# Patient Record
Sex: Female | Born: 2000 | Race: White | Hispanic: No | Marital: Single | Smoking: Never smoker
Health system: Southern US, Community
[De-identification: ages and names within clinical notes are randomized; demographics above are authoritative.]

## PROBLEM LIST (undated history)

## (undated) DIAGNOSIS — F502 Bulimia nervosa, unspecified: Secondary | ICD-10-CM

## (undated) DIAGNOSIS — E538 Deficiency of other specified B group vitamins: Secondary | ICD-10-CM

## (undated) DIAGNOSIS — F32A Depression, unspecified: Secondary | ICD-10-CM

## (undated) DIAGNOSIS — F419 Anxiety disorder, unspecified: Secondary | ICD-10-CM

## (undated) DIAGNOSIS — J45909 Unspecified asthma, uncomplicated: Secondary | ICD-10-CM

## (undated) DIAGNOSIS — R630 Anorexia: Secondary | ICD-10-CM

## (undated) DIAGNOSIS — F909 Attention-deficit hyperactivity disorder, unspecified type: Secondary | ICD-10-CM

## (undated) DIAGNOSIS — F329 Major depressive disorder, single episode, unspecified: Secondary | ICD-10-CM

## (undated) HISTORY — DX: Major depressive disorder, single episode, unspecified: F32.9

## (undated) HISTORY — DX: Unspecified asthma, uncomplicated: J45.909

## (undated) HISTORY — PX: ADENOIDECTOMY: SUR15

## (undated) HISTORY — DX: Morbid (severe) obesity due to excess calories: E66.01

## (undated) HISTORY — DX: Deficiency of other specified B group vitamins: E53.8

## (undated) HISTORY — DX: Anorexia: R63.0

## (undated) HISTORY — DX: Bulimia nervosa, unspecified: F50.20

## (undated) HISTORY — PX: TONSILLECTOMY: SUR1361

## (undated) HISTORY — DX: Attention-deficit hyperactivity disorder, unspecified type: F90.9

## (undated) HISTORY — DX: Depression, unspecified: F32.A

## (undated) HISTORY — DX: Anxiety disorder, unspecified: F41.9

## (undated) HISTORY — PX: LIGAMENT REPAIR: SHX5444

## (undated) HISTORY — DX: Bulimia nervosa: F50.2

## (undated) HISTORY — PX: BLADDER REPAIR: SHX76

---
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
## 2018-01-04 DIAGNOSIS — M545 Low back pain: Secondary | ICD-10-CM | POA: Diagnosis not present

## 2018-01-04 DIAGNOSIS — M25551 Pain in right hip: Secondary | ICD-10-CM | POA: Diagnosis not present

## 2018-01-04 DIAGNOSIS — M25552 Pain in left hip: Secondary | ICD-10-CM | POA: Diagnosis not present

## 2018-01-11 ENCOUNTER — Other Ambulatory Visit (INDEPENDENT_AMBULATORY_CARE_PROVIDER_SITE_OTHER): Payer: Self-pay | Admitting: Family Medicine

## 2018-01-11 DIAGNOSIS — M545 Low back pain: Secondary | ICD-10-CM | POA: Diagnosis not present

## 2018-01-11 DIAGNOSIS — M25552 Pain in left hip: Secondary | ICD-10-CM | POA: Diagnosis not present

## 2018-01-11 DIAGNOSIS — M25551 Pain in right hip: Secondary | ICD-10-CM | POA: Diagnosis not present

## 2018-01-15 DIAGNOSIS — M25552 Pain in left hip: Secondary | ICD-10-CM | POA: Diagnosis not present

## 2018-01-15 DIAGNOSIS — M25551 Pain in right hip: Secondary | ICD-10-CM | POA: Diagnosis not present

## 2018-01-15 DIAGNOSIS — M545 Low back pain: Secondary | ICD-10-CM | POA: Diagnosis not present

## 2018-01-18 DIAGNOSIS — F5001 Anorexia nervosa, restricting type: Secondary | ICD-10-CM | POA: Diagnosis not present

## 2018-01-19 DIAGNOSIS — M25552 Pain in left hip: Secondary | ICD-10-CM | POA: Diagnosis not present

## 2018-01-19 DIAGNOSIS — M25551 Pain in right hip: Secondary | ICD-10-CM | POA: Diagnosis not present

## 2018-01-19 DIAGNOSIS — M545 Low back pain: Secondary | ICD-10-CM | POA: Diagnosis not present

## 2018-01-21 DIAGNOSIS — M545 Low back pain: Secondary | ICD-10-CM | POA: Diagnosis not present

## 2018-01-21 DIAGNOSIS — M25552 Pain in left hip: Secondary | ICD-10-CM | POA: Diagnosis not present

## 2018-01-21 DIAGNOSIS — M25551 Pain in right hip: Secondary | ICD-10-CM | POA: Diagnosis not present

## 2018-01-21 DIAGNOSIS — Z713 Dietary counseling and surveillance: Secondary | ICD-10-CM | POA: Diagnosis not present

## 2018-01-21 DIAGNOSIS — M6281 Muscle weakness (generalized): Secondary | ICD-10-CM | POA: Diagnosis not present

## 2018-01-28 DIAGNOSIS — M6281 Muscle weakness (generalized): Secondary | ICD-10-CM | POA: Diagnosis not present

## 2018-01-28 DIAGNOSIS — M25551 Pain in right hip: Secondary | ICD-10-CM | POA: Diagnosis not present

## 2018-01-28 DIAGNOSIS — M25552 Pain in left hip: Secondary | ICD-10-CM | POA: Diagnosis not present

## 2018-01-28 DIAGNOSIS — M545 Low back pain: Secondary | ICD-10-CM | POA: Diagnosis not present

## 2018-01-29 ENCOUNTER — Telehealth: Payer: Self-pay

## 2018-01-29 NOTE — Telephone Encounter (Signed)
Patient called asking for refill of lisinopril. Pt should have 3 refills on file at the pharmacy. Called number on file. No answer, no VM option avail. If patient calls back-please relay to patient.

## 2018-02-01 DIAGNOSIS — M6281 Muscle weakness (generalized): Secondary | ICD-10-CM | POA: Diagnosis not present

## 2018-02-01 DIAGNOSIS — M545 Low back pain: Secondary | ICD-10-CM | POA: Diagnosis not present

## 2018-02-01 DIAGNOSIS — M25551 Pain in right hip: Secondary | ICD-10-CM | POA: Diagnosis not present

## 2018-02-01 DIAGNOSIS — M25552 Pain in left hip: Secondary | ICD-10-CM | POA: Diagnosis not present

## 2018-02-10 ENCOUNTER — Ambulatory Visit: Payer: BLUE CROSS/BLUE SHIELD | Admitting: Sports Medicine

## 2018-02-11 DIAGNOSIS — F5001 Anorexia nervosa, restricting type: Secondary | ICD-10-CM | POA: Diagnosis not present

## 2018-02-16 DIAGNOSIS — S90851A Superficial foreign body, right foot, initial encounter: Secondary | ICD-10-CM | POA: Diagnosis not present

## 2018-02-19 DIAGNOSIS — M6281 Muscle weakness (generalized): Secondary | ICD-10-CM | POA: Diagnosis not present

## 2018-02-19 DIAGNOSIS — M545 Low back pain: Secondary | ICD-10-CM | POA: Diagnosis not present

## 2018-02-19 DIAGNOSIS — M25552 Pain in left hip: Secondary | ICD-10-CM | POA: Diagnosis not present

## 2018-02-19 DIAGNOSIS — M25551 Pain in right hip: Secondary | ICD-10-CM | POA: Diagnosis not present

## 2018-02-19 DIAGNOSIS — F5001 Anorexia nervosa, restricting type: Secondary | ICD-10-CM | POA: Diagnosis not present

## 2018-02-23 DIAGNOSIS — Z713 Dietary counseling and surveillance: Secondary | ICD-10-CM | POA: Diagnosis not present

## 2018-02-25 DIAGNOSIS — M6281 Muscle weakness (generalized): Secondary | ICD-10-CM | POA: Diagnosis not present

## 2018-02-25 DIAGNOSIS — M25551 Pain in right hip: Secondary | ICD-10-CM | POA: Diagnosis not present

## 2018-02-25 DIAGNOSIS — M545 Low back pain: Secondary | ICD-10-CM | POA: Diagnosis not present

## 2018-02-25 DIAGNOSIS — M25552 Pain in left hip: Secondary | ICD-10-CM | POA: Diagnosis not present

## 2018-02-26 DIAGNOSIS — F5001 Anorexia nervosa, restricting type: Secondary | ICD-10-CM | POA: Diagnosis not present

## 2018-02-26 DIAGNOSIS — M545 Low back pain: Secondary | ICD-10-CM | POA: Diagnosis not present

## 2018-02-26 DIAGNOSIS — M25552 Pain in left hip: Secondary | ICD-10-CM | POA: Diagnosis not present

## 2018-02-26 DIAGNOSIS — M6281 Muscle weakness (generalized): Secondary | ICD-10-CM | POA: Diagnosis not present

## 2018-02-26 DIAGNOSIS — M25551 Pain in right hip: Secondary | ICD-10-CM | POA: Diagnosis not present

## 2018-03-02 DIAGNOSIS — M25551 Pain in right hip: Secondary | ICD-10-CM | POA: Diagnosis not present

## 2018-03-02 DIAGNOSIS — M25552 Pain in left hip: Secondary | ICD-10-CM | POA: Diagnosis not present

## 2018-03-02 DIAGNOSIS — M6281 Muscle weakness (generalized): Secondary | ICD-10-CM | POA: Diagnosis not present

## 2018-03-02 DIAGNOSIS — M545 Low back pain: Secondary | ICD-10-CM | POA: Diagnosis not present

## 2018-03-04 ENCOUNTER — Encounter: Payer: Self-pay | Admitting: Pediatrics

## 2018-03-04 ENCOUNTER — Ambulatory Visit (INDEPENDENT_AMBULATORY_CARE_PROVIDER_SITE_OTHER): Payer: BLUE CROSS/BLUE SHIELD | Admitting: Pediatrics

## 2018-03-04 VITALS — BP 144/81 | HR 93 | Ht 61.0 in | Wt 265.0 lb

## 2018-03-04 DIAGNOSIS — I10 Essential (primary) hypertension: Secondary | ICD-10-CM

## 2018-03-04 DIAGNOSIS — F902 Attention-deficit hyperactivity disorder, combined type: Secondary | ICD-10-CM | POA: Diagnosis not present

## 2018-03-04 DIAGNOSIS — F5002 Anorexia nervosa, binge eating/purging type: Secondary | ICD-10-CM

## 2018-03-04 DIAGNOSIS — N946 Dysmenorrhea, unspecified: Secondary | ICD-10-CM

## 2018-03-04 DIAGNOSIS — F4323 Adjustment disorder with mixed anxiety and depressed mood: Secondary | ICD-10-CM | POA: Diagnosis not present

## 2018-03-04 MED ORDER — BUSPIRONE HCL 5 MG PO TABS: 5.0000 mg | ORAL_TABLET | Freq: Three times a day (TID) | ORAL | 1 refills | Status: DC

## 2018-03-04 MED ORDER — METHYLPHENIDATE HCL ER (OSM) 18 MG PO TBCR: 18.0000 mg | EXTENDED_RELEASE_TABLET | Freq: Every day | ORAL | 0 refills | Status: DC

## 2018-03-04 NOTE — Patient Instructions (Addendum)
Adult ADHD Symptoms  Impulsiveness Disorganization and problems prioritizing Poor time management skills Problems focusing on a task Trouble multitasking Excessive activity or restlessness Poor planning Low frustration tolerance Frequent mood swings Problems following through and completing tasks Hot temper Trouble coping with stress  buspar 5 mg three times daily

## 2018-03-04 NOTE — Progress Notes (Addendum)
THIS RECORD MAY CONTAIN CONFIDENTIAL INFORMATION THAT SHOULD NOT BE RELEASED WITHOUT REVIEW OF THE SERVICE PROVIDER.  Adolescent Medicine Consultation Follow-Up Visit Emily [name]  is a 18 y.o. female referred by Geoffry ParadiseAronson, Richard, MD here today for follow-up regarding hypertension, ADHD, anorexia.    Plan at last adolescent specialty clinic  visit included continue lisinopril, continue concerta PRN, continue effexor daily, continue with dietician and therpist, follow with urology for chronic interstitial cystitis.  Pertinent Labs? No Growth Chart Viewed? yes   History was provided by the patient.  Interpreter? no  No chief complaint on file.  HPI:    Her eating behaviors have been up and down in the past few months but recently she has been "falling back into her old habits" of restricting. She is incredibly busy with school and work (takes classes at both [employer] and [employer] and works as Industrial/product designerbabysitter and pet sitter), and staying so busy makes it hard to take a break to eat.  24 h recall:  B: bagel with cream cheese L: skipped today, normally has 1 cup chex mix D: often later at night, sometimes overeats at this meal - ex. last night ate a lot of chicken and potatoes  She does not have much support at home regarding her eating disorder - family and friends have their own issues and she feels alone with this burden. Is grateful for nutrition, therapist and adolescent clinic who have helped her.  With her busy schedule, she feels increased anxiety and low mood. Feels like all of these commitments are "piling up". She "feels like a robot" and finds herself turning down fun activities with friends because she is so busy that when she has a free minute she likes to spend it by herself. She would like to try a new medication for anxiety.  She has purged a few times in the past few months but none recently.  She is sleeping about 5-6 hours per night.  She has not been taking  lisinopril regularly because it causes heartburn when not taken regularly for her.  No LMP recorded. (Menstrual status: Oral contraceptives). Allergies  Allergen Reactions  . Dust Mite Extract Other (See Comments)    Sneezing, coughing, runny nose Sneezing, coughing, runny nose    Current Outpatient Medications on File Prior to Visit  Medication Sig Dispense Refill  . hydrOXYzine (ATARAX/VISTARIL) 25 MG tablet Take by mouth.    . levonorgestrel-ethinyl estradiol (LILLOW) 0.15-30 MG-MCG tablet TAKE 1 TABLET DAILY BY MOUTH. SKIP PLACEBO PILLS. CONTINUOUS CYCLE FOR 3 MONTHS. 112 tablet 3  . lisinopril (PRINIVIL) 10 MG tablet Take 1 tablet (10 mg total) by mouth daily. 30 tablet 3  . methylphenidate (CONCERTA) 18 MG PO CR tablet Take 1 tablet (18 mg total) by mouth daily. 30 tablet 0  . naproxen (NAPROSYN) 500 MG tablet TAKE 1 TABLET BY MOUTH TWICE A DAY AS NEEDED 60 tablet 1  . omega-3 acid ethyl esters (LOVAZA) 1 g capsule TAKE 1 CAPSULE (1 G TOTAL) BY MOUTH 2 (TWO) TIMES DAILY. 180 capsule 1  . pantoprazole (PROTONIX) 40 MG tablet Take 1 tablet (40 mg total) by mouth daily. 90 tablet 1  . traZODone (DESYREL) 50 MG tablet 1-2    . venlafaxine XR (EFFEXOR-XR) 150 MG 24 hr capsule TAKE 1 CAPSULE BY MOUTH EVERY DAY 90 capsule 1   No current facility-administered medications on file prior to visit.     Patient Active Problem List   Diagnosis Date Noted  .  Acute left-sided low back pain without sciatica 12/09/2017  . Chronic left-sided low back pain without sciatica 12/02/2017  . Interstitial cystitis (chronic) with hematuria 11/26/2017  . 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 Hypertension 03/25/2017  . Midline thoracic back pain 09/27/2015  . Acne vulgaris 09/27/2015  . Anorexia nervosa, binge-eating purging type 04/11/2015  . Dysmenorrhea 02/27/2015  . Sleep disturbance 11/18/2014  . ADHD (attention deficit hyperactivity disorder) 08/24/2014  . Adjustment disorder with mixed  anxiety and depressed mood 08/23/2014   Review of Systems  Cardiovascular: Negative for chest pain.  Gastrointestinal: Positive for heartburn. Negative for abdominal pain, constipation and diarrhea.  Genitourinary: Positive for frequency and urgency.  Neurological: Positive for headaches (likely due to lack of sleep and not eating).   Social History: Changes with school since last visit?  no  Tobacco?  no Drugs/ETOH?  no  Physical Exam:  Vitals:   03/04/18 1342  BP: (!) 144/81  Pulse: 93  Weight: 265 lb (120.2 kg)  Height: 5\' 1"  (1.549 m)   BP (!) 144/81   Pulse 93   Ht 5\' 1"  (1.549 m)   Wt 265 lb (120.2 kg)   BMI 50.07 kg/m  Body mass index: body mass index is 50.07 kg/m. Blood pressure percentiles are not available for patients who are 18 years or older.  Physical Exam Constitutional:      General: She is not in acute distress.    Appearance: Normal appearance. She is obese.  HENT:     Head: Normocephalic and atraumatic.     Nose: Nose normal.     Mouth/Throat:     Mouth: Mucous membranes are moist.     Pharynx: Oropharynx is clear.  Eyes:     Conjunctiva/sclera: Conjunctivae normal.     Pupils: Pupils are equal, round, and reactive to light.  Neck:     Musculoskeletal: Neck supple.  Cardiovascular:     Rate and Rhythm: Normal rate and regular rhythm.     Pulses: Normal pulses.     Heart sounds: No murmur.  Pulmonary:     Effort: Pulmonary effort is normal.     Breath sounds: Normal breath sounds.  Abdominal:     General: There is no distension.     Palpations: Abdomen is soft.     Tenderness: There is no abdominal tenderness.  Musculoskeletal: Normal range of motion.  Lymphadenopathy:     Cervical: No cervical adenopathy.  Skin:    General: Skin is warm.     Findings: No rash.  Neurological:     General: No focal deficit present.     Mental Status: She is alert.  Psychiatric:        Mood and Affect: Mood is anxious.     Assessment/Plan:  1.  Essential hypertension - Discussed risks of hypertension and importance of taking lisinopril regularly. Offered to change medications given her report of heartburn with lisinopril but Tilla states that when she takes it regularly she doesn't get heartburn  2. Dysmenorrhea - Continue OCPs with continuous cycling  3. Adjustment disorder with mixed anxiety and depressed mood - Continue effexor - Will start buspar to see if this helps anxiety - busPIRone (BUSPAR) 5 MG tablet; Take 1 tablet (5 mg total) by mouth 3 (three) times daily.  Dispense: 90 tablet; Refill: 1  4. Attention deficit hyperactivity disorder (ADHD), combined type - Discussed that she should take concerta more regularly since uncontrolled ADHD could contribute to some of the problems she is describing  with her schedule and anxiety - methylphenidate (CONCERTA) 18 MG PO CR tablet; Take 1 tablet (18 mg total) by mouth daily.  Dispense: 30 tablet; Refill: 0  5. Anorexia nervosa, binge-eating purging type - Has gained 10 pounds since last visit. Is restricting all day and overeating at dinner.  - Continue with nutrition and counseling   BH screenings: PHQSADS reviewed and indicated mild-moderately elevated scores. Screens discussed with patient and parent and adjustments to plan made accordingly.    Follow-up: 4 weeks  Medical decision-making:  >30 minutes spent face to face with patient with more than 50% of appointment spent discussing diagnosis, management, follow-up, and reviewing of medical record.  Randolm Idol, MD

## 2018-03-05 DIAGNOSIS — M25551 Pain in right hip: Secondary | ICD-10-CM | POA: Diagnosis not present

## 2018-03-05 DIAGNOSIS — M25552 Pain in left hip: Secondary | ICD-10-CM | POA: Diagnosis not present

## 2018-03-05 DIAGNOSIS — M6281 Muscle weakness (generalized): Secondary | ICD-10-CM | POA: Diagnosis not present

## 2018-03-05 DIAGNOSIS — M545 Low back pain: Secondary | ICD-10-CM | POA: Diagnosis not present

## 2018-03-06 ENCOUNTER — Other Ambulatory Visit: Payer: Self-pay | Admitting: Pediatrics

## 2018-03-06 DIAGNOSIS — K219 Gastro-esophageal reflux disease without esophagitis: Secondary | ICD-10-CM

## 2018-03-08 DIAGNOSIS — F5001 Anorexia nervosa, restricting type: Secondary | ICD-10-CM | POA: Diagnosis not present

## 2018-03-08 NOTE — Progress Notes (Signed)
I have reviewed the resident's note and plan of care and helped develop the plan as necessary.  Restart lisinopril. Discussed the manifestations of untreated adult ADHD and the potential benefits to her of treating it daily along with her tx for anxiety and depression. She was in agreement with restarting her concerta. Will f/u in 1 month to assess progress with eating behaviors and monitor BP.

## 2018-03-09 DIAGNOSIS — Z713 Dietary counseling and surveillance: Secondary | ICD-10-CM | POA: Diagnosis not present

## 2018-03-10 ENCOUNTER — Telehealth: Payer: Self-pay

## 2018-03-10 NOTE — Telephone Encounter (Signed)
Rec'd notification from pharmacy stating insurance no longer pays for omega-3 acid ethyl esters (LOVAZA) 1 g capsule and did not give alternative.Patient requests new RX.

## 2018-03-11 ENCOUNTER — Other Ambulatory Visit: Payer: Self-pay | Admitting: Pediatrics

## 2018-03-11 ENCOUNTER — Telehealth: Payer: Self-pay | Admitting: *Deleted

## 2018-03-11 NOTE — Telephone Encounter (Signed)
Called patient and let her know.  

## 2018-03-11 NOTE — Telephone Encounter (Signed)
No other rx alternatives... she can get OTC fish oil that has at least 1000 mg of omega 3 and take twice daily.

## 2018-03-11 NOTE — Telephone Encounter (Signed)
Called number on file, no answer, no VM option avail. Relayed message to pharmacy to make patient aware.

## 2018-03-11 NOTE — Telephone Encounter (Signed)
Patient calling for refill for protonix. Is out of medicine.

## 2018-03-11 NOTE — Telephone Encounter (Signed)
Sent to pharmacy earlier in the week- 90 with 1 refill.

## 2018-03-12 ENCOUNTER — Other Ambulatory Visit (INDEPENDENT_AMBULATORY_CARE_PROVIDER_SITE_OTHER): Payer: Self-pay | Admitting: Family Medicine

## 2018-03-12 DIAGNOSIS — M25552 Pain in left hip: Secondary | ICD-10-CM | POA: Diagnosis not present

## 2018-03-12 DIAGNOSIS — M545 Low back pain: Secondary | ICD-10-CM | POA: Diagnosis not present

## 2018-03-12 DIAGNOSIS — M6281 Muscle weakness (generalized): Secondary | ICD-10-CM | POA: Diagnosis not present

## 2018-03-12 DIAGNOSIS — M25551 Pain in right hip: Secondary | ICD-10-CM | POA: Diagnosis not present

## 2018-03-18 DIAGNOSIS — M6281 Muscle weakness (generalized): Secondary | ICD-10-CM | POA: Diagnosis not present

## 2018-03-18 DIAGNOSIS — M25551 Pain in right hip: Secondary | ICD-10-CM | POA: Diagnosis not present

## 2018-03-18 DIAGNOSIS — M545 Low back pain: Secondary | ICD-10-CM | POA: Diagnosis not present

## 2018-03-18 DIAGNOSIS — M25552 Pain in left hip: Secondary | ICD-10-CM | POA: Diagnosis not present

## 2018-03-19 ENCOUNTER — Other Ambulatory Visit: Payer: Self-pay | Admitting: Pediatrics

## 2018-03-19 DIAGNOSIS — N946 Dysmenorrhea, unspecified: Secondary | ICD-10-CM

## 2018-03-19 DIAGNOSIS — E782 Mixed hyperlipidemia: Secondary | ICD-10-CM

## 2018-03-22 DIAGNOSIS — F5001 Anorexia nervosa, restricting type: Secondary | ICD-10-CM | POA: Diagnosis not present

## 2018-03-26 ENCOUNTER — Other Ambulatory Visit: Payer: Self-pay | Admitting: Pediatrics

## 2018-03-26 DIAGNOSIS — F4323 Adjustment disorder with mixed anxiety and depressed mood: Secondary | ICD-10-CM

## 2018-04-01 ENCOUNTER — Ambulatory Visit: Payer: Self-pay | Admitting: Pediatrics

## 2018-04-01 DIAGNOSIS — M25551 Pain in right hip: Secondary | ICD-10-CM | POA: Diagnosis not present

## 2018-04-01 DIAGNOSIS — M25552 Pain in left hip: Secondary | ICD-10-CM | POA: Diagnosis not present

## 2018-04-01 DIAGNOSIS — M545 Low back pain: Secondary | ICD-10-CM | POA: Diagnosis not present

## 2018-04-01 DIAGNOSIS — M6281 Muscle weakness (generalized): Secondary | ICD-10-CM | POA: Diagnosis not present

## 2018-04-05 DIAGNOSIS — F5002 Anorexia nervosa, binge eating/purging type: Secondary | ICD-10-CM | POA: Diagnosis not present

## 2018-04-12 DIAGNOSIS — M25552 Pain in left hip: Secondary | ICD-10-CM | POA: Diagnosis not present

## 2018-04-12 DIAGNOSIS — M545 Low back pain: Secondary | ICD-10-CM | POA: Diagnosis not present

## 2018-04-12 DIAGNOSIS — M25551 Pain in right hip: Secondary | ICD-10-CM | POA: Diagnosis not present

## 2018-04-12 DIAGNOSIS — M6281 Muscle weakness (generalized): Secondary | ICD-10-CM | POA: Diagnosis not present

## 2018-04-13 DIAGNOSIS — Z713 Dietary counseling and surveillance: Secondary | ICD-10-CM | POA: Diagnosis not present

## 2018-04-13 DIAGNOSIS — F4323 Adjustment disorder with mixed anxiety and depressed mood: Secondary | ICD-10-CM | POA: Diagnosis not present

## 2018-04-19 DIAGNOSIS — F5002 Anorexia nervosa, binge eating/purging type: Secondary | ICD-10-CM | POA: Diagnosis not present

## 2018-04-20 DIAGNOSIS — F4323 Adjustment disorder with mixed anxiety and depressed mood: Secondary | ICD-10-CM | POA: Diagnosis not present

## 2018-04-27 DIAGNOSIS — F4323 Adjustment disorder with mixed anxiety and depressed mood: Secondary | ICD-10-CM | POA: Diagnosis not present

## 2018-05-03 DIAGNOSIS — F5002 Anorexia nervosa, binge eating/purging type: Secondary | ICD-10-CM | POA: Diagnosis not present

## 2018-05-04 ENCOUNTER — Other Ambulatory Visit: Payer: Self-pay | Admitting: Pediatrics

## 2018-05-04 DIAGNOSIS — I10 Essential (primary) hypertension: Secondary | ICD-10-CM

## 2018-05-10 DIAGNOSIS — Z713 Dietary counseling and surveillance: Secondary | ICD-10-CM | POA: Diagnosis not present

## 2018-05-11 ENCOUNTER — Other Ambulatory Visit (INDEPENDENT_AMBULATORY_CARE_PROVIDER_SITE_OTHER): Payer: Self-pay | Admitting: Family Medicine

## 2018-05-17 DIAGNOSIS — F5002 Anorexia nervosa, binge eating/purging type: Secondary | ICD-10-CM | POA: Diagnosis not present

## 2018-05-26 DIAGNOSIS — L02214 Cutaneous abscess of groin: Secondary | ICD-10-CM | POA: Diagnosis not present

## 2018-05-31 DIAGNOSIS — F5002 Anorexia nervosa, binge eating/purging type: Secondary | ICD-10-CM | POA: Diagnosis not present

## 2018-06-08 DIAGNOSIS — Z0000 Encounter for general adult medical examination without abnormal findings: Secondary | ICD-10-CM | POA: Diagnosis not present

## 2018-06-08 DIAGNOSIS — Z713 Dietary counseling and surveillance: Secondary | ICD-10-CM | POA: Diagnosis not present

## 2018-06-14 DIAGNOSIS — R82998 Other abnormal findings in urine: Secondary | ICD-10-CM | POA: Diagnosis not present

## 2018-06-14 DIAGNOSIS — F5000 Anorexia nervosa, unspecified: Secondary | ICD-10-CM | POA: Diagnosis not present

## 2018-06-14 DIAGNOSIS — R030 Elevated blood-pressure reading, without diagnosis of hypertension: Secondary | ICD-10-CM | POA: Diagnosis not present

## 2018-06-14 DIAGNOSIS — R7989 Other specified abnormal findings of blood chemistry: Secondary | ICD-10-CM | POA: Diagnosis not present

## 2018-06-14 DIAGNOSIS — Z0000 Encounter for general adult medical examination without abnormal findings: Secondary | ICD-10-CM | POA: Diagnosis not present

## 2018-06-14 DIAGNOSIS — R7301 Impaired fasting glucose: Secondary | ICD-10-CM | POA: Diagnosis not present

## 2018-06-14 DIAGNOSIS — F5002 Anorexia nervosa, binge eating/purging type: Secondary | ICD-10-CM | POA: Diagnosis not present

## 2018-06-14 DIAGNOSIS — Z1331 Encounter for screening for depression: Secondary | ICD-10-CM | POA: Diagnosis not present

## 2018-06-25 ENCOUNTER — Other Ambulatory Visit: Payer: Self-pay | Admitting: Pediatrics

## 2018-06-25 DIAGNOSIS — F4323 Adjustment disorder with mixed anxiety and depressed mood: Secondary | ICD-10-CM

## 2018-06-28 DIAGNOSIS — F5002 Anorexia nervosa, binge eating/purging type: Secondary | ICD-10-CM | POA: Diagnosis not present

## 2018-07-11 ENCOUNTER — Other Ambulatory Visit (INDEPENDENT_AMBULATORY_CARE_PROVIDER_SITE_OTHER): Payer: Self-pay | Admitting: Family Medicine

## 2018-07-12 DIAGNOSIS — F5002 Anorexia nervosa, binge eating/purging type: Secondary | ICD-10-CM | POA: Diagnosis not present

## 2018-07-23 DIAGNOSIS — F5002 Anorexia nervosa, binge eating/purging type: Secondary | ICD-10-CM | POA: Diagnosis not present

## 2018-08-09 DIAGNOSIS — F5002 Anorexia nervosa, binge eating/purging type: Secondary | ICD-10-CM | POA: Diagnosis not present

## 2018-08-23 DIAGNOSIS — F5002 Anorexia nervosa, binge eating/purging type: Secondary | ICD-10-CM | POA: Diagnosis not present

## 2018-09-06 DIAGNOSIS — F5002 Anorexia nervosa, binge eating/purging type: Secondary | ICD-10-CM | POA: Diagnosis not present

## 2018-09-16 ENCOUNTER — Other Ambulatory Visit: Payer: Self-pay | Admitting: Pediatrics

## 2018-09-16 DIAGNOSIS — K219 Gastro-esophageal reflux disease without esophagitis: Secondary | ICD-10-CM

## 2018-09-16 DIAGNOSIS — H04123 Dry eye syndrome of bilateral lacrimal glands: Secondary | ICD-10-CM | POA: Diagnosis not present

## 2018-09-18 ENCOUNTER — Other Ambulatory Visit: Payer: Self-pay | Admitting: Pediatrics

## 2018-09-18 DIAGNOSIS — G8929 Other chronic pain: Secondary | ICD-10-CM

## 2018-09-18 DIAGNOSIS — M545 Low back pain, unspecified: Secondary | ICD-10-CM

## 2018-09-23 DIAGNOSIS — F5002 Anorexia nervosa, binge eating/purging type: Secondary | ICD-10-CM | POA: Diagnosis not present

## 2018-10-05 DIAGNOSIS — F5002 Anorexia nervosa, binge eating/purging type: Secondary | ICD-10-CM | POA: Diagnosis not present

## 2018-10-11 ENCOUNTER — Ambulatory Visit: Payer: Self-pay | Admitting: Pediatrics

## 2018-10-14 ENCOUNTER — Encounter: Payer: Self-pay | Admitting: Pediatrics

## 2018-10-14 ENCOUNTER — Ambulatory Visit (INDEPENDENT_AMBULATORY_CARE_PROVIDER_SITE_OTHER): Payer: BC Managed Care – PPO | Admitting: Pediatrics

## 2018-10-14 ENCOUNTER — Other Ambulatory Visit: Payer: Self-pay

## 2018-10-14 VITALS — BP 126/86 | HR 114 | Ht 64.0 in | Wt 305.4 lb

## 2018-10-14 DIAGNOSIS — F4323 Adjustment disorder with mixed anxiety and depressed mood: Secondary | ICD-10-CM

## 2018-10-14 DIAGNOSIS — Z23 Encounter for immunization: Secondary | ICD-10-CM

## 2018-10-14 DIAGNOSIS — M545 Low back pain, unspecified: Secondary | ICD-10-CM

## 2018-10-14 DIAGNOSIS — I10 Essential (primary) hypertension: Secondary | ICD-10-CM | POA: Diagnosis not present

## 2018-10-14 DIAGNOSIS — F902 Attention-deficit hyperactivity disorder, combined type: Secondary | ICD-10-CM | POA: Diagnosis not present

## 2018-10-14 DIAGNOSIS — R5382 Chronic fatigue, unspecified: Secondary | ICD-10-CM | POA: Diagnosis not present

## 2018-10-14 DIAGNOSIS — G8929 Other chronic pain: Secondary | ICD-10-CM

## 2018-10-14 DIAGNOSIS — F5002 Anorexia nervosa, binge eating/purging type: Secondary | ICD-10-CM

## 2018-10-14 DIAGNOSIS — Z1389 Encounter for screening for other disorder: Secondary | ICD-10-CM | POA: Diagnosis not present

## 2018-10-14 LAB — POCT URINALYSIS DIPSTICK
Bilirubin, UA: NEGATIVE
Glucose, UA: NEGATIVE
Ketones, UA: NEGATIVE
Leukocytes, UA: NEGATIVE
Nitrite, UA: NEGATIVE
Protein, UA: POSITIVE — AB
Spec Grav, UA: 1.015 (ref 1.010–1.025)
Urobilinogen, UA: NEGATIVE E.U./dL — AB
pH, UA: 5 (ref 5.0–8.0)

## 2018-10-14 MED ORDER — METHYLPREDNISOLONE 4 MG PO TBPK: ORAL_TABLET | ORAL | 0 refills | Status: DC

## 2018-10-14 MED ORDER — LISINOPRIL 20 MG PO TABS: 20.0000 mg | ORAL_TABLET | Freq: Every day | ORAL | 1 refills | Status: DC

## 2018-10-14 MED ORDER — CYCLOBENZAPRINE HCL 10 MG PO TABS: 10.0000 mg | ORAL_TABLET | Freq: Three times a day (TID) | ORAL | 0 refills | Status: DC | PRN

## 2018-10-14 NOTE — Patient Instructions (Addendum)
Medrol dosepak:  Day 1: 24 mg on day 1 administered as 8 mg (2 tablets) before breakfast, 4 mg (1 tablet) after lunch, 4 mg (1 tablet) after supper, and 8 mg (2 tablets) at bedtime or 24 mg (6 tablets) as a single dose or divided into 2 or 3 doses upon initiation (regardless of time of day). Day 2: 20 mg on day 2 administered as 4 mg (1 tablet) before breakfast, 4 mg (1 tablet) after lunch, 4 mg (1 tablet) after supper, and 8 mg (2 tablets) at bedtime. Day 3: 16 mg on day 3 administered as 4 mg (1 tablet) before breakfast, 4 mg (1 tablet) after lunch, 4 mg (1 tablet) after supper, and 4 mg (1 tablet) at bedtime. Day 4: 12 mg on day 4 administered as 4 mg (1 tablet) before breakfast, 4 mg (1 tablet) after lunch, and 4 mg (1 tablet) at bedtime. Day 5: 8 mg on day 5 administered as 4 mg (1 tablet) before breakfast and 4 mg (1 tablet) at bedtime. Day 6: 4 mg on day 6 administered as 4 mg (1 tablet) before breakfast.  Flexeril 10 mg as needed at bedtime   Increase lisinopril to 20 mg daily   Labs today- I will send you a mychart message or schedule a virtual visit for follow up of them

## 2018-10-14 NOTE — Progress Notes (Signed)
THIS RECORD MAY CONTAIN CONFIDENTIAL INFORMATION THAT SHOULD NOT BE RELEASED WITHOUT REVIEW OF THE SERVICE PROVIDER.  Adolescent Medicine Consultation Follow-Up Visit Emily [name]  is a 18 y.o. female referred by Geoffry Paradise, MD here today for follow-up regarding anorexia, mdd, anxiety, hypertension   Plan at last adolescent specialty clinic  visit included continue same plan.  Pertinent Labs? No Growth Chart Viewed? yes   History was provided by the patient.  Interpreter? no  Chief Complaint  Patient presents with  . Follow-up    DE w/o EVS    HPI:   PCP Confirmed?  yes  My Chart Activated?   yes   Missed her appt back in March. Rescheduled.   She reports she is doing "ok"- at first during pandemic she was doing ok but is struggling more now. She reports she is becoming more agorophobic- doesn't want to leave her house for anything.   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 Initially it was very challenging and she was having a lot of back pain with walking, but she feels that her core strength has improved and her endurance is better.   Most other time she is sitting on her couch for 8 hours a day doing schoolwork because she took lots of extra courses. She is still at [employer] but still living at home. Majoring in history.   She was moving a lot more over the summer but since school has started she hasn't been.   Seeing Vernona Rieger- has an appointment coming up on the 15th. Still seeing B Debney.   Last dose of lisinopril was last night.  Has a left sided pain that happens if she bends down. It is if she moves a certain way. Denies any concerns otherwise.   Chronically tired. Needs to take naps daily. Even when she was at the beach this summer with a friend she could not keep up with all the things she wanted to do. She is not fueling her body until after noontime at this point, which is not helping.   Review of Systems  Constitutional:  Negative for malaise/fatigue.  Eyes: Negative for double vision.  Respiratory: Negative for shortness of breath.   Cardiovascular: Negative for chest pain and palpitations.  Gastrointestinal: Negative for abdominal pain, constipation, diarrhea, nausea and vomiting.  Genitourinary: Negative for dysuria.  Musculoskeletal: Negative for joint pain and myalgias.  Skin: Negative for rash.  Neurological: Negative for dizziness and headaches.  Endo/Heme/Allergies: Does not bruise/bleed easily.  Psychiatric/Behavioral: Positive for depression. The patient is nervous/anxious. The patient does not have insomnia.      No LMP recorded. (Menstrual status: Oral contraceptives). Allergies  Allergen Reactions  . Dust Mite Extract Other (See Comments)    Sneezing, coughing, runny nose Sneezing, coughing, runny nose    Current Outpatient Medications on File Prior to Visit  Medication Sig Dispense Refill  . hydrOXYzine (ATARAX/VISTARIL) 25 MG tablet Take by mouth.    Marland Kitchen LILLOW 0.15-30 MG-MCG tablet TAKE 1 TABLET DAILY BY MOUTH. SKIP PLACEBO PILLS. CONTINUOUS CYCLE FOR 3 MONTHS. 112 tablet 3  . methylphenidate (CONCERTA) 18 MG PO CR tablet Take 1 tablet (18 mg total) by mouth daily. 30 tablet 0  . naproxen (NAPROSYN) 500 MG tablet TAKE 1 TABLET BY MOUTH TWICE A DAY AS NEEDED 60 tablet 1  . omega-3 acid ethyl esters (LOVAZA) 1 g capsule TAKE 1 CAPSULE (1 G TOTAL) BY MOUTH 2 (TWO) TIMES DAILY. 180 capsule 1  .  pantoprazole (PROTONIX) 40 MG tablet TAKE 1 TABLET BY MOUTH EVERY DAY 90 tablet 1  . venlafaxine XR (EFFEXOR-XR) 150 MG 24 hr capsule TAKE 1 CAPSULE BY MOUTH EVERY DAY 90 capsule 1  . doxycycline (VIBRA-TABS) 100 MG tablet TAKE 1 TABLET BY MOUTH TWICE A DAY FOR 7 DAYS    . loteprednol (LOTEMAX) 0.5 % ophthalmic suspension INSTILL 1 DROP INTO BOTH EYES 4 TIMES A DAY AS DIRECTED    . [DISCONTINUED] venlafaxine XR (EFFEXOR-XR) 150 MG 24 hr capsule TAKE 1 CAPSULE BY MOUTH EVERY DAY 90 capsule 1   No  current facility-administered medications on file prior to visit.     Patient Active Problem List   Diagnosis Date Noted  . Acute left-sided low back pain without sciatica 12/09/2017  . Chronic left-sided low back pain without sciatica 12/02/2017  . Interstitial cystitis (chronic) with hematuria 11/26/2017  . 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 Hypertension 03/25/2017  . Midline thoracic back pain 09/27/2015  . Acne vulgaris 09/27/2015  . Anorexia nervosa, binge-eating purging type 04/11/2015  . Dysmenorrhea 02/27/2015  . Sleep disturbance 11/18/2014  . ADHD (attention deficit hyperactivity disorder) 08/24/2014  . Adjustment disorder with mixed anxiety and depressed mood 08/23/2014    The following portions of the patient's history were reviewed and updated as appropriate: allergies, current medications, past family history, past medical history, past social history, past surgical history and problem list.  Physical Exam:  Vitals:   10/14/18 0917 10/14/18 0953  BP: (!) 161/89 126/86  Pulse: (!) 114   Weight: (!) 305 lb 6.4 oz (138.5 kg)   Height: 5\' 4"  (1.626 m)    BP 126/86   Pulse (!) 114   Ht 5\' 4"  (1.626 m)   Wt (!) 305 lb 6.4 oz (138.5 kg)   BMI 52.42 kg/m  Body mass index: body mass index is 52.42 kg/m. Growth percentile SmartLinks can only be used for patients less than 267 years old.   Physical Exam Vitals signs and nursing note reviewed.  Constitutional:      General: She is not in acute distress.    Appearance: She is well-developed.  Neck:     Thyroid: No thyromegaly.     Vascular: No carotid bruit.  Cardiovascular:     Rate and Rhythm: Normal rate and regular rhythm.     Heart sounds: No murmur.  Pulmonary:     Breath sounds: Normal breath sounds.  Abdominal:     Palpations: Abdomen is soft. There is no mass.     Tenderness: There is no abdominal tenderness. There is no guarding.  Musculoskeletal:     Right lower leg: No edema.     Left lower  leg: No edema.  Lymphadenopathy:     Cervical: No cervical adenopathy.  Skin:    General: Skin is warm.     Findings: No rash.  Neurological:     Mental Status: She is alert.     Motor: Tremor present.  Psychiatric:        Mood and Affect: Mood normal.        Judgment: Judgment normal.     Assessment/Plan: 1. Adjustment disorder with mixed anxiety and depressed mood Continue effexor and vistaril as needed.   2. Attention deficit hyperactivity disorder (ADHD), combined type Not currently taking concerta but reports no concerns with getting schoolwork done at home.   3. Screening for genitourinary condition + protein, blood. Has known IC, but will send for pro/creat given HTN.  -  POCT urinalysis dipstick  4. Need for vaccination Flu vaccine today.  - Flu Vaccine QUAD 36+ mos IM  5. Essential hypertension BP continues to be elevated. Will increase lisinopril to 20 mg daily for better BP control and renal protection long term.  - Protein / creatinine ratio, urine  6. Anorexia nervosa, binge-eating purging type Has gained 40 lb since last visit. Describes a very sedentary lifestyle with poor food quality. We discussed this today. She is back to see dietitian later in the month.   7. Chronic left-sided low back pain without sciatica Will do medrol dosepak per patient's request and flexeril at bedtime PRN. This worked for her previously when she saw ortho. We also discussed activity and core strength.  - cyclobenzaprine (FLEXERIL) 10 MG tablet; Take 1 tablet (10 mg total) by mouth 3 (three) times daily as needed for muscle spasms.  Dispense: 30 tablet; Refill: 0 - methylPREDNISolone (MEDROL DOSEPAK) 4 MG TBPK tablet; Take as directed on package  Dispense: 21 tablet; Refill: 0  8. Chronic fatigue Will get baseline labs since it has been a while. - Comprehensive metabolic panel - Magnesium - Phosphorus - Vitamin D (25 hydroxy) - B12 and Folate Panel - Hemoglobin A1c - CBC  with Differential/Platelet - TSH - T4, free - CK (Creatine Kinase)   BH screenings:  PHQSADs reviewed and indicated good anxiety and depression control. Screens discussed with patient and parent and adjustments to plan made accordingly.   Follow-up:  Pending labs   Medical decision-making:  >25 minutes spent face to face with patient with more than 50% of appointment spent discussing diagnosis, management, follow-up, and reviewing of anxiety, depression, adhd, back pain, htn, anorexia.

## 2018-10-15 LAB — COMPREHENSIVE METABOLIC PANEL
AG Ratio: 1.7 (calc) (ref 1.0–2.5)
ALT: 15 U/L (ref 6–29)
AST: 15 U/L (ref 10–30)
Albumin: 3.8 g/dL (ref 3.6–5.1)
Alkaline phosphatase (APISO): 38 U/L (ref 31–125)
BUN: 17 mg/dL (ref 7–25)
CO2: 22 mmol/L (ref 20–32)
Calcium: 9.1 mg/dL (ref 8.6–10.2)
Chloride: 106 mmol/L (ref 98–110)
Creat: 0.6 mg/dL (ref 0.50–1.10)
Globulin: 2.3 g/dL (calc) (ref 1.9–3.7)
Glucose, Bld: 134 mg/dL — ABNORMAL HIGH (ref 65–99)
Potassium: 4.1 mmol/L (ref 3.5–5.3)
Sodium: 138 mmol/L (ref 135–146)
Total Bilirubin: 0.2 mg/dL (ref 0.2–1.2)
Total Protein: 6.1 g/dL (ref 6.1–8.1)

## 2018-10-15 LAB — CBC WITH DIFFERENTIAL/PLATELET
Absolute Monocytes: 945 cells/uL (ref 200–950)
Basophils Absolute: 81 cells/uL (ref 0–200)
Basophils Relative: 0.9 %
Eosinophils Absolute: 261 cells/uL (ref 15–500)
Eosinophils Relative: 2.9 %
HCT: 40.7 % (ref 35.0–45.0)
Hemoglobin: 13.2 g/dL (ref 11.7–15.5)
Lymphs Abs: 3726 cells/uL (ref 850–3900)
MCH: 30.2 pg (ref 27.0–33.0)
MCHC: 32.4 g/dL (ref 32.0–36.0)
MCV: 93.1 fL (ref 80.0–100.0)
MPV: 10.1 fL (ref 7.5–12.5)
Monocytes Relative: 10.5 %
Neutro Abs: 3987 cells/uL (ref 1500–7800)
Neutrophils Relative %: 44.3 %
Platelets: 403 10*3/uL — ABNORMAL HIGH (ref 140–400)
RBC: 4.37 10*6/uL (ref 3.80–5.10)
RDW: 13.3 % (ref 11.0–15.0)
Total Lymphocyte: 41.4 %
WBC: 9 10*3/uL (ref 3.8–10.8)

## 2018-10-15 LAB — B12 AND FOLATE PANEL
Folate: 13 ng/mL
Vitamin B-12: 241 pg/mL (ref 200–1100)

## 2018-10-15 LAB — PROTEIN / CREATININE RATIO, URINE
Creatinine, Urine: 153 mg/dL (ref 20–275)
Protein/Creat Ratio: 111 mg/g creat (ref 21–161)
Protein/Creatinine Ratio: 0.111 mg/mg creat (ref 0.021–0.16)
Total Protein, Urine: 17 mg/dL (ref 5–24)

## 2018-10-15 LAB — LIPID PANEL
Cholesterol: 226 mg/dL — ABNORMAL HIGH (ref <200)
HDL: 57 mg/dL (ref >=50)
LDL Cholesterol (Calc): 132 mg/dL (calc) — ABNORMAL HIGH
Non-HDL Cholesterol (Calc): 169 mg/dL (calc) — ABNORMAL HIGH (ref <130)
Total CHOL/HDL Ratio: 4 (calc) (ref <5.0)
Triglycerides: 232 mg/dL — ABNORMAL HIGH (ref <150)

## 2018-10-15 LAB — T4, FREE: Free T4: 1 ng/dL (ref 0.8–1.4)

## 2018-10-15 LAB — HEMOGLOBIN A1C
Hgb A1c MFr Bld: 5.5 % of total Hgb (ref <5.7)
Mean Plasma Glucose: 111 (calc)
eAG (mmol/L): 6.2 (calc)

## 2018-10-15 LAB — VITAMIN D 25 HYDROXY (VIT D DEFICIENCY, FRACTURES): Vit D, 25-Hydroxy: 25 ng/mL — ABNORMAL LOW (ref 30–100)

## 2018-10-15 LAB — TSH: TSH: 4.91 mIU/L — ABNORMAL HIGH

## 2018-10-15 LAB — MAGNESIUM: Magnesium: 1.9 mg/dL (ref 1.5–2.5)

## 2018-10-15 LAB — CK: Total CK: 132 U/L (ref 29–143)

## 2018-10-15 LAB — PHOSPHORUS: Phosphorus: 3.4 mg/dL (ref 2.5–4.5)

## 2018-10-18 ENCOUNTER — Other Ambulatory Visit: Payer: Self-pay | Admitting: Pediatrics

## 2018-10-18 MED ORDER — CYANOCOBALAMIN 1000 MCG/ML IJ SOLN: 1000.0000 ug | INTRAMUSCULAR | 0 refills | Status: DC

## 2018-10-19 ENCOUNTER — Telehealth: Payer: Self-pay

## 2018-10-19 DIAGNOSIS — F5002 Anorexia nervosa, binge eating/purging type: Secondary | ICD-10-CM | POA: Diagnosis not present

## 2018-10-19 NOTE — Telephone Encounter (Signed)
Pt called asking for results. Pt gave verbal consent for results to be left over the phone. Will call patient on 10/7 with results.

## 2018-10-20 NOTE — Telephone Encounter (Signed)
Called number on file, no answer, left VM with lab results. Asked patient to call office at her convenience and leave message when is a convenient time for her once weekly injections. Awaiting call.

## 2018-10-28 DIAGNOSIS — Z713 Dietary counseling and surveillance: Secondary | ICD-10-CM | POA: Diagnosis not present

## 2018-10-29 ENCOUNTER — Ambulatory Visit: Payer: Self-pay

## 2018-11-02 DIAGNOSIS — F5002 Anorexia nervosa, binge eating/purging type: Secondary | ICD-10-CM | POA: Diagnosis not present

## 2018-11-09 ENCOUNTER — Other Ambulatory Visit: Payer: Self-pay | Admitting: Pediatrics

## 2018-11-16 DIAGNOSIS — F5002 Anorexia nervosa, binge eating/purging type: Secondary | ICD-10-CM | POA: Diagnosis not present

## 2018-11-20 ENCOUNTER — Other Ambulatory Visit: Payer: Self-pay | Admitting: Pediatrics

## 2018-11-20 DIAGNOSIS — I10 Essential (primary) hypertension: Secondary | ICD-10-CM

## 2018-11-23 ENCOUNTER — Other Ambulatory Visit: Payer: Self-pay | Admitting: Family

## 2018-11-30 DIAGNOSIS — F5002 Anorexia nervosa, binge eating/purging type: Secondary | ICD-10-CM | POA: Diagnosis not present

## 2018-12-01 ENCOUNTER — Telehealth: Payer: Self-pay

## 2018-12-01 NOTE — Telephone Encounter (Signed)
Pt called asking for contact information for [employer] to discuss something needed for school. Please schedule virtual visit with Emily [name] to discuss needs further.

## 2018-12-03 DIAGNOSIS — F5002 Anorexia nervosa, binge eating/purging type: Secondary | ICD-10-CM | POA: Diagnosis not present

## 2018-12-06 ENCOUNTER — Other Ambulatory Visit: Payer: Self-pay | Admitting: Pediatrics

## 2018-12-06 DIAGNOSIS — I10 Essential (primary) hypertension: Secondary | ICD-10-CM

## 2018-12-08 DIAGNOSIS — Z713 Dietary counseling and surveillance: Secondary | ICD-10-CM | POA: Diagnosis not present

## 2018-12-14 DIAGNOSIS — F5002 Anorexia nervosa, binge eating/purging type: Secondary | ICD-10-CM | POA: Diagnosis not present

## 2018-12-22 DIAGNOSIS — E538 Deficiency of other specified B group vitamins: Secondary | ICD-10-CM | POA: Diagnosis not present

## 2018-12-22 DIAGNOSIS — Z713 Dietary counseling and surveillance: Secondary | ICD-10-CM | POA: Diagnosis not present

## 2018-12-22 DIAGNOSIS — F5000 Anorexia nervosa, unspecified: Secondary | ICD-10-CM | POA: Diagnosis not present

## 2018-12-22 DIAGNOSIS — R7301 Impaired fasting glucose: Secondary | ICD-10-CM | POA: Diagnosis not present

## 2018-12-28 ENCOUNTER — Other Ambulatory Visit: Payer: Self-pay | Admitting: Pediatrics

## 2018-12-28 DIAGNOSIS — F4323 Adjustment disorder with mixed anxiety and depressed mood: Secondary | ICD-10-CM

## 2018-12-31 ENCOUNTER — Telehealth: Payer: Self-pay

## 2018-12-31 NOTE — Telephone Encounter (Signed)
Called number on file, no answer, left VM to call office back for serum lab draw.

## 2018-12-31 NOTE — Telephone Encounter (Signed)
She just needs to be given b12 injection. Had already had labs.

## 2018-12-31 NOTE — Telephone Encounter (Signed)
Can we put her on lab schedule?

## 2018-12-31 NOTE — Telephone Encounter (Signed)
Mom left a vm stating that the patient has not started with the injections due her and the nurses schedule (b12 shots). Mom would like to know if they are able to get the syringes and she'd give the injections herself. Mom: [place]: [phone]

## 2019-01-03 NOTE — Telephone Encounter (Signed)
Called number on file, no answer, unable to leave VM. Patient needs to be rescheduled from 12/24 RN not in office that day. Serum labs not needed. RN can administer and teach injection at appointment so she can give at home.

## 2019-01-03 NOTE — Telephone Encounter (Signed)
Called number on file, no answer, left VM to call office back. If patient calls back- please schedule nurse visit for B12 injection. Have patient go to pharmacy and pick up B13 for nurse visit.

## 2019-01-03 NOTE — Telephone Encounter (Signed)
Appointment scheduled for Thursday at 9:15. Patient continues to ask if she can administer at home.  She would also like to know what the serum blood work is for.

## 2019-01-05 DIAGNOSIS — F5002 Anorexia nervosa, binge eating/purging type: Secondary | ICD-10-CM | POA: Diagnosis not present

## 2019-01-06 ENCOUNTER — Ambulatory Visit: Payer: BC Managed Care – PPO

## 2019-01-17 DIAGNOSIS — Z713 Dietary counseling and surveillance: Secondary | ICD-10-CM | POA: Diagnosis not present

## 2019-01-18 DIAGNOSIS — F5002 Anorexia nervosa, binge eating/purging type: Secondary | ICD-10-CM | POA: Diagnosis not present

## 2019-01-18 NOTE — Telephone Encounter (Signed)
Called and left VM saying if can call clinic to r/s nurse visit.

## 2019-02-01 DIAGNOSIS — F5002 Anorexia nervosa, binge eating/purging type: Secondary | ICD-10-CM | POA: Diagnosis not present

## 2019-02-02 DIAGNOSIS — Z713 Dietary counseling and surveillance: Secondary | ICD-10-CM | POA: Diagnosis not present

## 2019-02-15 DIAGNOSIS — F5002 Anorexia nervosa, binge eating/purging type: Secondary | ICD-10-CM | POA: Diagnosis not present

## 2019-02-16 DIAGNOSIS — Z713 Dietary counseling and surveillance: Secondary | ICD-10-CM | POA: Diagnosis not present

## 2019-03-01 DIAGNOSIS — F5002 Anorexia nervosa, binge eating/purging type: Secondary | ICD-10-CM | POA: Diagnosis not present

## 2019-03-02 DIAGNOSIS — Z713 Dietary counseling and surveillance: Secondary | ICD-10-CM | POA: Diagnosis not present

## 2019-03-15 DIAGNOSIS — F5002 Anorexia nervosa, binge eating/purging type: Secondary | ICD-10-CM | POA: Diagnosis not present

## 2019-03-16 DIAGNOSIS — Z713 Dietary counseling and surveillance: Secondary | ICD-10-CM | POA: Diagnosis not present

## 2019-03-22 DIAGNOSIS — N3010 Interstitial cystitis (chronic) without hematuria: Secondary | ICD-10-CM | POA: Diagnosis not present

## 2019-03-24 ENCOUNTER — Ambulatory Visit: Payer: Self-pay

## 2019-03-28 ENCOUNTER — Other Ambulatory Visit: Payer: Self-pay | Admitting: Family

## 2019-03-28 DIAGNOSIS — K219 Gastro-esophageal reflux disease without esophagitis: Secondary | ICD-10-CM

## 2019-03-29 ENCOUNTER — Other Ambulatory Visit: Payer: Self-pay | Admitting: Pediatrics

## 2019-03-29 DIAGNOSIS — M545 Low back pain, unspecified: Secondary | ICD-10-CM

## 2019-03-29 DIAGNOSIS — F5002 Anorexia nervosa, binge eating/purging type: Secondary | ICD-10-CM | POA: Diagnosis not present

## 2019-03-29 DIAGNOSIS — G8929 Other chronic pain: Secondary | ICD-10-CM

## 2019-03-30 DIAGNOSIS — Z713 Dietary counseling and surveillance: Secondary | ICD-10-CM | POA: Diagnosis not present

## 2019-04-08 ENCOUNTER — Other Ambulatory Visit: Payer: Self-pay | Admitting: Pediatrics

## 2019-04-08 ENCOUNTER — Ambulatory Visit: Payer: BLUE CROSS/BLUE SHIELD | Admitting: Orthopaedic Surgery

## 2019-04-08 ENCOUNTER — Encounter: Payer: Self-pay | Admitting: Orthopaedic Surgery

## 2019-04-08 ENCOUNTER — Ambulatory Visit: Payer: Self-pay

## 2019-04-08 ENCOUNTER — Other Ambulatory Visit: Payer: Self-pay

## 2019-04-08 VITALS — Ht 62.0 in | Wt 313.0 lb

## 2019-04-08 DIAGNOSIS — M25571 Pain in right ankle and joints of right foot: Secondary | ICD-10-CM

## 2019-04-08 DIAGNOSIS — N946 Dysmenorrhea, unspecified: Secondary | ICD-10-CM

## 2019-04-08 NOTE — Progress Notes (Signed)
Office Visit Note   Patient: Emily [name]           Date of Birth: 01-20-2000           MRN: [id number] Visit Date: 04/08/2019              Requested by: Geoffry Paradise, MD [address] [place],  [place] [address] PCP: Geoffry Paradise, MD   Assessment & Plan: Visit Diagnoses:  1. Pain in right ankle and joints of right foot     Plan: We will set patient up with some physical therapy for evaluation and treatment with previous history of ankle sprain 2 years ago and some residual weakness.  We will check her back again in 7 weeks and she is having persistent symptoms we will consider a diagnostic MRI imaging of her ankle.  She does have a area of mild lytic changes in the distal fibula which appears chronic.  Follow-Up Instructions: Return in about 7 weeks (around 05/27/2019).   Orders:  Orders Placed This Encounter  Procedures  . XR Ankle Complete Right   No orders of the defined types were placed in this encounter.     Procedures: No procedures performed   Clinical Data: No additional findings.   Subjective: Chief Complaint  Patient presents with  . Right Ankle - Pain    HPI 19 year old female seen with some right ankle lateral pain x2 years.  She states she twisted her ankle 2 years ago laterally.  Since that time she states her ankle feels like it can roll easily.  She thinks it may not a properly healed she did not seek treatment she is used ice elevation never used brace she states she has had some plantar fasciitis and thinks maybe it may be swollen off and on.  She has increased BMI but does not want to see the scales when her weight is checked.  She is a Archivist.  Past history with a hypertension ADHD adjustment disorder.  Review of Systems 14 systems review positive for problems with increased BMI anorexia back pain dysmenorrhea hypertension back pain.   Objective: Vital Signs: Ht 5\' 2"  (1.575 m)   Wt (!) 313 lb (142 kg)   BMI 57.25  kg/m   Physical Exam Constitutional:      Appearance: She is well-developed.     Comments: Patient is alert pleasant cooperative and conversant.  HENT:     Head: Normocephalic.     Right Ear: External ear normal.     Left Ear: External ear normal.  Eyes:     Pupils: Pupils are equal, round, and reactive to light.  Neck:     Thyroid: No thyromegaly.     Trachea: No tracheal deviation.  Cardiovascular:     Rate and Rhythm: Normal rate.  Pulmonary:     Effort: Pulmonary effort is normal.  Abdominal:     Palpations: Abdomen is soft.  Skin:    General: Skin is warm and dry.  Neurological:     Mental Status: She is alert and oriented to person, place, and time.  Psychiatric:        Behavior: Behavior normal.     Ortho Exam patient is able to heel walk she has some pain with toe walking.  She may have trace ankle swelling she has some tenderness over the anterior talar fib and can calcaneal fibular ligament region without positive anterior drawer.  Peroneals are strong right left knee and ankle jerk are intact  negative straight leg raising no sciatic notch tenderness negative logroll to the hips.  Pulses are normal.  Peroneal strength is good.  No peroneal subluxation.  Specialty Comments:  No specialty comments available.  Imaging: No results found.   PMFS History: Patient Active Problem List   Diagnosis Date Noted  . Acute left-sided low back pain without sciatica 12/09/2017  . Chronic left-sided low back pain without sciatica 12/02/2017  . Interstitial cystitis (chronic) with hematuria 11/26/2017  . Partially duplicated ureter 82/42/3536  . Hypertension 03/25/2017  . Midline thoracic back pain 09/27/2015  . Acne vulgaris 09/27/2015  . Anorexia nervosa, binge-eating purging type 04/11/2015  . Dysmenorrhea 02/27/2015  . Sleep disturbance 11/18/2014  . ADHD (attention deficit hyperactivity disorder) 08/24/2014  . Adjustment disorder with mixed anxiety and depressed mood  08/23/2014   Past Medical History:  Diagnosis Date  . ADHD (attention deficit hyperactivity disorder)   . Asthma     Family History  Problem Relation Age of Onset  . Anxiety disorder Mother   . Hyperlipidemia Maternal Grandmother   . Hypertension Maternal Grandmother   . Hyperlipidemia Maternal Grandfather   . Hypertension Maternal Grandfather   . Drug abuse Father     Past Surgical History:  Procedure Laterality Date  . ADENOIDECTOMY    . TONSILLECTOMY     Social History   Occupational History  . Not on file  Tobacco Use  . Smoking status: Passive Smoke Exposure - Never Smoker  . Smokeless tobacco: Never Used  . Tobacco comment: mother smokes outside home  Substance and Sexual Activity  . Alcohol use: Yes    Alcohol/week: 0.0 standard drinks    Comment: occasional  . Drug use: No  . Sexual activity: Never    Partners: Male    Comment: Female partners

## 2019-04-15 DIAGNOSIS — F5002 Anorexia nervosa, binge eating/purging type: Secondary | ICD-10-CM | POA: Diagnosis not present

## 2019-04-18 ENCOUNTER — Telehealth: Payer: Self-pay | Admitting: Orthopaedic Surgery

## 2019-04-18 DIAGNOSIS — M25571 Pain in right ankle and joints of right foot: Secondary | ICD-10-CM

## 2019-04-18 NOTE — Telephone Encounter (Signed)
I left voicemail for patient. No referral was entered at time of visit, however, Dr. Ophelia Charter did mention it in his note. I have entered referral and explained someone from PT should call her to schedule. I did ask for patient to return call if she has any questions.

## 2019-04-18 NOTE — Telephone Encounter (Signed)
Patient called to request updates about physical therapy. Please give patient a call back. Patient phone number is [phone]

## 2019-04-20 ENCOUNTER — Ambulatory Visit: Payer: BC Managed Care – PPO | Admitting: Rehabilitative and Restorative Service Providers"

## 2019-04-24 ENCOUNTER — Other Ambulatory Visit: Payer: Self-pay | Admitting: Pediatrics

## 2019-04-24 DIAGNOSIS — E782 Mixed hyperlipidemia: Secondary | ICD-10-CM

## 2019-04-25 ENCOUNTER — Encounter: Payer: Self-pay | Admitting: Pediatrics

## 2019-04-25 ENCOUNTER — Other Ambulatory Visit: Payer: Self-pay

## 2019-04-25 ENCOUNTER — Ambulatory Visit (INDEPENDENT_AMBULATORY_CARE_PROVIDER_SITE_OTHER): Payer: BC Managed Care – PPO | Admitting: Pediatrics

## 2019-04-25 VITALS — BP 114/72 | HR 103 | Ht 63.39 in | Wt 311.0 lb

## 2019-04-25 DIAGNOSIS — G479 Sleep disorder, unspecified: Secondary | ICD-10-CM | POA: Diagnosis not present

## 2019-04-25 DIAGNOSIS — F902 Attention-deficit hyperactivity disorder, combined type: Secondary | ICD-10-CM

## 2019-04-25 DIAGNOSIS — F5002 Anorexia nervosa, binge eating/purging type: Secondary | ICD-10-CM

## 2019-04-25 DIAGNOSIS — F4323 Adjustment disorder with mixed anxiety and depressed mood: Secondary | ICD-10-CM | POA: Diagnosis not present

## 2019-04-25 DIAGNOSIS — E559 Vitamin D deficiency, unspecified: Secondary | ICD-10-CM

## 2019-04-25 DIAGNOSIS — I10 Essential (primary) hypertension: Secondary | ICD-10-CM

## 2019-04-25 DIAGNOSIS — R5382 Chronic fatigue, unspecified: Secondary | ICD-10-CM

## 2019-04-25 MED ORDER — L-METHYLFOLATE-B6-B12 3-35-2 MG PO TABS: 1.0000 | ORAL_TABLET | Freq: Every day | ORAL | 1 refills | Status: DC

## 2019-04-25 MED ORDER — VITAMIN D-3 125 MCG (5000 UT) PO TABS: 1.0000 | ORAL_TABLET | Freq: Every day | ORAL | 1 refills | Status: DC

## 2019-04-25 MED ORDER — METHYLPHENIDATE HCL ER (OSM) 27 MG PO TBCR: 27.0000 mg | EXTENDED_RELEASE_TABLET | Freq: Every day | ORAL | 0 refills | Status: DC

## 2019-04-25 NOTE — Progress Notes (Signed)
History was provided by the patient.  Emily [name] is a 19 y.o. female who is here for follow up, ADHD and mood.Marland Kitchen   PCP confirmed? Yes.    Emily Paradise, MD  HPI:   Emily [name] is a 18yo with ADHD, adjustment disorder, AN-BP, HTN, and chronic fatigue, here for follow up. Had appt a month ago, and she just couldn't will herself out of bed.  No motivation. +guilt. "I can't give anyone the time of day." More irritable towards friends. "I'm cared for financially, but mom is cold." "I've learned to manage with my ADHD, taking concerta."  Feels like she can buckle down. Didn't take it 2nd semester freshman year and sophomore year. Last time took concerta 18mg  was summer 2020.  Did endorse increased binging since her dog died two weeks ago.  Sleep: fall asleep around 5AM. Tired, but body wouldn't let her go to sleep. Then fall back asleep until 10AM. Will listen to calming podcast. Eye doctor asked her to do a heated eye mask. Lately has been falling asleep by midnight, wake up by 6am. Has only taken trazodone 3 times over the month.  Exercise: dog walking job each day. Have been able to increase physical activity. Recently went to [place] and visited [place]. Had a great time outdoors.   Review of Systems  Constitutional: Positive for malaise/fatigue. Negative for fever and weight loss.  HENT: Negative.   Eyes:       Eye dryness  Respiratory: Negative for cough.   Cardiovascular: Negative for chest pain and leg swelling.  Gastrointestinal: Negative.   Genitourinary: Negative.   Musculoskeletal: Positive for joint pain.  Skin: Positive for rash.  Neurological: Positive for sensory change.  Psychiatric/Behavioral: The patient is nervous/anxious.      Patient Active Problem List   Diagnosis Date Noted  . Acute left-sided low back pain without sciatica 12/09/2017  . Chronic left-sided low back pain without sciatica 12/02/2017  . Interstitial cystitis (chronic) with hematuria  11/26/2017  . 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 Hypertension 03/25/2017  . Midline thoracic back pain 09/27/2015  . Acne vulgaris 09/27/2015  . Anorexia nervosa, binge-eating purging type 04/11/2015  . Dysmenorrhea 02/27/2015  . Sleep disturbance 11/18/2014  . ADHD (attention deficit hyperactivity disorder) 08/24/2014  . Adjustment disorder with mixed anxiety and depressed mood 08/23/2014    Current Outpatient Medications on File Prior to Visit  Medication Sig Dispense Refill  . ALTAVERA 0.15-30 MG-MCG tablet TAKE 1 TABLET DAILY BY MOUTH. SKIP PLACEBO PILLS. CONTINUOUS CYCLE FOR 3 MONTHS. 112 tablet 3  . cyanocobalamin (,VITAMIN B-12,) 1000 MCG/ML injection INJECT 1 ML (1,000 MCG TOTAL) INTO THE MUSCLE ONCE A WEEK. 12 mL 1  . cyclobenzaprine (FLEXERIL) 10 MG tablet TAKE 1 TABLET BY MOUTH THREE TIMES A DAY AS NEEDED FOR MUSCLE SPASMS 30 tablet 0  . ELMIRON 100 MG capsule Take 100 mg by mouth 3 (three) times daily.    . hydrOXYzine (ATARAX/VISTARIL) 25 MG tablet Take by mouth.    . loteprednol (LOTEMAX) 0.5 % ophthalmic suspension INSTILL 1 DROP INTO BOTH EYES 4 TIMES A DAY AS DIRECTED    . naproxen (NAPROSYN) 500 MG tablet TAKE 1 TABLET BY MOUTH TWICE A DAY AS NEEDED 60 tablet 1  . omega-3 acid ethyl esters (LOVAZA) 1 g capsule TAKE 1 CAPSULE (1 G TOTAL) BY MOUTH 2 (TWO) TIMES DAILY. 180 capsule 1  . pantoprazole (PROTONIX) 40 MG tablet TAKE 1 TABLET BY MOUTH EVERY DAY 90 tablet 1  .  traZODone (DESYREL) 50 MG tablet Take 50-100 mg by mouth daily.    Marland Kitchen venlafaxine XR (EFFEXOR-XR) 150 MG 24 hr capsule TAKE 1 CAPSULE BY MOUTH EVERY DAY 90 capsule 1  . [DISCONTINUED] omega-3 acid ethyl esters (LOVAZA) 1 g capsule TAKE 1 CAPSULE (1 G TOTAL) BY MOUTH 2 (TWO) TIMES DAILY. 180 capsule 1  . [DISCONTINUED] pantoprazole (PROTONIX) 40 MG tablet TAKE 1 TABLET BY MOUTH EVERY DAY 90 tablet 1  . [DISCONTINUED] venlafaxine XR (EFFEXOR-XR) 150 MG 24 hr capsule TAKE 1 CAPSULE BY MOUTH EVERY DAY 90  capsule 1   No current facility-administered medications on file prior to visit.    Allergies  Allergen Reactions  . Dust Mite Extract Other (See Comments)    Sneezing, coughing, runny nose Sneezing, coughing, runny nose     Physical Exam:    Vitals:   04/25/19 1418  BP: 114/72  Pulse: (!) 103  Weight: (!) 311 lb (141.1 kg)  Height: 5' 3.39" (1.61 m)    Growth percentile SmartLinks can only be used for patients less than 2 years old. No LMP recorded. (Menstrual status: Oral contraceptives).  Physical Exam Vitals reviewed.  Constitutional:      General: She is not in acute distress.    Appearance: Normal appearance. She is obese. She is not ill-appearing or diaphoretic.  HENT:     Head: Normocephalic and atraumatic.     Nose: Nose normal.     Mouth/Throat:     Mouth: Mucous membranes are dry.  Eyes:     Extraocular Movements: Extraocular movements intact.     Conjunctiva/sclera: Conjunctivae normal.     Pupils: Pupils are equal, round, and reactive to light.  Cardiovascular:     Rate and Rhythm: Normal rate and regular rhythm.     Pulses: Normal pulses.     Heart sounds: Normal heart sounds. No murmur.  Pulmonary:     Effort: Pulmonary effort is normal.     Breath sounds: Normal breath sounds. No wheezing.  Abdominal:     General: Abdomen is flat.  Musculoskeletal:        General: Normal range of motion.     Cervical back: Normal range of motion.     Right lower leg: No edema.     Left lower leg: No edema.  Skin:    General: Skin is warm and dry.     Capillary Refill: Capillary refill takes less than 2 seconds.  Neurological:     General: No focal deficit present.     Mental Status: She is alert and oriented to person, place, and time. Mental status is at baseline.  Psychiatric:        Speech: Speech normal.        Behavior: Behavior normal.        Thought Content: Thought content normal.        Judgment: Judgment normal.    PHQ-SADS Last 3 Score  only 03/04/2018 03/27/2017 12/29/2016  PHQ-15 Score - 9 7  Total GAD-7 Score - 3 20  Score 8 5 22    Assessment/Plan: Emily [name] is a 18yo with adjustment disorder, ADHD, AN: binge-eating purging, HTN, and chronic fatigue, here for follow up. She specifically reports who mood has been worse over the past few months, resulting in decreased activation and subsequent need for additional 5th year of college. I suspect this mood worsening is actually a manifestation of her uncontrolled ADHD, as she has been off her medication since last year. Will prioritize restarting  this medicine at a more appropriate dose, then address other problems as able.  1. ADHD: Restart concerta at 27mg . Close f/u in 2 weeks.  2. Adjustment disorder w anxiety and depressed mood: Continue effexor 150mg  QD and vistaril PRN. Consider increasing effexor if not improved w concerta. - Check ferritin, B12, Vit D  3. Daytime sleepiness: Likely due to sleep dysregulation from mood above. Given HTN, snoring, body habitus, will screen for OSA w sleep study.   4. HTN: Has been off lisinopril for weeks, BP better today. Will hold off restarting and close follow up in 2 weeks.  5. Elevated TSH: On screening from 10/2018, TSH 4.9, up from 1.5 3 yrs ago. Free T4 was 1. Repeat TSH here.   6. AN, BP: Increased behaviors since dog died two weeks ago. Will target mood disorders as above and close follow up.  Follow up in 2 weeks.

## 2019-04-25 NOTE — Patient Instructions (Addendum)
Good to meet you, [facility]!  I agree that it is important to adjust some of these medications and set a good follow-up plan to help get you back on your feet. Let's restart your concerta at a slightly higher dose and follow up in 2 weeks. From there, we can make additional adjustments as needed, potentially around your effexor.   Let's check some labs today and will follow up with the results. I would also like to check your sleep with a sleep study to screen for sleep apnea, which could cause some of these mood changes (and your higher blood pressure).   Vit D 5,000 IU  Pick up prescription for folate/b12 daily  Bring B12 injection next time

## 2019-04-26 ENCOUNTER — Ambulatory Visit: Payer: BC Managed Care – PPO | Admitting: Physical Therapy

## 2019-04-26 DIAGNOSIS — F5002 Anorexia nervosa, binge eating/purging type: Secondary | ICD-10-CM | POA: Diagnosis not present

## 2019-04-27 DIAGNOSIS — Z713 Dietary counseling and surveillance: Secondary | ICD-10-CM | POA: Diagnosis not present

## 2019-04-27 NOTE — Progress Notes (Signed)
I have reviewed the resident's note and plan of care and helped develop the plan as necessary.  PHQ-SADS Last 3 Score only 04/25/2019 03/04/2018 03/27/2017  PHQ-15 Score 8 - 9  Total GAD-7 Score 3 - 3  Score 7 8 5      Patient departed before labs could be drawn. We will get them when she returns in 2 weeks. Discussed process of sleep study- she was worried that this is all specifically related to her weight. We processed that OSA can have many etiologies and that screening is important to help her in feeling better. She was agreeable.   , FNP

## 2019-04-29 DIAGNOSIS — M9901 Segmental and somatic dysfunction of cervical region: Secondary | ICD-10-CM | POA: Diagnosis not present

## 2019-04-29 DIAGNOSIS — M9903 Segmental and somatic dysfunction of lumbar region: Secondary | ICD-10-CM | POA: Diagnosis not present

## 2019-04-29 DIAGNOSIS — M9902 Segmental and somatic dysfunction of thoracic region: Secondary | ICD-10-CM | POA: Diagnosis not present

## 2019-04-29 DIAGNOSIS — M9904 Segmental and somatic dysfunction of sacral region: Secondary | ICD-10-CM | POA: Diagnosis not present

## 2019-05-02 DIAGNOSIS — M9902 Segmental and somatic dysfunction of thoracic region: Secondary | ICD-10-CM | POA: Diagnosis not present

## 2019-05-02 DIAGNOSIS — M545 Low back pain: Secondary | ICD-10-CM | POA: Diagnosis not present

## 2019-05-02 DIAGNOSIS — M9904 Segmental and somatic dysfunction of sacral region: Secondary | ICD-10-CM | POA: Diagnosis not present

## 2019-05-02 DIAGNOSIS — M9901 Segmental and somatic dysfunction of cervical region: Secondary | ICD-10-CM | POA: Diagnosis not present

## 2019-05-02 DIAGNOSIS — M9903 Segmental and somatic dysfunction of lumbar region: Secondary | ICD-10-CM | POA: Diagnosis not present

## 2019-05-04 DIAGNOSIS — M545 Low back pain: Secondary | ICD-10-CM | POA: Diagnosis not present

## 2019-05-04 DIAGNOSIS — M9904 Segmental and somatic dysfunction of sacral region: Secondary | ICD-10-CM | POA: Diagnosis not present

## 2019-05-04 DIAGNOSIS — M9901 Segmental and somatic dysfunction of cervical region: Secondary | ICD-10-CM | POA: Diagnosis not present

## 2019-05-04 DIAGNOSIS — M9903 Segmental and somatic dysfunction of lumbar region: Secondary | ICD-10-CM | POA: Diagnosis not present

## 2019-05-04 DIAGNOSIS — M9902 Segmental and somatic dysfunction of thoracic region: Secondary | ICD-10-CM | POA: Diagnosis not present

## 2019-05-10 ENCOUNTER — Ambulatory Visit: Payer: Self-pay | Admitting: Pediatrics

## 2019-05-10 DIAGNOSIS — F5002 Anorexia nervosa, binge eating/purging type: Secondary | ICD-10-CM | POA: Diagnosis not present

## 2019-05-11 DIAGNOSIS — Z713 Dietary counseling and surveillance: Secondary | ICD-10-CM | POA: Diagnosis not present

## 2019-05-12 ENCOUNTER — Other Ambulatory Visit: Payer: Self-pay | Admitting: Pediatrics

## 2019-05-12 ENCOUNTER — Other Ambulatory Visit (HOSPITAL_COMMUNITY)
Admission: RE | Admit: 2019-05-12 | Discharge: 2019-05-12 | Disposition: A | Discharge status: Home / Self Care | Payer: BC Managed Care – PPO | Source: Ambulatory Visit | Attending: Pediatrics | Admitting: Pediatrics

## 2019-05-12 ENCOUNTER — Encounter: Payer: Self-pay | Admitting: Pediatrics

## 2019-05-12 ENCOUNTER — Ambulatory Visit (INDEPENDENT_AMBULATORY_CARE_PROVIDER_SITE_OTHER): Payer: BC Managed Care – PPO | Admitting: Pediatrics

## 2019-05-12 ENCOUNTER — Other Ambulatory Visit: Payer: Self-pay

## 2019-05-12 VITALS — BP 121/78 | HR 100 | Ht 64.33 in | Wt 315.8 lb

## 2019-05-12 DIAGNOSIS — F4323 Adjustment disorder with mixed anxiety and depressed mood: Secondary | ICD-10-CM | POA: Diagnosis not present

## 2019-05-12 DIAGNOSIS — E538 Deficiency of other specified B group vitamins: Secondary | ICD-10-CM | POA: Diagnosis not present

## 2019-05-12 DIAGNOSIS — F902 Attention-deficit hyperactivity disorder, combined type: Secondary | ICD-10-CM

## 2019-05-12 DIAGNOSIS — Z113 Encounter for screening for infections with a predominantly sexual mode of transmission: Secondary | ICD-10-CM

## 2019-05-12 DIAGNOSIS — G479 Sleep disorder, unspecified: Secondary | ICD-10-CM

## 2019-05-12 DIAGNOSIS — Z6841 Body Mass Index (BMI) 40.0 and over, adult: Secondary | ICD-10-CM | POA: Insufficient documentation

## 2019-05-12 DIAGNOSIS — F5002 Anorexia nervosa, binge eating/purging type: Secondary | ICD-10-CM

## 2019-05-12 DIAGNOSIS — R5383 Other fatigue: Secondary | ICD-10-CM | POA: Diagnosis not present

## 2019-05-12 DIAGNOSIS — R7989 Other specified abnormal findings of blood chemistry: Secondary | ICD-10-CM

## 2019-05-12 DIAGNOSIS — I10 Essential (primary) hypertension: Secondary | ICD-10-CM

## 2019-05-12 DIAGNOSIS — R5382 Chronic fatigue, unspecified: Secondary | ICD-10-CM | POA: Insufficient documentation

## 2019-05-12 DIAGNOSIS — F50029 Anorexia nervosa, binge eating/purging type, unspecified: Secondary | ICD-10-CM

## 2019-05-12 MED ORDER — CYANOCOBALAMIN 1000 MCG/ML IJ SOLN
1000.0000 ug | Freq: Once | INTRAMUSCULAR | Status: AC
  Administered 2019-05-12: 1000 ug via INTRAMUSCULAR

## 2019-05-12 MED ORDER — METHYLPHENIDATE HCL ER (OSM) 27 MG PO TBCR: 27.0000 mg | EXTENDED_RELEASE_TABLET | Freq: Every day | ORAL | 0 refills | Status: DC

## 2019-05-12 NOTE — Progress Notes (Signed)
History was provided by the patient.  Emily [name] is a 19 y.o. female who is here for adhd, anxiety, depression, hypertension, sleep concerns.   PCP confirmed? Yes.    Geoffry Paradise, MD  HPI:   Thinks she is going through another depressive spell, but is trying to power through.   Has been restricting some, binging at times.   Has had two episodes in the past two weeks where she is tachycardic and feels BP is high. Has not been taking BP medication.   Sleeping better- going to bed a little earlier and sleeping better  Denies constipation. Sometimes too hot. Has also been getting cold spells. Has had a lot of hair loss. One heavy period. Feels she is retaining fluid in abdomen. Very thirsty.    Review of Systems  Constitutional: Positive for malaise/fatigue.  Eyes: Negative for double vision.  Respiratory: Negative for shortness of breath.   Cardiovascular: Negative for chest pain and palpitations.  Gastrointestinal: Negative for abdominal pain, constipation, diarrhea, nausea and vomiting.  Genitourinary: Negative for dysuria.  Musculoskeletal: Negative for joint pain and myalgias.  Skin: Negative for rash.  Neurological: Positive for headaches. Negative for dizziness.  Endo/Heme/Allergies: Does not bruise/bleed easily.  Psychiatric/Behavioral: Positive for depression. Negative for suicidal ideas. The patient is nervous/anxious. The patient does not have insomnia.      Patient Active Problem List   Diagnosis Date Noted  . Acute left-sided low back pain without sciatica 12/09/2017  . Chronic left-sided low back pain without sciatica 12/02/2017  . Interstitial cystitis (chronic) with hematuria 11/26/2017  . 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 Hypertension 03/25/2017  . Midline thoracic back pain 09/27/2015  . Acne vulgaris 09/27/2015  . Anorexia nervosa, binge-eating purging type 04/11/2015  . Dysmenorrhea 02/27/2015  . Sleep disturbance 11/18/2014  . ADHD  (attention deficit hyperactivity disorder) 08/24/2014  . Adjustment disorder with mixed anxiety and depressed mood 08/23/2014    Current Outpatient Medications on File Prior to Visit  Medication Sig Dispense Refill  . ALTAVERA 0.15-30 MG-MCG tablet TAKE 1 TABLET DAILY BY MOUTH. SKIP PLACEBO PILLS. CONTINUOUS CYCLE FOR 3 MONTHS. 112 tablet 3  . cyclobenzaprine (FLEXERIL) 10 MG tablet TAKE 1 TABLET BY MOUTH THREE TIMES A DAY AS NEEDED FOR MUSCLE SPASMS 30 tablet 0  . ELMIRON 100 MG capsule Take 100 mg by mouth 3 (three) times daily.    . hydrOXYzine (ATARAX/VISTARIL) 25 MG tablet Take by mouth.    Marland Kitchen l-methylfolate-B6-B12 (METANX) 3-35-2 MG TABS tablet Take 1 tablet by mouth daily. 90 tablet 1  . naproxen (NAPROSYN) 500 MG tablet TAKE 1 TABLET BY MOUTH TWICE A DAY AS NEEDED 60 tablet 1  . omega-3 acid ethyl esters (LOVAZA) 1 g capsule TAKE 1 CAPSULE (1 G TOTAL) BY MOUTH 2 (TWO) TIMES DAILY. 180 capsule 1  . pantoprazole (PROTONIX) 40 MG tablet TAKE 1 TABLET BY MOUTH EVERY DAY 90 tablet 1  . venlafaxine XR (EFFEXOR-XR) 150 MG 24 hr capsule TAKE 1 CAPSULE BY MOUTH EVERY DAY 90 capsule 1  . Cholecalciferol (VITAMIN D-3) 125 MCG (5000 UT) TABS Take 1 tablet by mouth daily. (Patient not taking: Reported on 05/12/2019) 90 tablet 1  . cyanocobalamin (,VITAMIN B-12,) 1000 MCG/ML injection INJECT 1 ML (1,000 MCG TOTAL) INTO THE MUSCLE ONCE A WEEK. (Patient not taking: Reported on 05/12/2019) 12 mL 1  . loteprednol (LOTEMAX) 0.5 % ophthalmic suspension INSTILL 1 DROP INTO BOTH EYES 4 TIMES A DAY AS DIRECTED    . traZODone (DESYREL)  50 MG tablet Take 50-100 mg by mouth daily.    . [DISCONTINUED] omega-3 acid ethyl esters (LOVAZA) 1 g capsule TAKE 1 CAPSULE (1 G TOTAL) BY MOUTH 2 (TWO) TIMES DAILY. 180 capsule 1  . [DISCONTINUED] pantoprazole (PROTONIX) 40 MG tablet TAKE 1 TABLET BY MOUTH EVERY DAY 90 tablet 1  . [DISCONTINUED] venlafaxine XR (EFFEXOR-XR) 150 MG 24 hr capsule TAKE 1 CAPSULE BY MOUTH EVERY DAY 90  capsule 1   No current facility-administered medications on file prior to visit.    Allergies  Allergen Reactions  . Dust Mite Extract Other (See Comments)    Sneezing, coughing, runny nose Sneezing, coughing, runny nose     Physical Exam:    Vitals:   05/12/19 1113  BP: 121/78  Pulse: 100  Weight: (!) 315 lb 12.8 oz (143.2 kg)  Height: 5' 4.33" (1.634 m)    Growth percentile SmartLinks can only be used for patients less than 54 years old. No LMP recorded. (Menstrual status: Oral contraceptives).  Physical Exam Vitals and nursing note reviewed.  Constitutional:      General: She is not in acute distress.    Appearance: She is well-developed.  Neck:     Thyroid: No thyromegaly.  Cardiovascular:     Rate and Rhythm: Normal rate and regular rhythm.     Heart sounds: No murmur.  Pulmonary:     Breath sounds: Normal breath sounds.  Abdominal:     Palpations: Abdomen is soft. There is no mass.     Tenderness: There is no abdominal tenderness. There is no guarding.  Musculoskeletal:     Right lower leg: No edema.     Left lower leg: No edema.  Lymphadenopathy:     Cervical: No cervical adenopathy.  Skin:    General: Skin is warm.     Capillary Refill: Capillary refill takes less than 2 seconds.     Findings: No rash.  Neurological:     General: No focal deficit present.     Mental Status: She is alert.     Comments: No tremor  Psychiatric:        Mood and Affect: Mood normal.      Assessment/Plan: 1. Attention deficit hyperactivity disorder (ADHD), combined type Pick up concerta and get started.  - methylphenidate 27 MG PO CR tablet; Take 1 tablet (27 mg total) by mouth daily with breakfast.  Dispense: 30 tablet; Refill: 0  2. Adjustment disorder with mixed anxiety and depressed mood Continue effexor 150 mg daily. Continue folate, b12 and vit d.   3. Anorexia nervosa, binge-eating purging type Having more binging and restricting cycles. Continues with  dietitian.   4. Low vitamin B12 level b12 in clinic today.  - cyanocobalamin ((VITAMIN B-12)) injection 1,000 mcg  5. High thyroid stimulating hormone (TSH) level Repeat TSH. Will treat as needed.  - Thyroid Panel With TSH  6. Fatigue, unspecified type Screen for ferritin deficiency.  - Ferritin  7. Routine screening for STI (sexually transmitted infection) Per yearly protocol.   Will see in 4 weeks   Jonathon Resides, FNP

## 2019-05-12 NOTE — Patient Instructions (Addendum)
Call and schedule sleep study- [phone] Continue b12 weekly  Labs today- I will call you with results

## 2019-05-13 LAB — THYROID PANEL WITH TSH
Free Thyroxine Index: 1.8 (ref 1.4–3.8)
T3 Uptake: 24 % (ref 22–35)
T4, Total: 7.7 ug/dL (ref 5.3–11.7)
TSH: 2.99 mIU/L

## 2019-05-13 LAB — URINE CYTOLOGY ANCILLARY ONLY
Chlamydia: NEGATIVE
Comment: NEGATIVE
Comment: NORMAL
Neisseria Gonorrhea: NEGATIVE

## 2019-05-13 LAB — FERRITIN: Ferritin: 31 ng/mL (ref 16–154)

## 2019-05-24 ENCOUNTER — Other Ambulatory Visit: Payer: Self-pay

## 2019-05-24 ENCOUNTER — Ambulatory Visit: Payer: BC Managed Care – PPO

## 2019-05-24 DIAGNOSIS — F5002 Anorexia nervosa, binge eating/purging type: Secondary | ICD-10-CM | POA: Diagnosis not present

## 2019-05-26 ENCOUNTER — Ambulatory Visit: Payer: BC Managed Care – PPO

## 2019-05-26 DIAGNOSIS — Z713 Dietary counseling and surveillance: Secondary | ICD-10-CM | POA: Diagnosis not present

## 2019-05-27 ENCOUNTER — Ambulatory Visit: Payer: BC Managed Care – PPO | Admitting: Orthopaedic Surgery

## 2019-05-27 DIAGNOSIS — R202 Paresthesia of skin: Secondary | ICD-10-CM | POA: Insufficient documentation

## 2019-05-27 DIAGNOSIS — E538 Deficiency of other specified B group vitamins: Secondary | ICD-10-CM | POA: Diagnosis not present

## 2019-05-27 DIAGNOSIS — F329 Major depressive disorder, single episode, unspecified: Secondary | ICD-10-CM | POA: Diagnosis not present

## 2019-05-27 DIAGNOSIS — R1032 Left lower quadrant pain: Secondary | ICD-10-CM | POA: Diagnosis not present

## 2019-05-27 DIAGNOSIS — R7301 Impaired fasting glucose: Secondary | ICD-10-CM | POA: Diagnosis not present

## 2019-06-07 DIAGNOSIS — F5002 Anorexia nervosa, binge eating/purging type: Secondary | ICD-10-CM | POA: Diagnosis not present

## 2019-06-08 DIAGNOSIS — Z713 Dietary counseling and surveillance: Secondary | ICD-10-CM | POA: Diagnosis not present

## 2019-06-14 ENCOUNTER — Other Ambulatory Visit: Payer: Self-pay | Admitting: Pediatrics

## 2019-06-14 DIAGNOSIS — F4323 Adjustment disorder with mixed anxiety and depressed mood: Secondary | ICD-10-CM

## 2019-06-14 DIAGNOSIS — G8929 Other chronic pain: Secondary | ICD-10-CM

## 2019-06-17 ENCOUNTER — Other Ambulatory Visit (HOSPITAL_COMMUNITY)
Admission: RE | Admit: 2019-06-17 | Discharge: 2019-06-17 | Disposition: A | Discharge status: Home / Self Care | Payer: BC Managed Care – PPO | Source: Ambulatory Visit | Attending: Internal Medicine | Admitting: Internal Medicine

## 2019-06-17 DIAGNOSIS — Z20822 Contact with and (suspected) exposure to covid-19: Secondary | ICD-10-CM | POA: Diagnosis not present

## 2019-06-17 DIAGNOSIS — Z01812 Encounter for preprocedural laboratory examination: Secondary | ICD-10-CM | POA: Insufficient documentation

## 2019-06-17 LAB — SARS CORONAVIRUS 2 (TAT 6-24 HRS): SARS Coronavirus 2: NEGATIVE

## 2019-06-19 ENCOUNTER — Ambulatory Visit (HOSPITAL_BASED_OUTPATIENT_CLINIC_OR_DEPARTMENT_OTHER): Payer: BC Managed Care – PPO | Attending: Pediatrics | Admitting: Internal Medicine

## 2019-06-19 ENCOUNTER — Other Ambulatory Visit: Payer: Self-pay

## 2019-06-19 VITALS — Ht 62.0 in | Wt 315.0 lb

## 2019-06-19 DIAGNOSIS — R0683 Snoring: Secondary | ICD-10-CM | POA: Insufficient documentation

## 2019-06-19 DIAGNOSIS — G479 Sleep disorder, unspecified: Secondary | ICD-10-CM

## 2019-06-19 DIAGNOSIS — R5382 Chronic fatigue, unspecified: Secondary | ICD-10-CM

## 2019-06-19 DIAGNOSIS — Z6841 Body Mass Index (BMI) 40.0 and over, adult: Secondary | ICD-10-CM

## 2019-06-19 DIAGNOSIS — I10 Essential (primary) hypertension: Secondary | ICD-10-CM

## 2019-06-20 DIAGNOSIS — Z0000 Encounter for general adult medical examination without abnormal findings: Secondary | ICD-10-CM | POA: Diagnosis not present

## 2019-06-20 DIAGNOSIS — E538 Deficiency of other specified B group vitamins: Secondary | ICD-10-CM | POA: Diagnosis not present

## 2019-06-20 DIAGNOSIS — R7301 Impaired fasting glucose: Secondary | ICD-10-CM | POA: Diagnosis not present

## 2019-06-20 DIAGNOSIS — R1032 Left lower quadrant pain: Secondary | ICD-10-CM | POA: Diagnosis not present

## 2019-06-21 DIAGNOSIS — F5002 Anorexia nervosa, binge eating/purging type: Secondary | ICD-10-CM | POA: Diagnosis not present

## 2019-06-22 DIAGNOSIS — Z713 Dietary counseling and surveillance: Secondary | ICD-10-CM | POA: Diagnosis not present

## 2019-06-26 DIAGNOSIS — Z6841 Body Mass Index (BMI) 40.0 and over, adult: Secondary | ICD-10-CM | POA: Diagnosis not present

## 2019-06-26 NOTE — Procedures (Signed)
   Patient Name: Emily [name], Emily [name] Date: 06/19/2019 Gender: Female D.O.B: [birth year] Age (years): 18 Referring Provider: Alfonso Ramus FNP Height (inches): 62 Interpreting Physician: Jetty Duhamel MD, ABSM Weight (lbs): 315 RPSGT: Cherylann Parr BMI: 58 MRN: [id number] Neck Size: 16.00  CLINICAL INFORMATION Sleep Study Type: NPSG Indication for sleep study: Obesity, Snoring, Witnesses Apnea / Gasping During Sleep Epworth Sleepiness Score: 8  SLEEP STUDY TECHNIQUE As per the AASM Manual for the Scoring of Sleep and Associated Events v2.3 (April 2016) with a hypopnea requiring 4% desaturations.  The channels recorded and monitored were frontal, central and occipital EEG, electrooculogram (EOG), submentalis EMG (chin), nasal and oral airflow, thoracic and abdominal wall motion, anterior tibialis EMG, snore microphone, electrocardiogram, and pulse oximetry.  MEDICATIONS Medications self-administered by patient taken the night of the study : none reported  SLEEP ARCHITECTURE The study was initiated at 9:54:07 PM and ended at 4:41:26 AM.  Sleep onset time was 30.7 minutes and the sleep efficiency was 61.9%. The total sleep time was 252 minutes.  Stage REM latency was 334.5 minutes.  The patient spent 19.84% of the night in stage N1 sleep, 44.84% in stage N2 sleep, 20.24% in stage N3 and 15.1% in REM.  Alpha intrusion was absent.  Supine sleep was 76.39%.  RESPIRATORY PARAMETERS The overall apnea/hypopnea index (AHI) was 1.4 per hour. There were 0 total apneas, including 0 obstructive, 0 central and 0 mixed apneas. There were 6 hypopneas and 70 RERAs.  The AHI during Stage REM sleep was 9.5 per hour.  AHI while supine was 1.9 per hour.  The mean oxygen saturation was 96.58%. The minimum SpO2 during sleep was 89.00%.  moderate snoring was noted during this study.  CARDIAC DATA The 2 lead EKG demonstrated sinus rhythm. The mean heart rate was 93.11 beats per minute.  Other EKG findings include: None.  LEG MOVEMENT DATA The total PLMS were 4 with a resulting PLMS index of 0.95. Associated arousal with leg movement index was 0.0 .  IMPRESSIONS - No significant obstructive sleep apnea occurred during this study (AHI = 1.4/h). - No significant central sleep apnea occurred during this study (CAI = 0.0/h). - The patient had minimal or no oxygen desaturation during the study (Min O2 = 89.00%) - The patient snored with moderate snoring volume. - No cardiac abnormalities were noted during this study. - Clinically significant periodic limb movements did not occur during sleep. No significant associated arousals. - The patient had difficulty initiating and maintaining sleep.  DIAGNOSIS - Normal study  RECOMMENDATIONS - Nonspecific difficulty initiating and maintaining sleep may simply reflect unfamiliar testing environment. If this is characteristic of home experience, consider managing as insomnia.  - Sleep hygiene should be reviewed to assess factors that may improve sleep quality. - Weight management and regular exercise should be initiated or continued if appropriate.  [Electronically signed] 06/26/2019 10:37 AM  Jetty Duhamel MD, ABSM Diplomate, American Board of Sleep Medicine   NPI: [id number]                          Jetty Duhamel Diplomate, American Board of Sleep Medicine  ELECTRONICALLY SIGNED ON:  06/26/2019, 10:33 AM [facility] PH: [phone]   FX: [phone] ACCREDITED BY THE AMERICAN ACADEMY OF SLEEP MEDICINE

## 2019-06-30 ENCOUNTER — Other Ambulatory Visit: Payer: Self-pay | Admitting: Pediatrics

## 2019-06-30 DIAGNOSIS — K219 Gastro-esophageal reflux disease without esophagitis: Secondary | ICD-10-CM

## 2019-07-04 ENCOUNTER — Other Ambulatory Visit: Payer: Self-pay | Admitting: Pediatrics

## 2019-07-05 DIAGNOSIS — F5002 Anorexia nervosa, binge eating/purging type: Secondary | ICD-10-CM | POA: Diagnosis not present

## 2019-07-06 DIAGNOSIS — Z713 Dietary counseling and surveillance: Secondary | ICD-10-CM | POA: Diagnosis not present

## 2019-07-21 DIAGNOSIS — Z713 Dietary counseling and surveillance: Secondary | ICD-10-CM | POA: Diagnosis not present

## 2019-07-26 DIAGNOSIS — F5002 Anorexia nervosa, binge eating/purging type: Secondary | ICD-10-CM | POA: Diagnosis not present

## 2019-07-28 DIAGNOSIS — Z23 Encounter for immunization: Secondary | ICD-10-CM | POA: Diagnosis not present

## 2019-08-03 DIAGNOSIS — Z713 Dietary counseling and surveillance: Secondary | ICD-10-CM | POA: Diagnosis not present

## 2019-08-09 DIAGNOSIS — F5002 Anorexia nervosa, binge eating/purging type: Secondary | ICD-10-CM | POA: Diagnosis not present

## 2019-08-17 DIAGNOSIS — Z713 Dietary counseling and surveillance: Secondary | ICD-10-CM | POA: Diagnosis not present

## 2019-08-23 DIAGNOSIS — F5002 Anorexia nervosa, binge eating/purging type: Secondary | ICD-10-CM | POA: Diagnosis not present

## 2019-08-25 ENCOUNTER — Ambulatory Visit: Payer: BC Managed Care – PPO | Admitting: Allergy & Immunology

## 2019-08-25 ENCOUNTER — Other Ambulatory Visit: Payer: Self-pay

## 2019-08-25 ENCOUNTER — Encounter: Payer: Self-pay | Admitting: Allergy & Immunology

## 2019-08-25 VITALS — BP 108/88 | HR 96 | Temp 97.7°F | Resp 16 | Ht 62.0 in | Wt 312.8 lb

## 2019-08-25 DIAGNOSIS — J310 Chronic rhinitis: Secondary | ICD-10-CM

## 2019-08-25 DIAGNOSIS — J4520 Mild intermittent asthma, uncomplicated: Secondary | ICD-10-CM | POA: Diagnosis not present

## 2019-08-25 NOTE — Patient Instructions (Addendum)
1. Mild intermittent asthma, uncomplicated - Lung testing looked good today. - We are not going to make any changes at this time. - Spacer sample and demonstration provided. - Daily controller medication(s): NONE - Prior to physical activity: albuterol 2 puffs 10-15 minutes before physical activity. - Rescue medications: albuterol 4 puffs every 4-6 hours as needed - Asthma control goals:  * Full participation in all desired activities (may need albuterol before activity) * Albuterol use two time or less a week on average (not counting use with activity) * Cough interfering with sleep two time or less a month * Oral steroids no more than once a year * No hospitalizations  2. Chronic rhinitis - Testing today showed: negative to the entire panel  - Copy of test results provided.  - Start taking: Zyrtec (cetirizine) 10mg  tablet once daily and Flonase (fluticasone) one spray per nostril daily (TRY USING ON DAYS OF CAT EXPOSURE ONLY) - You can take an extra dose of antihistamines if needed on bad days.  - This may be what is known as "local allergic rhinitis", which occurs when your immune system makes localized IgE (allergy antibodies) to various allergens (as opposed to systemic) IgE - Therefore, since IgE is not make systemically, allergic sensitizations are not picked up on routine skin testing. - There are specialized academic research centers that can test for IgE in the nasal cavity, but this is not done in the clinical setting at this time.  - It can still be treated with nasal sprays and antihistamines.   3. Return in about 6 months (around 02/25/2020). This can be an in-person, a virtual Webex or a telephone follow up visit.   Please inform 04/24/2020 of any Emergency Department visits, hospitalizations, or changes in symptoms. Call us before going to the ED for breathing or allergy symptoms since we might be able to fit you in for a sick visit. Feel free to contact us anytime with any  questions, problems, or concerns.  It was a pleasure to meet you today!  Websites that have reliable patient information: 1. American Academy of Asthma, Allergy, and Immunology: www.aaaai.org 2. Food Allergy Research and Education (FARE): foodallergy.org 3. Mothers of Asthmatics: http://www.asthmacommunitynetwork.org 4. American College of Allergy, Asthma, and Immunology: www.acaai.org   COVID-19 Vaccine Information can be found at: [place] For questions related to vaccine distribution or appointments, please email [email or web address] or call [phone].     Like [phone] on [place] and Instagram for our latest updates!        Make sure you are registered to vote! If you have moved or changed any of your contact information, you will need to get this updated before voting!  In some cases, you MAY be able to register to vote online: [employer]

## 2019-08-25 NOTE — Progress Notes (Signed)
NEW PATIENT  Date of Service/Encounter:  08/25/19  Referring provider: Geoffry Paradise, MD   Assessment:   Mild intermittent asthma, uncomplicated   Chronic non-allergic rhinitis   Plan/Recommendations:   1. Mild intermittent asthma, uncomplicated - Lung testing looked good today. - We are not going to make any changes at this time. - Spacer sample and demonstration provided. - Daily controller medication(s): NONE - Prior to physical activity: albuterol 2 puffs 10-15 minutes before physical activity. - Rescue medications: albuterol 4 puffs every 4-6 hours as needed - Asthma control goals:  * Full participation in all desired activities (may need albuterol before activity) * Albuterol use two time or less a week on average (not counting use with activity) * Cough interfering with sleep two time or less a month * Oral steroids no more than once a year * No hospitalizations  2. Chronic rhinitis - Testing today showed: negative to the entire panel  - Copy of test results provided.  - Start taking: Zyrtec (cetirizine) 10mg  tablet once daily and Flonase (fluticasone) one spray per nostril daily (TRY USING ON DAYS OF CAT EXPOSURE ONLY) - You can take an extra dose of antihistamines if needed on bad days.  - This may be what is known as "local allergic rhinitis", which occurs when your immune system makes localized IgE (allergy antibodies) to various allergens (as opposed to systemic) IgE - Therefore, since IgE is not make systemically, allergic sensitizations are not picked up on routine skin testing. - There are specialized academic research centers that can test for IgE in the nasal cavity, but this is not done in the clinical setting at this time.  - It can still be treated with nasal sprays and antihistamines.   3. Return in about 6 months (around 02/25/2020). This can be an in-person, a virtual Webex or a telephone follow up visit.   Subjective:   Emily [name] is  a 19 y.o. female presenting today for evaluation of  Chief Complaint  Patient presents with  . Asthma    She had childhood asthma. She since has outgrew asthma. no complications since she was 19 years old.  . Allergic Rhinitis     While pet sitting her throat feels as if its tight, and she gets a sinus headache, and congestion, runny nose    Emily [name] has a history of the following: Patient Active Problem List   Diagnosis Date Noted  . BMI 50.0-59.9, adult (HCC) 05/12/2019  . Chronic fatigue 05/12/2019  . Acute left-sided low back pain without sciatica 12/09/2017  . Chronic left-sided low back pain without sciatica 12/02/2017  . Interstitial cystitis (chronic) with hematuria 11/26/2017  . 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 Hypertension 03/25/2017  . Midline thoracic back pain 09/27/2015  . Acne vulgaris 09/27/2015  . Anorexia nervosa, binge-eating purging type 04/11/2015  . Dysmenorrhea 02/27/2015  . Sleep disturbance 11/18/2014  . ADHD (attention deficit hyperactivity disorder) 08/24/2014  . Adjustment disorder with mixed anxiety and depressed mood 08/23/2014    History obtained from: chart review and patient.  [name] was referred by 10/23/2014, MD.     Emily [name] is a 19 y.o. female presenting for an evaluation of environmental allergies. She has started a cat sitting business. She reports that when she is pet sitting a cat, she has throat tightening and rhinorrhea. She has thought about taking Benadryl and Claritin and Zyrtec to treat any symptoms, but when she leaves . But she did not  take anything since she had the appointment. She was not around cats prior to starting this business.   She does report some issues with dust mites. She also thinks that she was positive to [facility] grass. This was done somewhere here in [place] but this was quite a while ago. She was never on allergy shots. Overall she has never had much in the way of  perennial symptoms - she mostly seems to notice them when she is around animals.   She does have a history of asthma. She is not on a daily medication for her asthma. She has albuterol on hand, but refills it less than one time per year. She has no symptoms food allergies. She has no eczema at all.   Otherwise, there is no history of other atopic diseases, including food allergies, drug allergies, stinging insect allergies, eczema, urticaria or contact dermatitis. There is no significant infectious history. Vaccinations are up to date.    Past Medical History: Patient Active Problem List   Diagnosis Date Noted  . BMI 50.0-59.9, adult (HCC) 05/12/2019  . Chronic fatigue 05/12/2019  . Acute left-sided low back pain without sciatica 12/09/2017  . Chronic left-sided low back pain without sciatica 12/02/2017  . Interstitial cystitis (chronic) with hematuria 11/26/2017  . 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 Hypertension 03/25/2017  . Midline thoracic back pain 09/27/2015  . Acne vulgaris 09/27/2015  . Anorexia nervosa, binge-eating purging type 04/11/2015  . Dysmenorrhea 02/27/2015  . Sleep disturbance 11/18/2014  . ADHD (attention deficit hyperactivity disorder) 08/24/2014  . Adjustment disorder with mixed anxiety and depressed mood 08/23/2014    Medication List:  Allergies as of 08/25/2019      Reactions   Dust Mite Extract Other (See Comments)   Sneezing, coughing, runny nose Sneezing, coughing, runny nose      Medication List       Accurate as of August 25, 2019  4:28 PM. If you have any questions, ask your nurse or doctor.        Altavera 0.15-30 MG-MCG tablet Generic drug: levonorgestrel-ethinyl estradiol TAKE 1 TABLET DAILY BY MOUTH. SKIP PLACEBO PILLS. CONTINUOUS CYCLE FOR 3 MONTHS.   cyanocobalamin 1000 MCG/ML injection Commonly known as: (VITAMIN B-12) INJECT 1 ML (1,000 MCG TOTAL) INTO THE MUSCLE ONCE A WEEK.   cyclobenzaprine 10 MG tablet Commonly known  as: FLEXERIL TAKE 1 TABLET BY MOUTH THREE TIMES A DAY AS NEEDED FOR MUSCLE SPASMS   Elmiron 100 MG capsule Generic drug: pentosan polysulfate Take 100 mg by mouth 3 (three) times daily.   hydrOXYzine 25 MG tablet Commonly known as: ATARAX/VISTARIL Take by mouth.   l-methylfolate-B6-B12 3-35-2 MG Tabs tablet Commonly known as: METANX Take 1 tablet by mouth daily.   lisinopril 20 MG tablet Commonly known as: ZESTRIL TAKE 1 TABLET BY MOUTH EVERY DAY   loteprednol 0.5 % ophthalmic suspension Commonly known as: LOTEMAX INSTILL 1 DROP INTO BOTH EYES 4 TIMES A DAY AS DIRECTED   methylphenidate 27 MG CR tablet Commonly known as: CONCERTA Take 1 tablet (27 mg total) by mouth daily with breakfast.   naproxen 500 MG tablet Commonly known as: NAPROSYN TAKE 1 TABLET BY MOUTH TWICE A DAY AS NEEDED   omega-3 acid ethyl esters 1 g capsule Commonly known as: LOVAZA TAKE 1 CAPSULE (1 G TOTAL) BY MOUTH 2 (TWO) TIMES DAILY.   omeprazole 20 MG capsule Commonly known as: PRILOSEC Take 1 capsule (20 mg total) by mouth daily.   traZODone 50 MG tablet  Commonly known as: DESYREL Take 50-100 mg by mouth daily.   venlafaxine XR 150 MG 24 hr capsule Commonly known as: EFFEXOR-XR TAKE 1 CAPSULE BY MOUTH EVERY DAY   Vitamin D-3 125 MCG (5000 UT) Tabs Take 1 tablet by mouth daily.       Birth History: non-contributory  Developmental History: non-contributory  Past Surgical History: Past Surgical History:  Procedure Laterality Date  . ADENOIDECTOMY    . TONSILLECTOMY       Family History: Family History  Problem Relation Age of Onset  . Anxiety disorder Mother   . Hyperlipidemia Maternal Grandmother   . Hypertension Maternal Grandmother   . Hyperlipidemia Maternal Grandfather   . Hypertension Maternal Grandfather   . Drug abuse Father      Social History: [name] lives in a house that was built in 1969.  There are wood floors throughout the home.  They have electric  heating and central cooling.  There are 2 dogs inside of the home.  There are no dust mite covers on the bedding.  There is no tobacco exposure.  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  She is also getting an undergraduate degree in history from [employer].  There is no HEPA filter in the home.  She does not have any chemical, fume, or dust exposure.  There is no tobacco exposure.   Review of Systems  Constitutional: Negative.  Negative for chills, fever, malaise/fatigue and weight loss.  HENT: Positive for congestion and sinus pain. Negative for ear discharge and ear pain.   Eyes: Negative for pain, discharge and redness.       Positive of ocular pruritis.   Respiratory: Negative for cough, sputum production, shortness of breath and wheezing.   Cardiovascular: Negative.  Negative for chest pain and palpitations.  Gastrointestinal: Negative for abdominal pain, constipation, diarrhea, heartburn, nausea and vomiting.  Skin: Negative.  Negative for itching and rash.  Neurological: Negative for dizziness and headaches.  Endo/Heme/Allergies: Positive for environmental allergies. Does not bruise/bleed easily.       Objective:   Blood pressure 108/88, pulse 96, temperature 97.7 F (36.5 C), resp. rate 16, height 5\' 2"  (1.575 m), weight (!) 312 lb 12.8 oz (141.9 kg), SpO2 97 %. Body mass index is 57.21 kg/m.   Physical Exam:   Physical Exam Constitutional:      Appearance: She is well-developed. She is obese.     Comments: Talkative pleasant female.   HENT:     Head: Normocephalic and atraumatic.     Right Ear: Tympanic membrane, ear canal and external ear normal. No drainage, swelling or tenderness. Tympanic membrane is not injected, scarred, erythematous, retracted or bulging.     Left Ear: Tympanic membrane, ear canal and external ear normal. No drainage, swelling or tenderness. Tympanic membrane is not injected, scarred, erythematous, retracted or bulging.      Nose: No nasal deformity, septal deviation, mucosal edema or rhinorrhea.     Right Turbinates: Enlarged and swollen.     Left Turbinates: Enlarged and swollen.     Right Sinus: No maxillary sinus tenderness or frontal sinus tenderness.     Left Sinus: No maxillary sinus tenderness or frontal sinus tenderness.     Comments: Turbinates enlarged bilaterally.    Mouth/Throat:     Mouth: Mucous membranes are not pale and not dry.     Pharynx: Uvula midline.     Comments: Cobblestoning present in the posterior oropharynx. Tonsils enlarged. No  exudates. Eyes:     General:        Right eye: No discharge.        Left eye: No discharge.     Conjunctiva/sclera: Conjunctivae normal.     Right eye: Right conjunctiva is not injected. No chemosis.    Left eye: Left conjunctiva is not injected. No chemosis.    Pupils: Pupils are equal, round, and reactive to light.  Cardiovascular:     Rate and Rhythm: Normal rate and regular rhythm.     Heart sounds: Normal heart sounds.  Pulmonary:     Effort: Pulmonary effort is normal. No tachypnea, accessory muscle usage or respiratory distress.     Breath sounds: Normal breath sounds. No wheezing, rhonchi or rales.     Comments: Moving air well in all lung fields. No increased work of breathing noted.  Chest:     Chest wall: No tenderness.  Abdominal:     Tenderness: There is no abdominal tenderness. There is no guarding or rebound.  Lymphadenopathy:     Head:     Right side of head: No submandibular, tonsillar or occipital adenopathy.     Left side of head: No submandibular, tonsillar or occipital adenopathy.     Cervical: No cervical adenopathy.  Skin:    Coloration: Skin is not pale.     Findings: No abrasion, erythema, petechiae or rash. Rash is not papular, urticarial or vesicular.     Comments: No eczematous lesions noted.   Neurological:     Mental Status: She is alert.      Diagnostic studies:   Spirometry: Normal FEV1, FVC, and FEV1/FVC  ratio. There is no scooping suggestive of obstructive disease.    Allergy Studies:     Airborne Adult Perc - 08/25/19 1454    Time Antigen Placed 1454    Allergen Manufacturer Waynette Buttery    Location Back    Number of Test 59    Panel 1 Select    1. Control-Buffer 50% Glycerol Negative    2. Control-Histamine 1 mg/ml 2+    3. Albumin saline Negative    4. Bahia Negative    5. French Southern Territories Negative    6. Johnson Negative    7. Kentucky Blue Negative    8. Meadow Fescue Negative    9. Perennial Rye Negative    10. Sweet Vernal Negative    11. Timothy Negative    12. Cocklebur Negative    13. Burweed Marshelder Negative    14. Ragweed, short Negative    15. Ragweed, Giant Negative    16. Plantain,  English Negative    17. Lamb's Quarters Negative    18. Sheep Sorrell Negative    19. Rough Pigweed Negative    20. Marsh Elder, Rough Negative    21. Mugwort, Common Negative    22. Ash mix Negative    23. Birch mix Negative    24. Beech American Negative    25. Box, Elder Negative    26. Cedar, red Negative    27. Cottonwood, Guinea-Bissau Negative    28. Elm mix Negative    29. Hickory Negative    30. Maple mix Negative    31. Oak, Guinea-Bissau mix Negative    32. Pecan Pollen Negative    33. Pine mix Negative    34. Sycamore Eastern Negative    35. Walnut, Black Pollen Negative    36. Alternaria alternata Negative    37. Cladosporium Herbarum Negative  38. Aspergillus mix Negative    39. Penicillium mix Negative    40. Bipolaris sorokiniana (Helminthosporium) Negative    41. Drechslera spicifera (Curvularia) Negative    42. Mucor plumbeus Negative    43. Fusarium moniliforme Negative    44. Aureobasidium pullulans (pullulara) Negative    45. Rhizopus oryzae Negative    46. Botrytis cinera Negative    47. Epicoccum nigrum Negative    48. Phoma betae Negative    49. Candida Albicans Negative    50. Trichophyton mentagrophytes Negative    51. Mite, D Farinae  5,000 AU/ml Negative     52. Mite, D Pteronyssinus  5,000 AU/ml Negative    53. Cat Hair 10,000 BAU/ml Negative    54.  Dog Epithelia Negative    55. Mixed Feathers Negative    56. Horse Epithelia Negative    57. Cockroach, German Negative    58. Mouse Negative    59. Tobacco Leaf Negative          Intradermal - 08/25/19 1555    Time Antigen Placed 1530    Allergen Manufacturer Waynette Buttery    Location Arm    Number of Test 15    Intradermal Select    Control Negative    French Southern Territories Negative    Johnson Negative    7 Grass Negative    Ragweed mix Negative    Weed mix Negative    Tree mix Negative    Mold 1 Negative    Mold 2 Negative    Mold 3 Negative    Mold 4 Negative    Cat Negative    Dog Negative    Cockroach Negative    Mite mix Negative           Allergy testing results were read and interpreted by myself, documented by clinical staff.         [name], MD [facility]

## 2019-08-27 ENCOUNTER — Encounter: Payer: Self-pay | Admitting: Allergy & Immunology

## 2019-08-28 ENCOUNTER — Other Ambulatory Visit: Payer: Self-pay | Admitting: Pediatrics

## 2019-08-28 DIAGNOSIS — K219 Gastro-esophageal reflux disease without esophagitis: Secondary | ICD-10-CM

## 2019-09-06 ENCOUNTER — Telehealth: Payer: Self-pay | Admitting: Allergy & Immunology

## 2019-09-06 DIAGNOSIS — F5002 Anorexia nervosa, binge eating/purging type: Secondary | ICD-10-CM | POA: Diagnosis not present

## 2019-09-06 MED ORDER — ALBUTEROL SULFATE HFA 108 (90 BASE) MCG/ACT IN AERS: 2.0000 | INHALATION_SPRAY | RESPIRATORY_TRACT | 1 refills | Status: DC | PRN

## 2019-09-06 NOTE — Telephone Encounter (Signed)
Called and left voicemail message for patient to call office.  Ventolin has been sent in to [facility]

## 2019-09-06 NOTE — Telephone Encounter (Signed)
Patient called and said that she came in about week ago and the inhaler has not been called in yet. She doesn't know which one but she does have the spacers.[facility] [address] [phone].

## 2019-09-07 NOTE — Telephone Encounter (Signed)
Called and spoke with patient.  Informed patient Ventolin HFA inhaler was sent in to [facility] yesterday.  Reviewed directions of use and use with spacer.  Patient voiced understanding.

## 2019-09-14 DIAGNOSIS — Z713 Dietary counseling and surveillance: Secondary | ICD-10-CM | POA: Diagnosis not present

## 2019-09-20 DIAGNOSIS — F5002 Anorexia nervosa, binge eating/purging type: Secondary | ICD-10-CM | POA: Diagnosis not present

## 2019-09-28 DIAGNOSIS — Z713 Dietary counseling and surveillance: Secondary | ICD-10-CM | POA: Diagnosis not present

## 2019-10-06 ENCOUNTER — Encounter: Payer: Self-pay | Admitting: Gastroenterology

## 2019-10-06 DIAGNOSIS — F5002 Anorexia nervosa, binge eating/purging type: Secondary | ICD-10-CM | POA: Diagnosis not present

## 2019-10-18 DIAGNOSIS — Z713 Dietary counseling and surveillance: Secondary | ICD-10-CM | POA: Diagnosis not present

## 2019-10-18 DIAGNOSIS — F5002 Anorexia nervosa, binge eating/purging type: Secondary | ICD-10-CM | POA: Diagnosis not present

## 2019-10-24 ENCOUNTER — Ambulatory Visit: Payer: BC Managed Care – PPO | Admitting: Pediatrics

## 2019-11-09 DIAGNOSIS — Z713 Dietary counseling and surveillance: Secondary | ICD-10-CM | POA: Diagnosis not present

## 2019-11-15 DIAGNOSIS — F5002 Anorexia nervosa, binge eating/purging type: Secondary | ICD-10-CM | POA: Diagnosis not present

## 2019-11-23 DIAGNOSIS — Z713 Dietary counseling and surveillance: Secondary | ICD-10-CM | POA: Diagnosis not present

## 2019-11-29 ENCOUNTER — Ambulatory Visit (INDEPENDENT_AMBULATORY_CARE_PROVIDER_SITE_OTHER): Payer: BC Managed Care – PPO | Admitting: Gastroenterology

## 2019-11-29 ENCOUNTER — Other Ambulatory Visit: Payer: BC Managed Care – PPO

## 2019-11-29 ENCOUNTER — Encounter: Payer: Self-pay | Admitting: Gastroenterology

## 2019-11-29 VITALS — BP 110/62 | HR 107 | Ht 62.0 in

## 2019-11-29 DIAGNOSIS — R197 Diarrhea, unspecified: Secondary | ICD-10-CM

## 2019-11-29 DIAGNOSIS — R1031 Right lower quadrant pain: Secondary | ICD-10-CM | POA: Diagnosis not present

## 2019-11-29 DIAGNOSIS — F5002 Anorexia nervosa, binge eating/purging type: Secondary | ICD-10-CM | POA: Diagnosis not present

## 2019-11-29 DIAGNOSIS — R1032 Left lower quadrant pain: Secondary | ICD-10-CM | POA: Diagnosis not present

## 2019-11-29 MED ORDER — DICYCLOMINE HCL 10 MG PO CAPS: 10.0000 mg | ORAL_CAPSULE | Freq: Three times a day (TID) | ORAL | 11 refills | Status: DC

## 2019-11-29 NOTE — Patient Instructions (Signed)
Your provider has requested that you go to the basement level for lab work before leaving today. Press "B" on the elevator. The lab is located at the first door on the left as you exit the elevator.   We have sent the following medications to your pharmacy for you to pick up at your convenience:dicyclomine.    Due to recent changes in healthcare laws, you may see the results of your imaging and laboratory studies on MyChart before your provider has had a chance to review them.  We understand that in some cases there may be results that are confusing or concerning to you. Not all laboratory results come back in the same time frame and the provider may be waiting for multiple results in order to interpret others.  Please give [place] 48 hours in order for your provider to thoroughly review all the results before contacting the office for clarification of your results.   Thank you for choosing me and [facility].  Venita Lick. Pleas Koch., MD., Clementeen Graham

## 2019-11-29 NOTE — Progress Notes (Signed)
History of Present Illness: This is a 19 referred by Geoffry Paradise, MD for the evaluation of abdominal pain and diarrhea.  She relates problems for the past 18 months with urgent, watery stools occurring between 3 and 7 times per day.  Symptoms often following meals and have been associated with left lower quadrant and right lower quadrant abdominal pain.  She took a probiotic for 1 month which gave mild symptomatic benefit.  She has discontinued milk products and high fat foods without change in symptoms.  She does not generally consume caffeinated beverages.  She has a history of anorexia nervosa with binge eating purging.  She is concerned about her weight and asks that her weight not be given to her or printed on her AVS. There is no family history of IBD or celiac disease.  CMP, CBC were normal in May. Thyroid testing was normal in April.  She has reflux symptoms that are well controlled on daily omeprazole.  Denies weight loss, constipation, change in stool caliber, melena, hematochezia, nausea, vomiting, dysphagia, chest pain.   Allergies  Allergen Reactions  . Dust Mite Extract Other (See Comments)    Sneezing, coughing, runny nose Sneezing, coughing, runny nose    Outpatient Medications Prior to Visit  Medication Sig Dispense Refill  . albuterol (VENTOLIN HFA) 108 (90 Base) MCG/ACT inhaler Inhale 2 puffs into the lungs every 4 (four) hours as needed for wheezing or shortness of breath. 18 g 1  . ALTAVERA 0.15-30 MG-MCG tablet TAKE 1 TABLET DAILY BY MOUTH. SKIP PLACEBO PILLS. CONTINUOUS CYCLE FOR 3 MONTHS. 112 tablet 3  . Cholecalciferol (VITAMIN D-3) 125 MCG (5000 UT) TABS Take 1 tablet by mouth daily. 90 tablet 1  . cyanocobalamin (,VITAMIN B-12,) 1000 MCG/ML injection INJECT 1 ML (1,000 MCG TOTAL) INTO THE MUSCLE ONCE A WEEK. 12 mL 1  . cyclobenzaprine (FLEXERIL) 10 MG tablet TAKE 1 TABLET BY MOUTH THREE TIMES A DAY AS NEEDED FOR MUSCLE SPASMS 30 tablet 0  . ELMIRON 100 MG  capsule Take 100 mg by mouth 3 (three) times daily.    . hydrOXYzine (ATARAX/VISTARIL) 25 MG tablet Take by mouth.    Marland Kitchen l-methylfolate-B6-B12 (METANX) 3-35-2 MG TABS tablet Take 1 tablet by mouth daily. 90 tablet 1  . loteprednol (LOTEMAX) 0.5 % ophthalmic suspension INSTILL 1 DROP INTO BOTH EYES 4 TIMES A DAY AS DIRECTED    . methylphenidate 27 MG PO CR tablet Take 1 tablet (27 mg total) by mouth daily with breakfast. 30 tablet 0  . naproxen (NAPROSYN) 500 MG tablet TAKE 1 TABLET BY MOUTH TWICE A DAY AS NEEDED 60 tablet 1  . omega-3 acid ethyl esters (LOVAZA) 1 g capsule TAKE 1 CAPSULE (1 G TOTAL) BY MOUTH 2 (TWO) TIMES DAILY. 180 capsule 1  . omeprazole (PRILOSEC) 20 MG capsule TAKE 1 CAPSULE BY MOUTH EVERY DAY 90 capsule 0  . venlafaxine XR (EFFEXOR-XR) 150 MG 24 hr capsule TAKE 1 CAPSULE BY MOUTH EVERY DAY 90 capsule 1  . lisinopril (ZESTRIL) 20 MG tablet TAKE 1 TABLET BY MOUTH EVERY DAY (Patient not taking: Reported on 08/25/2019) 90 tablet 1  . traZODone (DESYREL) 50 MG tablet Take 50-100 mg by mouth daily. (Patient not taking: Reported on 08/25/2019)     No facility-administered medications prior to visit.   Past Medical History:  Diagnosis Date  . ADHD (attention deficit hyperactivity disorder)   . Anorexia   . Anxiety   . Asthma   . B12 deficiency   .  Bulimia   . Depression   . Morbid obesity (HCC)    Past Surgical History:  Procedure Laterality Date  . ADENOIDECTOMY    . TONSILLECTOMY     Social History   Socioeconomic History  . Marital status: Single    Spouse name: Not on file  . Number of children: Not on file  . Years of education: Not on file  . Highest education level: Not on file  Occupational History  . Not on file  Tobacco Use  . Smoking status: Passive Smoke Exposure - Never Smoker  . Smokeless tobacco: Never Used  . Tobacco comment: mother smokes outside home  Substance and Sexual Activity  . Alcohol use: Yes    Alcohol/week: 0.0 standard drinks     Comment: occasional  . Drug use: No  . Sexual activity: Never    Partners: Male    Comment: Female partners  Other Topics Concern  . Not on file  Social History Narrative  . Not on file   Social Determinants of Health   Financial Resource Strain:   . Difficulty of Paying Living Expenses: Not on file  Food Insecurity:   . Worried About Programme researcher, broadcasting/film/video in the Last Year: Not on file  . Ran Out of Food in the Last Year: Not on file  Transportation Needs:   . Lack of Transportation (Medical): Not on file  . Lack of Transportation (Non-Medical): Not on file  Physical Activity:   . Days of Exercise per Week: Not on file  . Minutes of Exercise per Session: Not on file  Stress:   . Feeling of Stress : Not on file  Social Connections:   . Frequency of Communication with Friends and Family: Not on file  . Frequency of Social Gatherings with Friends and Family: Not on file  . Attends Religious Services: Not on file  . Active Member of Clubs or Organizations: Not on file  . Attends Banker Meetings: Not on file  . Marital Status: Not on file   Family History  Problem Relation Age of Onset  . Anxiety disorder Mother   . Hyperlipidemia Maternal Grandmother   . Hypertension Maternal Grandmother   . Hyperlipidemia Maternal Grandfather   . Hypertension Maternal Grandfather   . Drug abuse Father   . Colon cancer Neg Hx   . Pancreatic cancer Neg Hx   . Esophageal cancer Neg Hx   +++++++++++++++++++++++++++++++++++++++++++++++++++++    Review of Systems: Pertinent positive and negative review of systems were noted in the above HPI section. All other review of systems were otherwise negative.   Physical Exam: General: Well developed, well nourished, obese, no acute distress Head: Normocephalic and atraumatic Eyes:  sclerae anicteric, EOMI Ears: Normal auditory acuity Mouth: Not examined, mask on during Covid-19 pandemic Neck: Supple, no masses or thyromegaly Lungs:  Clear throughout to auscultation Heart: Regular rate and rhythm; no murmurs, rubs or bruits Abdomen: Soft, non tender and non distended. No masses, hepatosplenomegaly or hernias noted. Normal Bowel sounds Rectal: Not done Musculoskeletal: Symmetrical with no gross deformities  Skin: No lesions on visible extremities Pulses:  Normal pulses noted Extremities: No clubbing, cyanosis, edema or deformities noted Neurological: Alert oriented x 4, grossly nonfocal Cervical Nodes:  No significant cervical adenopathy Inguinal Nodes: No significant inguinal adenopathy Psychological:  Alert and cooperative. Normal mood and affect   Assessment and Recommendations:  1.  Diarrhea, fecal urgency, right lower quadrant and left lower quadrant abdominal pain.  Suspected IBS.  Rule out intestinal infection, celiac disease, pancreatic insufficiency, IBD.  GI stool profile, fecal elastase, tTG and IgA today.  Begin dicyclomine 20 mg p.o. 3 times daily AC.  Consider further evaluation with endoscopic exams and abdominal imaging pending above results and symptom response. REV with me or APP in 1 month.   2. GERD.  Follow antireflux measures and continue omeprazole 20 mg daily.  3. Morbid obesity BMI=56.9.    cc: Geoffry Paradise, MD [address] [place],  [place] [address]

## 2019-11-30 ENCOUNTER — Other Ambulatory Visit: Payer: Self-pay

## 2019-11-30 ENCOUNTER — Telehealth (INDEPENDENT_AMBULATORY_CARE_PROVIDER_SITE_OTHER): Payer: BC Managed Care – PPO | Admitting: Pediatrics

## 2019-11-30 DIAGNOSIS — R29898 Other symptoms and signs involving the musculoskeletal system: Secondary | ICD-10-CM

## 2019-11-30 DIAGNOSIS — G8929 Other chronic pain: Secondary | ICD-10-CM

## 2019-11-30 DIAGNOSIS — F902 Attention-deficit hyperactivity disorder, combined type: Secondary | ICD-10-CM

## 2019-11-30 DIAGNOSIS — F50029 Anorexia nervosa, binge eating/purging type, unspecified: Secondary | ICD-10-CM

## 2019-11-30 DIAGNOSIS — L700 Acne vulgaris: Secondary | ICD-10-CM

## 2019-11-30 DIAGNOSIS — M545 Low back pain, unspecified: Secondary | ICD-10-CM

## 2019-11-30 DIAGNOSIS — E782 Mixed hyperlipidemia: Secondary | ICD-10-CM | POA: Diagnosis not present

## 2019-11-30 DIAGNOSIS — F4323 Adjustment disorder with mixed anxiety and depressed mood: Secondary | ICD-10-CM

## 2019-11-30 DIAGNOSIS — I10 Essential (primary) hypertension: Secondary | ICD-10-CM

## 2019-11-30 DIAGNOSIS — F5002 Anorexia nervosa, binge eating/purging type: Secondary | ICD-10-CM

## 2019-11-30 LAB — TISSUE TRANSGLUTAMINASE, IGA: (tTG) Ab, IgA: <1 U/mL

## 2019-11-30 LAB — IGA: Immunoglobulin A: 107 mg/dL (ref 47–310)

## 2019-11-30 MED ORDER — TRETINOIN 0.025 % EX CREA: TOPICAL_CREAM | Freq: Every day | CUTANEOUS | 3 refills | Status: DC

## 2019-11-30 MED ORDER — METHYLPHENIDATE HCL ER (OSM) 27 MG PO TBCR: 27.0000 mg | EXTENDED_RELEASE_TABLET | Freq: Every day | ORAL | 0 refills | Status: DC

## 2019-11-30 MED ORDER — CYCLOBENZAPRINE HCL 10 MG PO TABS: ORAL_TABLET | ORAL | 0 refills | Status: DC

## 2019-11-30 MED ORDER — OMEGA-3-ACID ETHYL ESTERS 1 G PO CAPS: 1.0000 g | ORAL_CAPSULE | Freq: Two times a day (BID) | ORAL | 1 refills | Status: DC

## 2019-11-30 MED ORDER — METHYLPREDNISOLONE 4 MG PO TABS
ORAL_TABLET | ORAL | 0 refills | Status: DC
Start: 2019-11-30 — End: 2019-12-06

## 2019-11-30 MED ORDER — VENLAFAXINE HCL ER 150 MG PO CP24: 150.0000 mg | ORAL_CAPSULE | Freq: Every day | ORAL | 1 refills | Status: DC

## 2019-11-30 NOTE — Progress Notes (Signed)
THIS RECORD MAY CONTAIN CONFIDENTIAL INFORMATION THAT SHOULD NOT BE RELEASED WITHOUT REVIEW OF THE SERVICE PROVIDER.  Virtual Follow-Up Visit via Video Note  I connected with Emily [name] 's patient  on 11/30/19 at  3:30 PM EST by a video enabled telemedicine application and verified that I am speaking with the correct person using two identifiers.   Patient/parent location: Home   I discussed the limitations of evaluation and management by telemedicine and the availability of in person appointments.  I discussed that the purpose of this telehealth visit is to provide medical care while limiting exposure to the novel coronavirus.  The patient expressed understanding and agreed to proceed.   Emily [name] is a 19 y.o. female referred by Emily Paradise, MD here today for follow-up of adhd, sleep, anxiety, depression.  Previsit planning completed:  yes   History was provided by the patient.  Plan from Last Visit:   Continue meds   Chief Complaint: Med f/u  History of Present Illness:  Things have been rough lately. Mom got married last Dec but found out her husband has been cheating on her. Mom has been relying very heavily on her and her sister. Depression became extremely severe and she was unable to get out of bed. She then went on a trip alone to [facility] and was able to gain some self-confidence   Yesterday went to GI doctor because she has been having some "issues." She explained her eating disorder to him and he wondered if celiac or UC. Ongoing work up- I see negative celiac labs.   Sleep is much better now that she is on a more normal schedule.   Wants referral to ophthalmologist for dry eyes   Acne is really flared up. She ran out of of tretinoin.   Has not been eating as well r/t stress- does notice some changes like racing HR when she hasn't had enough.   Review of Systems  Constitutional: Positive for malaise/fatigue.  Eyes: Negative for double vision.   Respiratory: Negative for shortness of breath.   Cardiovascular: Negative for chest pain and palpitations.  Gastrointestinal: Positive for diarrhea. Negative for abdominal pain, constipation, nausea and vomiting.  Genitourinary: Negative for dysuria.  Musculoskeletal: Positive for back pain and joint pain. Negative for myalgias.  Skin: Negative for rash.  Neurological: Negative for dizziness and headaches.  Endo/Heme/Allergies: Does not bruise/bleed easily.  Psychiatric/Behavioral: Positive for depression. Negative for suicidal ideas. The patient is nervous/anxious.     Allergies  Allergen Reactions  . Dust Mite Extract Other (See Comments)    Sneezing, coughing, runny nose Sneezing, coughing, runny nose    Outpatient Medications Prior to Visit  Medication Sig Dispense Refill  . albuterol (VENTOLIN HFA) 108 (90 Base) MCG/ACT inhaler Inhale 2 puffs into the lungs every 4 (four) hours as needed for wheezing or shortness of breath. 18 g 1  . ALTAVERA 0.15-30 MG-MCG tablet TAKE 1 TABLET DAILY BY MOUTH. SKIP PLACEBO PILLS. CONTINUOUS CYCLE FOR 3 MONTHS. 112 tablet 3  . Cholecalciferol (VITAMIN D-3) 125 MCG (5000 UT) TABS Take 1 tablet by mouth daily. 90 tablet 1  . cyanocobalamin (,VITAMIN B-12,) 1000 MCG/ML injection INJECT 1 ML (1,000 MCG TOTAL) INTO THE MUSCLE ONCE A WEEK. 12 mL 1  . cyclobenzaprine (FLEXERIL) 10 MG tablet TAKE 1 TABLET BY MOUTH THREE TIMES A DAY AS NEEDED FOR MUSCLE SPASMS 30 tablet 0  . dicyclomine (BENTYL) 10 MG capsule Take 1 capsule (10 mg total) by mouth 3 (three)  times daily before meals. 90 capsule 11  . ELMIRON 100 MG capsule Take 100 mg by mouth 3 (three) times daily.    . hydrOXYzine (ATARAX/VISTARIL) 25 MG tablet Take by mouth.    Marland Kitchen l-methylfolate-B6-B12 (METANX) 3-35-2 MG TABS tablet Take 1 tablet by mouth daily. 90 tablet 1  . loteprednol (LOTEMAX) 0.5 % ophthalmic suspension INSTILL 1 DROP INTO BOTH EYES 4 TIMES A DAY AS DIRECTED    . methylphenidate 27  MG PO CR tablet Take 1 tablet (27 mg total) by mouth daily with breakfast. 30 tablet 0  . naproxen (NAPROSYN) 500 MG tablet TAKE 1 TABLET BY MOUTH TWICE A DAY AS NEEDED 60 tablet 1  . omega-3 acid ethyl esters (LOVAZA) 1 g capsule TAKE 1 CAPSULE (1 G TOTAL) BY MOUTH 2 (TWO) TIMES DAILY. 180 capsule 1  . omeprazole (PRILOSEC) 20 MG capsule TAKE 1 CAPSULE BY MOUTH EVERY DAY 90 capsule 0  . venlafaxine XR (EFFEXOR-XR) 150 MG 24 hr capsule TAKE 1 CAPSULE BY MOUTH EVERY DAY 90 capsule 1   No facility-administered medications prior to visit.     Patient Active Problem List   Diagnosis Date Noted  . BMI 50.0-59.9, adult (HCC) 05/12/2019  . Chronic fatigue 05/12/2019  . Acute left-sided low back pain without sciatica 12/09/2017  . Chronic left-sided low back pain without sciatica 12/02/2017  . Interstitial cystitis (chronic) with hematuria 11/26/2017  . 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 Hypertension 03/25/2017  . Midline thoracic back pain 09/27/2015  . Acne vulgaris 09/27/2015  . Anorexia nervosa, binge-eating purging type 04/11/2015  . Dysmenorrhea 02/27/2015  . Sleep disturbance 11/18/2014  . ADHD (attention deficit hyperactivity disorder) 08/24/2014  . Adjustment disorder with mixed anxiety and depressed mood 08/23/2014    The following portions of the patient's history were reviewed and updated as appropriate: allergies, current medications, past family history, past medical history, past social history, past surgical history and problem list.  Visual Observations/Objective:   General Appearance: Well nourished well developed, in no apparent distress.  Eyes: conjunctiva no swelling or erythema ENT/Mouth: No hoarseness, No cough for duration of visit.  Neck: Supple  Respiratory: Respiratory effort normal, normal rate, no retractions or distress.   Cardio: Appears well-perfused, noncyanotic Musculoskeletal: no obvious deformity Skin: visible skin without rashes, ecchymosis,  erythema Neuro: Awake and oriented X 3,  Psych:  normal affect, Insight and Judgment appropriate.    Assessment/Plan: 1. Acne vulgaris Refill for retin A.  - tretinoin (RETIN-A) 0.025 % cream; Apply topically at bedtime.  Dispense: 45 g; Refill: 3  2. Ankle weakness Referred to podiatry for assessment of ankle as well as feet to see if this is contributing to pain in multiple LE joints and back.  - Ambulatory referral to Podiatry  3. Mixed hyperlipidemia conitnue omega.  - omega-3 acid ethyl esters (LOVAZA) 1 g capsule; Take 1 capsule (1 g total) by mouth 2 (two) times daily.  Dispense: 180 capsule; Refill: 1  4. Chronic left-sided low back pain without sciatica Continue lexeril as needed.  - cyclobenzaprine (FLEXERIL) 10 MG tablet; TAKE 1 TABLET BY MOUTH THREE TIMES A DAY AS NEEDED FOR MUSCLE SPASMS  Dispense: 30 tablet; Refill: 0  5. Attention deficit hyperactivity disorder (ADHD), combined type Needs refill- not taking super consistently but would likely benefit from more consistent use.  - methylphenidate 27 MG PO CR tablet; Take 1 tablet (27 mg total) by mouth daily with breakfast.  Dispense: 30 tablet; Refill: 0  6. Adjustment disorder  with mixed anxiety and depressed mood Continue effexor. Will repeat PHQSADs when she comes next visit in person.  - venlafaxine XR (EFFEXOR-XR) 150 MG 24 hr capsule; Take 1 capsule (150 mg total) by mouth daily.  Dispense: 90 capsule; Refill: 1   BH screenings:  PHQ-SADS Last 3 Score only 04/25/2019 03/04/2018 03/27/2017  PHQ-15 Score 8 - 9  Total GAD-7 Score 3 - 3  PHQ-9 Total Score 7 8 5     Screens discussed with patient and parent and adjustments to plan made accordingly.   I discussed the assessment and treatment plan with the patient and/or parent/guardian.  They were provided an opportunity to ask questions and all were answered.  They agreed with the plan and demonstrated an understanding of the instructions. They were advised to  call back or seek an in-person evaluation in the emergency room if the symptoms worsen or if the condition fails to improve as anticipated.   Follow-up:  1 month in clinic   Medical decision-making:   I spent 40 minutes on this telehealth visit inclusive of face-to-face video and care coordination time I was located in clinic during this encounter.   , FNP    CC: Alfonso Ramus, MD, Emily Paradise, MD

## 2019-12-01 ENCOUNTER — Telehealth: Payer: Self-pay

## 2019-12-01 DIAGNOSIS — E782 Mixed hyperlipidemia: Secondary | ICD-10-CM | POA: Insufficient documentation

## 2019-12-01 DIAGNOSIS — R29898 Other symptoms and signs involving the musculoskeletal system: Secondary | ICD-10-CM | POA: Insufficient documentation

## 2019-12-01 NOTE — Telephone Encounter (Signed)
-----   Message from Meryl Dare, MD sent at 11/30/2019  6:00 PM EST ----- IgA normal, tTG negative

## 2019-12-01 NOTE — Telephone Encounter (Signed)
Left message for patient to please call back. 

## 2019-12-06 ENCOUNTER — Telehealth: Payer: Self-pay

## 2019-12-06 ENCOUNTER — Other Ambulatory Visit: Payer: Self-pay | Admitting: Pediatrics

## 2019-12-06 MED ORDER — METHYLPREDNISOLONE 4 MG PO TABS
ORAL_TABLET | ORAL | 0 refills | Status: DC
Start: 2019-12-06 — End: 2020-01-05

## 2019-12-06 NOTE — Telephone Encounter (Signed)
Done

## 2019-12-06 NOTE — Telephone Encounter (Signed)
Spoke with patient and let her know script was sent.

## 2019-12-06 NOTE — Telephone Encounter (Signed)
Pt called asking for script methylPREDNISolone (MEDROL) 4 MG tablet to be sent to the pharmacy on file.

## 2019-12-07 DIAGNOSIS — Z713 Dietary counseling and surveillance: Secondary | ICD-10-CM | POA: Diagnosis not present

## 2019-12-11 ENCOUNTER — Other Ambulatory Visit: Payer: Self-pay | Admitting: Pediatrics

## 2019-12-11 DIAGNOSIS — K219 Gastro-esophageal reflux disease without esophagitis: Secondary | ICD-10-CM

## 2019-12-12 ENCOUNTER — Ambulatory Visit (INDEPENDENT_AMBULATORY_CARE_PROVIDER_SITE_OTHER): Payer: BC Managed Care – PPO

## 2019-12-12 ENCOUNTER — Ambulatory Visit (INDEPENDENT_AMBULATORY_CARE_PROVIDER_SITE_OTHER): Payer: BC Managed Care – PPO | Admitting: Podiatry

## 2019-12-12 ENCOUNTER — Other Ambulatory Visit: Payer: Self-pay

## 2019-12-12 DIAGNOSIS — S9001XA Contusion of right ankle, initial encounter: Secondary | ICD-10-CM

## 2019-12-12 DIAGNOSIS — S93412A Sprain of calcaneofibular ligament of left ankle, initial encounter: Secondary | ICD-10-CM

## 2019-12-12 DIAGNOSIS — M25372 Other instability, left ankle: Secondary | ICD-10-CM | POA: Diagnosis not present

## 2019-12-12 NOTE — Progress Notes (Signed)
   HPI: 19 y.o. female presenting today as a new patient for evaluation of chronic instability to the left ankle.  Patient has a history of severe ankle sprain 2018.  She has gone to other physicians for evaluation.  She states that she is constantly at risk of rolling her ankle.  She feels extremely unstable when walking on uneven surfaces.  She also has intermittent pain.  This is been ongoing ever since the ankle injury in 2018.  Past Medical History:  Diagnosis Date  . ADHD (attention deficit hyperactivity disorder)   . Anorexia   . Anxiety   . Asthma   . B12 deficiency   . Bulimia   . Depression   . Morbid obesity (HCC)      Physical Exam: General: The patient is alert and oriented x3 in no acute distress.  Dermatology: Skin is warm, dry and supple bilateral lower extremities. Negative for open lesions or macerations.  Vascular: Palpable pedal pulses bilaterally. No edema or erythema noted. Capillary refill within normal limits.  Neurological: Epicritic and protective threshold grossly intact bilaterally.   Musculoskeletal Exam: Range of motion within normal limits to all pedal and ankle joints bilateral. Muscle strength 5/5 in all groups bilateral.  There is some generalized pain on palpation to the lateral aspect of the left ankle joint  Radiographic Exam:  Normal osseous mineralization. Joint spaces preserved. No fracture/dislocation/boney destruction.    Assessment: 1.  History of left ankle sprain 2.  Chronic left ankle instability   Plan of Care:  1. Patient evaluated. X-Rays reviewed.  2.  Today we are going to order MRI left ankle to determine possible ligament tear 3.  Return to clinic after MRI to review the results      Felecia Shelling, DPM [facility]  Dr. Felecia Shelling, DPM    [address] [place], [place] [address]                Office [phone]  Fax [phone]

## 2019-12-13 DIAGNOSIS — F5002 Anorexia nervosa, binge eating/purging type: Secondary | ICD-10-CM | POA: Diagnosis not present

## 2019-12-15 ENCOUNTER — Other Ambulatory Visit: Payer: Self-pay | Admitting: Podiatry

## 2019-12-16 DIAGNOSIS — L700 Acne vulgaris: Secondary | ICD-10-CM | POA: Diagnosis not present

## 2019-12-16 DIAGNOSIS — L219 Seborrheic dermatitis, unspecified: Secondary | ICD-10-CM | POA: Diagnosis not present

## 2019-12-20 ENCOUNTER — Other Ambulatory Visit: Payer: Self-pay

## 2019-12-20 ENCOUNTER — Ambulatory Visit
Admission: RE | Admit: 2019-12-20 | Discharge: 2019-12-20 | Disposition: A | Discharge status: Home / Self Care | Payer: BC Managed Care – PPO | Source: Ambulatory Visit | Attending: Podiatry | Admitting: Podiatry

## 2019-12-20 DIAGNOSIS — M25372 Other instability, left ankle: Secondary | ICD-10-CM

## 2019-12-20 DIAGNOSIS — R5381 Other malaise: Secondary | ICD-10-CM | POA: Diagnosis not present

## 2019-12-20 DIAGNOSIS — M25572 Pain in left ankle and joints of left foot: Secondary | ICD-10-CM | POA: Diagnosis not present

## 2019-12-20 DIAGNOSIS — S93412A Sprain of calcaneofibular ligament of left ankle, initial encounter: Secondary | ICD-10-CM

## 2019-12-20 DIAGNOSIS — R531 Weakness: Secondary | ICD-10-CM | POA: Diagnosis not present

## 2019-12-21 DIAGNOSIS — M25532 Pain in left wrist: Secondary | ICD-10-CM | POA: Diagnosis not present

## 2019-12-21 DIAGNOSIS — Z713 Dietary counseling and surveillance: Secondary | ICD-10-CM | POA: Diagnosis not present

## 2019-12-27 DIAGNOSIS — F5002 Anorexia nervosa, binge eating/purging type: Secondary | ICD-10-CM | POA: Diagnosis not present

## 2019-12-28 ENCOUNTER — Ambulatory Visit (INDEPENDENT_AMBULATORY_CARE_PROVIDER_SITE_OTHER): Payer: BC Managed Care – PPO | Admitting: Podiatry

## 2019-12-28 ENCOUNTER — Other Ambulatory Visit: Payer: Self-pay

## 2019-12-28 DIAGNOSIS — M25372 Other instability, left ankle: Secondary | ICD-10-CM | POA: Diagnosis not present

## 2020-01-04 DIAGNOSIS — Z713 Dietary counseling and surveillance: Secondary | ICD-10-CM | POA: Diagnosis not present

## 2020-01-05 ENCOUNTER — Other Ambulatory Visit: Payer: Self-pay

## 2020-01-05 ENCOUNTER — Encounter: Payer: Self-pay | Admitting: Pediatrics

## 2020-01-05 ENCOUNTER — Ambulatory Visit (INDEPENDENT_AMBULATORY_CARE_PROVIDER_SITE_OTHER): Payer: BC Managed Care – PPO | Admitting: Pediatrics

## 2020-01-05 VITALS — BP 146/88 | HR 104 | Ht 63.31 in | Wt 315.2 lb

## 2020-01-05 DIAGNOSIS — G8929 Other chronic pain: Secondary | ICD-10-CM

## 2020-01-05 DIAGNOSIS — F902 Attention-deficit hyperactivity disorder, combined type: Secondary | ICD-10-CM

## 2020-01-05 DIAGNOSIS — M546 Pain in thoracic spine: Secondary | ICD-10-CM | POA: Diagnosis not present

## 2020-01-05 DIAGNOSIS — R040 Epistaxis: Secondary | ICD-10-CM

## 2020-01-05 DIAGNOSIS — I10 Essential (primary) hypertension: Secondary | ICD-10-CM

## 2020-01-05 DIAGNOSIS — F4323 Adjustment disorder with mixed anxiety and depressed mood: Secondary | ICD-10-CM

## 2020-01-05 MED ORDER — METHYLPHENIDATE HCL ER (OSM) 36 MG PO TBCR: 36.0000 mg | EXTENDED_RELEASE_TABLET | Freq: Every day | ORAL | 0 refills | Status: DC

## 2020-01-05 NOTE — Progress Notes (Signed)
[phone] 

## 2020-01-05 NOTE — Progress Notes (Signed)
History was provided by the patient.  Emily [name] is a 19 y.o. female who is here for ADHD, anxiety, depression, ankle pain.  Geoffry Paradise, MD   HPI:  Pt reports that she went to see the podiatrist about her foot and ankle. She has a ligament that is not totally torn but is so loose it is not working, so she is scheduled for surgery. She is worried about it because she won't be able to drive for 4-6 weeks and has little support from family.   She is not in school right now. Issues with financial aid and finances.   Very stressed today so blood pressure is elevated. At GI office it was 110s.   Has been having frequent nosebleeds. She has gotten vessels cauterized when she was younger and wonders if it is time for this again. No other history of easy bruising or bleeding.   Having some chest pain when she takes a deep breath. Feels like her asthma is worse with the cold weather.   Continues to have difficulty with depressive symptoms related to continued need to live in a toxic environment with her mom. She has been taking concerta a little more consistently, but continues to have some difficulty with executive function, likely somewhat r/t ADHD and depression combined.   No LMP recorded. (Menstrual status: Oral contraceptives).  Review of Systems  Constitutional: Positive for malaise/fatigue.  HENT: Positive for nosebleeds.   Eyes: Negative for double vision.  Respiratory: Negative for shortness of breath.   Cardiovascular: Positive for chest pain. Negative for palpitations.  Gastrointestinal: Negative for abdominal pain, constipation, diarrhea, nausea and vomiting.  Genitourinary: Negative for dysuria.  Musculoskeletal: Positive for joint pain. Negative for myalgias.  Skin: Negative for rash.  Neurological: Negative for dizziness and headaches.  Endo/Heme/Allergies: Does not bruise/bleed easily.  Psychiatric/Behavioral: Positive for depression. Negative for suicidal  ideas. The patient is nervous/anxious.     Patient Active Problem List   Diagnosis Date Noted  . Ankle weakness 12/01/2019  . Mixed hyperlipidemia 12/01/2019  . BMI 50.0-59.9, adult (HCC) 05/12/2019  . Chronic fatigue 05/12/2019  . Acute left-sided low back pain without sciatica 12/09/2017  . Chronic left-sided low back pain without sciatica 12/02/2017  . Interstitial cystitis (chronic) with hematuria 11/26/2017  . 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 Hypertension 03/25/2017  . Midline thoracic back pain 09/27/2015  . Acne vulgaris 09/27/2015  . Anorexia nervosa, binge-eating purging type 04/11/2015  . Dysmenorrhea 02/27/2015  . Sleep disturbance 11/18/2014  . ADHD (attention deficit hyperactivity disorder) 08/24/2014  . Adjustment disorder with mixed anxiety and depressed mood 08/23/2014    Current Outpatient Medications on File Prior to Visit  Medication Sig Dispense Refill  . albuterol (VENTOLIN HFA) 108 (90 Base) MCG/ACT inhaler Inhale 2 puffs into the lungs every 4 (four) hours as needed for wheezing or shortness of breath. 18 g 1  . ALTAVERA 0.15-30 MG-MCG tablet TAKE 1 TABLET DAILY BY MOUTH. SKIP PLACEBO PILLS. CONTINUOUS CYCLE FOR 3 MONTHS. 112 tablet 3  . Cholecalciferol (VITAMIN D-3) 125 MCG (5000 UT) TABS Take 1 tablet by mouth daily. 90 tablet 1  . clindamycin (CLEOCIN T) 1 % lotion     . cyclobenzaprine (FLEXERIL) 10 MG tablet TAKE 1 TABLET BY MOUTH THREE TIMES A DAY AS NEEDED FOR MUSCLE SPASMS 30 tablet 0  . dicyclomine (BENTYL) 10 MG capsule Take 1 capsule (10 mg total) by mouth 3 (three) times daily before meals. 90 capsule 11  . ELMIRON 100  MG capsule Take 100 mg by mouth 3 (three) times daily.    . hydrOXYzine (ATARAX/VISTARIL) 25 MG tablet Take by mouth.    Marland Kitchen ketoconazole (NIZORAL) 2 % shampoo Apply topically.    Marland Kitchen l-methylfolate-B6-B12 (METANX) 3-35-2 MG TABS tablet Take 1 tablet by mouth daily. 90 tablet 1  . methylphenidate 27 MG PO CR tablet Take 1  tablet (27 mg total) by mouth daily with breakfast. 30 tablet 0  . methylPREDNISolone (MEDROL DOSEPAK) 4 MG TBPK tablet Take by mouth.    . methylPREDNISolone (MEDROL) 4 MG tablet 6-day taper to be taken as directed 21 tablet 0  . naproxen (NAPROSYN) 500 MG tablet TAKE 1 TABLET BY MOUTH TWICE A DAY AS NEEDED 60 tablet 1  . omega-3 acid ethyl esters (LOVAZA) 1 g capsule Take 1 capsule (1 g total) by mouth 2 (two) times daily. 180 capsule 1  . omeprazole (PRILOSEC) 20 MG capsule TAKE 1 CAPSULE BY MOUTH EVERY DAY 90 capsule 0  . tretinoin (RETIN-A) 0.025 % cream Apply topically at bedtime. 45 g 3  . venlafaxine XR (EFFEXOR-XR) 150 MG 24 hr capsule Take 1 capsule (150 mg total) by mouth daily. 90 capsule 1  . [DISCONTINUED] omega-3 acid ethyl esters (LOVAZA) 1 g capsule TAKE 1 CAPSULE (1 G TOTAL) BY MOUTH 2 (TWO) TIMES DAILY. 180 capsule 1  . [DISCONTINUED] pantoprazole (PROTONIX) 40 MG tablet TAKE 1 TABLET BY MOUTH EVERY DAY 90 tablet 1  . [DISCONTINUED] pantoprazole (PROTONIX) 40 MG tablet TAKE 1 TABLET BY MOUTH EVERY DAY 90 tablet 1  . [DISCONTINUED] venlafaxine XR (EFFEXOR-XR) 150 MG 24 hr capsule TAKE 1 CAPSULE BY MOUTH EVERY DAY 90 capsule 1   No current facility-administered medications on file prior to visit.    Allergies  Allergen Reactions  . Dust Mite Extract Other (See Comments)    Sneezing, coughing, runny nose Sneezing, coughing, runny nose     Physical Exam:    Vitals:   01/05/20 1048  BP: (!) 146/88  Pulse: (!) 104  Weight: (!) 315 lb 3.2 oz (143 kg)  Height: 5' 3.31" (1.608 m)    Growth percentile SmartLinks can only be used for patients less than 74 years old.  Physical Exam Vitals and nursing note reviewed.  Constitutional:      General: She is not in acute distress.    Appearance: She is well-developed.  Neck:     Thyroid: No thyromegaly.  Cardiovascular:     Rate and Rhythm: Normal rate and regular rhythm.     Heart sounds: No murmur  heard.   Pulmonary:     Breath sounds: Normal breath sounds.  Abdominal:     Palpations: Abdomen is soft. There is no mass.     Tenderness: There is no abdominal tenderness. There is no guarding.  Musculoskeletal:     Right lower leg: No edema.     Left lower leg: No edema.  Lymphadenopathy:     Cervical: No cervical adenopathy.  Skin:    General: Skin is warm.     Findings: No rash.  Neurological:     Mental Status: She is alert.     Comments: No tremor     Assessment/Plan: 1. Attention deficit hyperactivity disorder (ADHD), combined type Will increase concerta to 36 mg to see if any benefit in ADHD and executive function concerns.  - methylphenidate (CONCERTA) 36 MG PO CR tablet; Take 1 tablet (36 mg total) by mouth daily.  Dispense: 30 tablet; Refill: 0  2. Chronic midline thoracic back pain Likely r/t the way she slept recently and decreased core strength. She may benefit from PT again in the future.   3. Primary hypertension BP elevated today r/t anxiety, but I am able to see where it was normal at GI visit. We will continue to monitor closely.   4. Epistaxis, recurrent Referred to ENT for further eval. I do not think she needs any further hematology workup today.  - Ambulatory referral to ENT  5. Adjustment disorder with mixed anxiety and depressed mood Continue effexor for now. Continue therapy. Change in environment would benefit her, but not possible at this time.   Return in 4 weeks.   Alfonso Ramus, FNP

## 2020-01-05 NOTE — Patient Instructions (Addendum)
Increase concerta to 36 mg daily  We will see you in 4 weeks   Albuterol if needed   [email or web address]

## 2020-01-09 ENCOUNTER — Ambulatory Visit: Payer: BC Managed Care – PPO | Admitting: Nurse Practitioner

## 2020-01-15 NOTE — Progress Notes (Signed)
   HPI: 20 y.o. female presenting today for follow-up evaluation of chronic instability to the left ankle.  Patient has a history of severe ankle sprain 2018.  She has gone to other physicians for evaluation.  She states that she is constantly at risk of rolling her ankle.  She feels extremely unstable when walking on uneven surfaces.  She also has intermittent pain.  This is been ongoing ever since the ankle injury in 2018. Last visit MRI was ordered and she presents today to discuss the MRI results and discuss further treatment evaluation  Past Medical History:  Diagnosis Date  . ADHD (attention deficit hyperactivity disorder)   . Anorexia   . Anxiety   . Asthma   . B12 deficiency   . Bulimia   . Depression   . Morbid obesity (HCC)      Physical Exam: General: The patient is alert and oriented x3 in no acute distress.  Dermatology: Skin is warm, dry and supple bilateral lower extremities. Negative for open lesions or macerations.  Vascular: Palpable pedal pulses bilaterally. No edema or erythema noted. Capillary refill within normal limits.  Neurological: Epicritic and protective threshold grossly intact bilaterally.   Musculoskeletal Exam: Range of motion within normal limits to all pedal and ankle joints bilateral. Muscle strength 5/5 in all groups bilateral.  There is some generalized pain on palpation to the lateral aspect of the left ankle joint.  Positive anterior drawer sign.  MRI impression 12/20/2019 LT ankle:  Ligaments Lateral: Thinned, but intact anterior talofibular ligament. Calcaneofibular ligament intact. Posterior talofibular ligament intact. Anterior and posterior tibiofibular ligaments intact.   1. Thinned, but intact anterior talofibular ligament, consistent with prior injury. 2. Otherwise unremarkable MRI of the left ankle.  Assessment: 1.  History of left ankle sprain 2.  Chronic left ankle instability   Plan of Care:  1. Patient evaluated.  MRI  reviewed.  2. Today we discussed the conservative versus surgical management of the presenting pathology. The patient opts for surgical management. All possible complications and details of the procedure were explained. All patient questions were answered. No guarantees were expressed or implied. 3. Authorization for surgery was initiated today. Surgery will consist of lateral ankle ligament stabilization using an Arthrex Internal Brace system. 4.  Return to clinic 1 week postop        Felecia Shelling, DPM [facility]  Dr. Felecia Shelling, DPM    [address] [place], [place] [address]                Office [phone]  Fax [phone]

## 2020-01-18 DIAGNOSIS — Z713 Dietary counseling and surveillance: Secondary | ICD-10-CM | POA: Diagnosis not present

## 2020-01-24 DIAGNOSIS — F5002 Anorexia nervosa, binge eating/purging type: Secondary | ICD-10-CM | POA: Diagnosis not present

## 2020-01-27 DIAGNOSIS — R300 Dysuria: Secondary | ICD-10-CM | POA: Diagnosis not present

## 2020-01-27 DIAGNOSIS — N390 Urinary tract infection, site not specified: Secondary | ICD-10-CM | POA: Diagnosis not present

## 2020-02-06 ENCOUNTER — Ambulatory Visit: Payer: BC Managed Care – PPO | Admitting: Pediatrics

## 2020-02-07 DIAGNOSIS — F5002 Anorexia nervosa, binge eating/purging type: Secondary | ICD-10-CM | POA: Diagnosis not present

## 2020-02-13 ENCOUNTER — Other Ambulatory Visit: Payer: Self-pay

## 2020-02-13 ENCOUNTER — Encounter: Payer: Self-pay | Admitting: Podiatry

## 2020-02-13 ENCOUNTER — Ambulatory Visit: Payer: BC Managed Care – PPO | Admitting: Podiatry

## 2020-02-13 DIAGNOSIS — S93412A Sprain of calcaneofibular ligament of left ankle, initial encounter: Secondary | ICD-10-CM | POA: Diagnosis not present

## 2020-02-13 DIAGNOSIS — M25372 Other instability, left ankle: Secondary | ICD-10-CM

## 2020-02-13 NOTE — Progress Notes (Signed)
   HPI: 20 y.o. female presenting today for follow-up evaluation of chronic instability to the left ankle.  Patient has a history of severe ankle sprain 2018.  She has gone to other physicians for evaluation.  She states that she is constantly at risk of rolling her ankle.  She feels extremely unstable when walking on uneven surfaces.  She also has intermittent pain.  This is been ongoing ever since the ankle injury in 2018.  Patient states that she has some questions prior to surgery.  She would like to have them addressed today.  Her aunt is actually present today as well  Past Medical History:  Diagnosis Date  . ADHD (attention deficit hyperactivity disorder)   . Anorexia   . Anxiety   . Asthma   . B12 deficiency   . Bulimia   . Depression   . Morbid obesity (HCC)      Physical Exam: General: The patient is alert and oriented x3 in no acute distress.  Dermatology: Skin is warm, dry and supple bilateral lower extremities. Negative for open lesions or macerations.  Vascular: Palpable pedal pulses bilaterally. No edema or erythema noted. Capillary refill within normal limits.  Neurological: Epicritic and protective threshold grossly intact bilaterally.   Musculoskeletal Exam: Range of motion within normal limits to all pedal and ankle joints bilateral. Muscle strength 5/5 in all groups bilateral.  There is some generalized pain on palpation to the lateral aspect of the left ankle joint.  Positive anterior drawer sign.  MRI impression 12/20/2019 LT ankle:  Ligaments Lateral: Thinned, but intact anterior talofibular ligament. Calcaneofibular ligament intact. Posterior talofibular ligament intact. Anterior and posterior tibiofibular ligaments intact.   1. Thinned, but intact anterior talofibular ligament, consistent with prior injury. 2. Otherwise unremarkable MRI of the left ankle.  Assessment: 1.  History of left ankle sprain 2.  Chronic left ankle instability   Plan of Care:   1. Patient evaluated.  MRI reviewed again today.  Surgery scheduled for 02/23/2020. 2.  Again today we discussed the conservative versus surgical management of the presenting pathology. The patient opts for surgical management. All possible complications and details of the procedure were explained. All patient questions were answered. No guarantees were expressed or implied. 3.  Surgical details and all questions were answered today.  Surgery will consist of lateral ankle ligament stabilization using an Arthrex Internal Brace system. 4.  Patient would like to have medical clearance from her PCP prior to surgery.   5.  Return to clinic 1 week postop        Felecia Shelling, DPM [facility]  Dr. Felecia Shelling, DPM    [address] [place], [place] [address]                Office [phone]  Fax [phone]

## 2020-02-16 ENCOUNTER — Telehealth: Payer: Self-pay

## 2020-02-16 NOTE — Telephone Encounter (Signed)
DOS 02/23/2020  REPAIR ANKLE LT - [id number]  [employer] EFFECTIVE DATE - 04/14/2019  PLAN DEDUCTIBLE - $850.00 W/ $850.00 REMAINING OUT OF POCKET - $2500.00 W/ $2385.00 REMAINING COPAY $0.00 COINSURANCE - 20%  SPOKE TO JANE AT [employer],SHE STATED NO PRECERT REQUIRED FOR CPT [phone]. CALL REF # [id number]

## 2020-02-21 DIAGNOSIS — F5002 Anorexia nervosa, binge eating/purging type: Secondary | ICD-10-CM | POA: Diagnosis not present

## 2020-02-22 DIAGNOSIS — Z01810 Encounter for preprocedural cardiovascular examination: Secondary | ICD-10-CM | POA: Diagnosis not present

## 2020-02-22 DIAGNOSIS — M25372 Other instability, left ankle: Secondary | ICD-10-CM | POA: Diagnosis not present

## 2020-02-23 ENCOUNTER — Other Ambulatory Visit: Payer: Self-pay | Admitting: Podiatry

## 2020-02-23 DIAGNOSIS — M25372 Other instability, left ankle: Secondary | ICD-10-CM | POA: Diagnosis not present

## 2020-02-23 DIAGNOSIS — S93412A Sprain of calcaneofibular ligament of left ankle, initial encounter: Secondary | ICD-10-CM | POA: Diagnosis not present

## 2020-02-23 DIAGNOSIS — M25572 Pain in left ankle and joints of left foot: Secondary | ICD-10-CM | POA: Diagnosis not present

## 2020-02-23 MED ORDER — OXYCODONE-ACETAMINOPHEN 5-325 MG PO TABS
1.0000 | ORAL_TABLET | ORAL | 0 refills | Status: DC | PRN
Start: 2020-02-23 — End: 2020-05-22

## 2020-02-23 MED ORDER — IBUPROFEN 800 MG PO TABS: 800.0000 mg | ORAL_TABLET | Freq: Three times a day (TID) | ORAL | 1 refills | Status: DC

## 2020-02-23 NOTE — Progress Notes (Signed)
PRN postop 

## 2020-02-28 ENCOUNTER — Ambulatory Visit: Payer: BC Managed Care – PPO | Admitting: Allergy & Immunology

## 2020-02-29 ENCOUNTER — Ambulatory Visit (INDEPENDENT_AMBULATORY_CARE_PROVIDER_SITE_OTHER): Payer: BC Managed Care – PPO | Admitting: Podiatry

## 2020-02-29 ENCOUNTER — Other Ambulatory Visit: Payer: Self-pay

## 2020-02-29 ENCOUNTER — Other Ambulatory Visit: Payer: Self-pay | Admitting: Podiatry

## 2020-02-29 ENCOUNTER — Ambulatory Visit (INDEPENDENT_AMBULATORY_CARE_PROVIDER_SITE_OTHER): Payer: BC Managed Care – PPO

## 2020-02-29 DIAGNOSIS — Z9889 Other specified postprocedural states: Secondary | ICD-10-CM

## 2020-02-29 DIAGNOSIS — Z713 Dietary counseling and surveillance: Secondary | ICD-10-CM | POA: Diagnosis not present

## 2020-02-29 NOTE — Progress Notes (Signed)
   Subjective:  Patient presents today status post lateral ankle ligament reconstruction left. DOS: 02/23/2020.  Patient states she is feeling well.  She does have some intermittent aching and throbbing.  She has been minimally weightbearing in the cam boot as instructed.  No new complaints at this time  Past Medical History:  Diagnosis Date  . ADHD (attention deficit hyperactivity disorder)   . Anorexia   . Anxiety   . Asthma   . B12 deficiency   . Bulimia   . Depression   . Morbid obesity (HCC)       Objective/Physical Exam Neurovascular status intact.  Skin incisions appear to be well coapted with sutures intact. No sign of infectious process noted. No dehiscence. No active bleeding noted. Moderate edema noted to the surgical extremity.  Radiographic Exam:  Normal osseous mineralization.  No significant change since prior x-rays.  Ankle joint intact.  Tibiotalar joint in a good rectus position  Assessment: 1. s/p ATFL ligament repair left ankle. DOS: 02/23/2020   Plan of Care:  1. Patient was evaluated. X-rays reviewed 2.  Dressings changed today.  Clean dry and intact x1 week 3.  Continue minimal weightbearing in the cam boot 4.  Return to clinic in 1 week for suture removal and to initiate weightbearing in tennis shoes and possible physical therapy   Felecia Shelling, DPM [facility]  Dr. Felecia Shelling, DPM    [address] [place], [place] [address]                Office [phone]  Fax [phone]

## 2020-03-06 DIAGNOSIS — F5002 Anorexia nervosa, binge eating/purging type: Secondary | ICD-10-CM | POA: Diagnosis not present

## 2020-03-08 ENCOUNTER — Other Ambulatory Visit: Payer: Self-pay | Admitting: Pediatrics

## 2020-03-08 DIAGNOSIS — K219 Gastro-esophageal reflux disease without esophagitis: Secondary | ICD-10-CM

## 2020-03-12 ENCOUNTER — Ambulatory Visit (INDEPENDENT_AMBULATORY_CARE_PROVIDER_SITE_OTHER): Payer: BC Managed Care – PPO | Admitting: Podiatry

## 2020-03-12 ENCOUNTER — Other Ambulatory Visit: Payer: Self-pay

## 2020-03-12 DIAGNOSIS — Z9889 Other specified postprocedural states: Secondary | ICD-10-CM

## 2020-03-12 DIAGNOSIS — M25372 Other instability, left ankle: Secondary | ICD-10-CM

## 2020-03-12 NOTE — Progress Notes (Signed)
   Subjective:  Patient presents today status post lateral ankle ligament reconstruction left. DOS: 02/23/2020.  Patient states she is feeling well.  Overall the patient states there is significant improvement.  She is feeling very well with no new complaints at this time  Past Medical History:  Diagnosis Date  . ADHD (attention deficit hyperactivity disorder)   . Anorexia   . Anxiety   . Asthma   . B12 deficiency   . Bulimia   . Depression   . Morbid obesity (HCC)       Objective/Physical Exam Neurovascular status intact.  Skin incisions appear to be well coapted with sutures intact. No sign of infectious process noted. No dehiscence. No active bleeding noted. Moderate edema noted to the surgical extremity.  Radiographic Exam:  Normal osseous mineralization.  No significant change since prior x-rays.  Ankle joint intact.  Tibiotalar joint in a good rectus position  Assessment: 1. s/p ATFL ligament repair left ankle. DOS: 02/23/2020   Plan of Care:  1. Patient was evaluated. X-rays reviewed 2.  Sutures removed today. 3.  Order placed for physical therapy at [facility] 4.  Patient may resume work.  Return to work weightbearing as tolerated in the cam boot for the next 2 weeks.  Rest as needed 5.  Return to clinic in 4 weeks   Felecia Shelling, DPM [facility]  Dr. Felecia Shelling, DPM    [address] [place], [place] [address]                Office [phone]  Fax [phone]

## 2020-03-12 NOTE — Addendum Note (Signed)
Addended by: Francesca Oman on: 03/12/2020 02:39 PM   Modules accepted: Orders

## 2020-03-20 DIAGNOSIS — F5002 Anorexia nervosa, binge eating/purging type: Secondary | ICD-10-CM | POA: Diagnosis not present

## 2020-03-23 DIAGNOSIS — M6281 Muscle weakness (generalized): Secondary | ICD-10-CM | POA: Diagnosis not present

## 2020-03-23 DIAGNOSIS — M25572 Pain in left ankle and joints of left foot: Secondary | ICD-10-CM | POA: Diagnosis not present

## 2020-03-23 DIAGNOSIS — M25472 Effusion, left ankle: Secondary | ICD-10-CM | POA: Diagnosis not present

## 2020-03-23 DIAGNOSIS — M25672 Stiffness of left ankle, not elsewhere classified: Secondary | ICD-10-CM | POA: Diagnosis not present

## 2020-03-26 ENCOUNTER — Encounter: Payer: BC Managed Care – PPO | Admitting: Podiatry

## 2020-03-27 DIAGNOSIS — M25672 Stiffness of left ankle, not elsewhere classified: Secondary | ICD-10-CM | POA: Diagnosis not present

## 2020-03-27 DIAGNOSIS — M25472 Effusion, left ankle: Secondary | ICD-10-CM | POA: Diagnosis not present

## 2020-03-27 DIAGNOSIS — M25572 Pain in left ankle and joints of left foot: Secondary | ICD-10-CM | POA: Diagnosis not present

## 2020-03-27 DIAGNOSIS — M6281 Muscle weakness (generalized): Secondary | ICD-10-CM | POA: Diagnosis not present

## 2020-04-03 DIAGNOSIS — F5002 Anorexia nervosa, binge eating/purging type: Secondary | ICD-10-CM | POA: Diagnosis not present

## 2020-04-17 DIAGNOSIS — F5002 Anorexia nervosa, binge eating/purging type: Secondary | ICD-10-CM | POA: Diagnosis not present

## 2020-04-25 IMAGING — MR MR CERVICAL SPINE W/O CM
5 series · 30 of 48 positions shown · non-contrast
Comparison: None.

CLINICAL DATA: Neck pain radiating to left upper extremity PICC

EXAM:
MRI CERVICAL SPINE WITHOUT CONTRAST
TECHNIQUE: Multiplanar, multisequence MR imaging of the cervical spine was
performed. No intravenous contrast was administered.

[Series 6: T1 · sagittal · 3.0mm · 0.66mm/px · 6 of 13 slices shown]
[im 1/13]
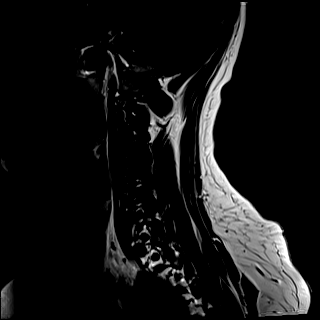
[im 3/13]
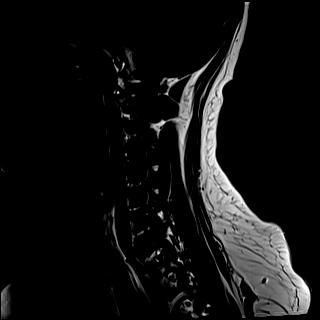
[im 5/13]
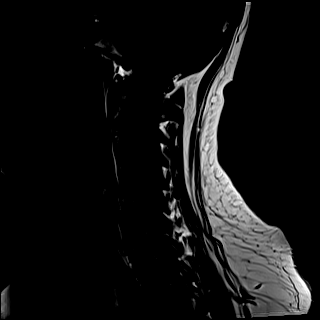
[im 8/13]
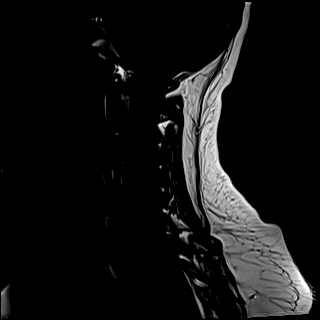
[im 10/13]
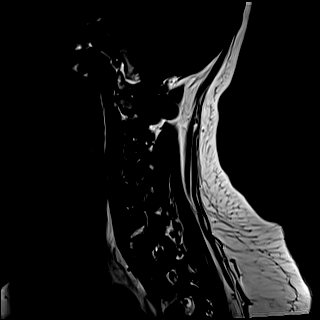
[im 13/13]
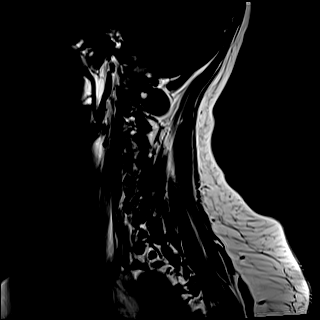

[Series 7: T2 · sagittal · 3.0mm · 0.55mm/px · 7 of 13 slices shown (1 of 2)]
[im 1/13]
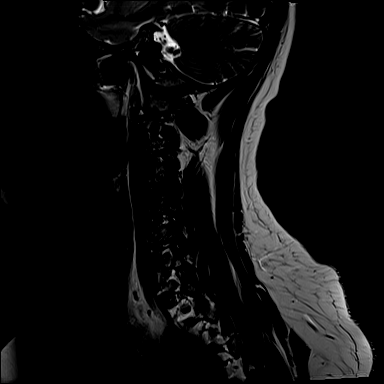
[im 3/13]
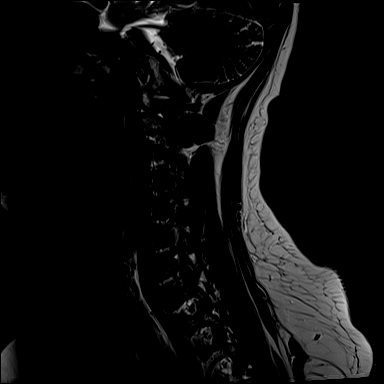
[im 5/13]
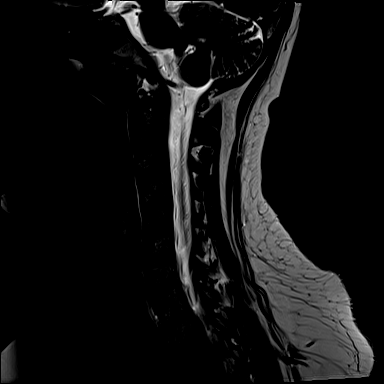
[im 7/13]
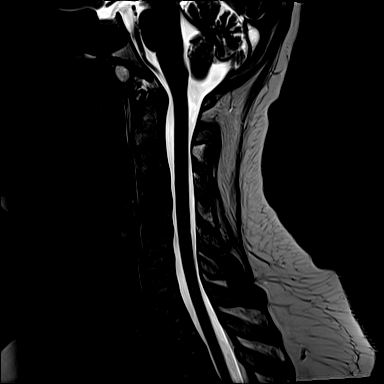
[im 9/13]
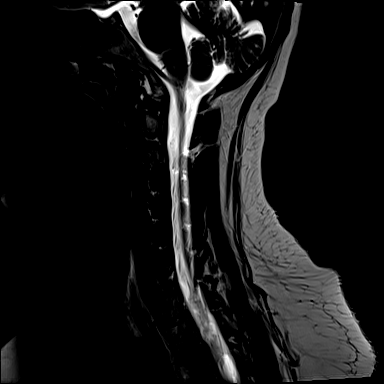
[im 11/13]
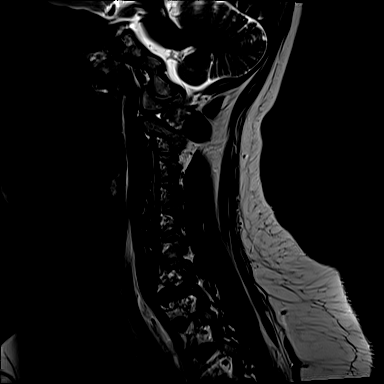
[im 13/13]
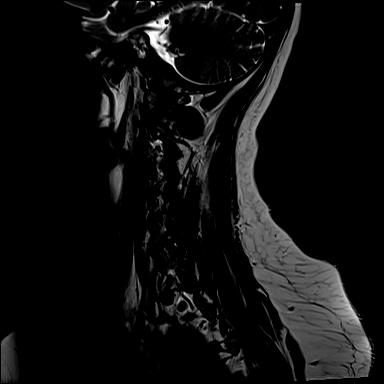

[Series 8: STIR · sagittal · 3.0mm · 0.33mm/px · 7 of 13 slices shown]
[im 1/13]
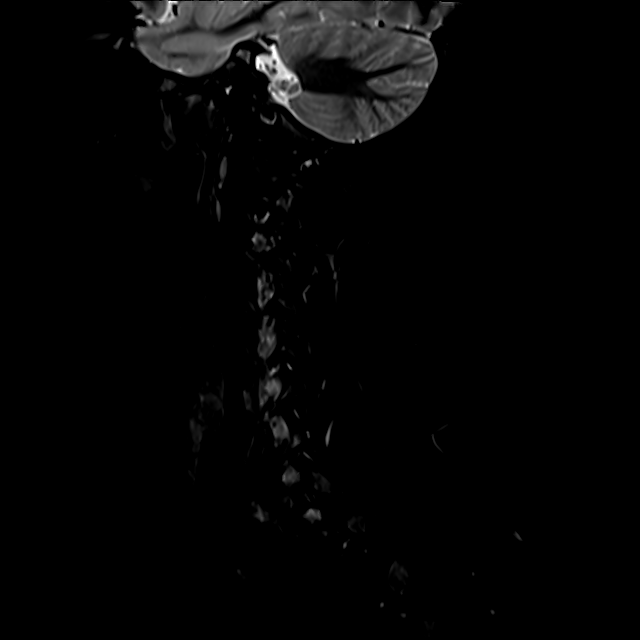
[im 3/13]
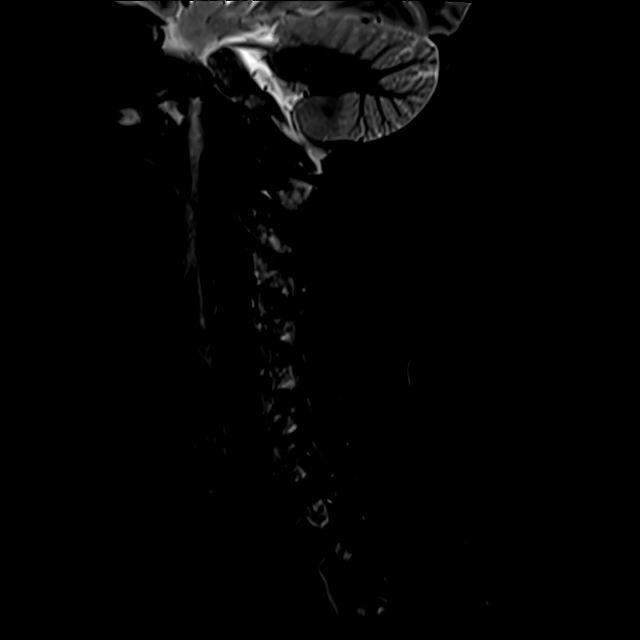
[im 5/13]
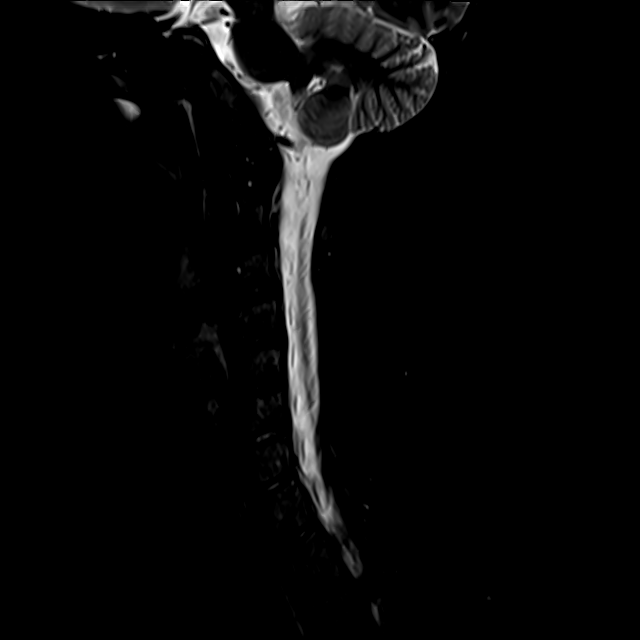
[im 7/13]
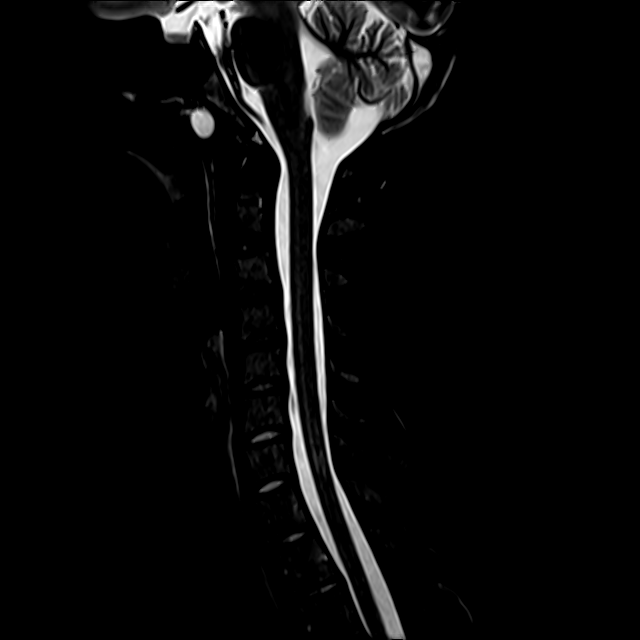
[im 9/13]
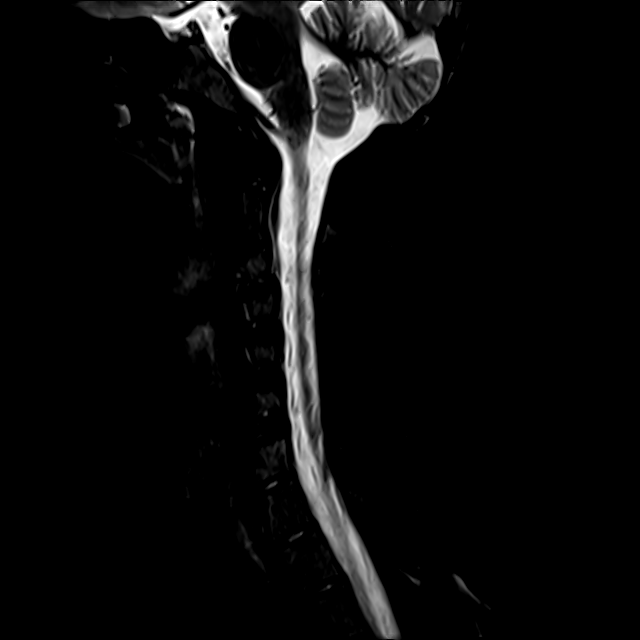
[im 11/13]
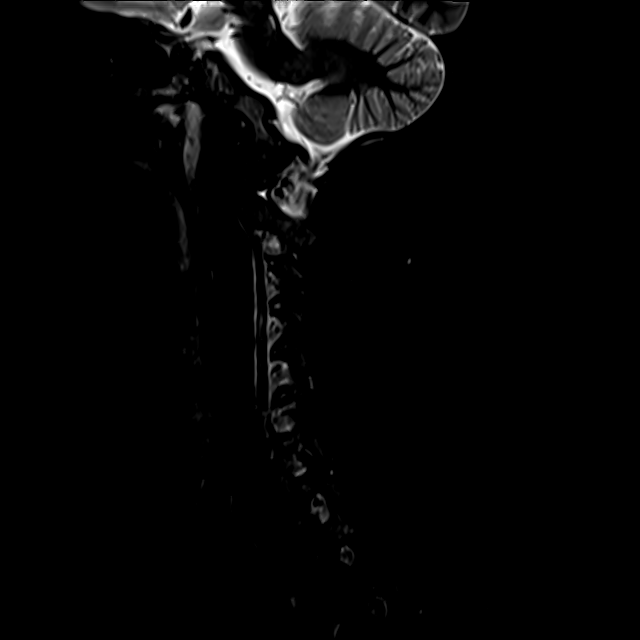
[im 13/13]
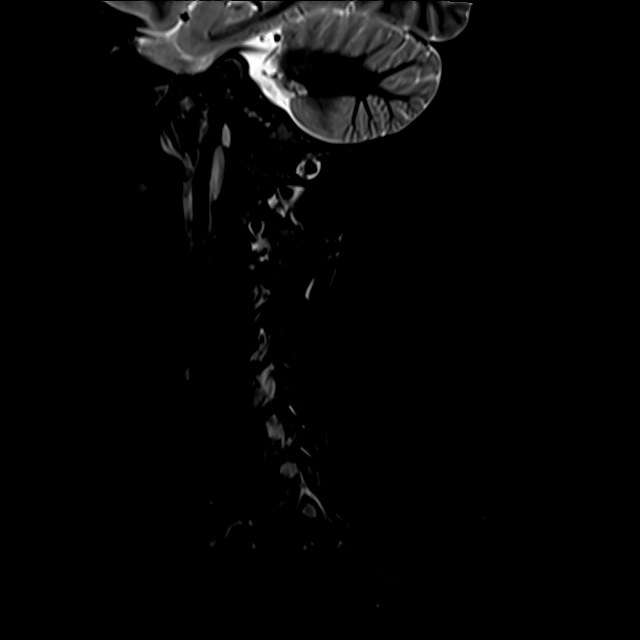

[Series 9: T2 · axial · 3.5mm · 0.50mm/px · z∈[-33,+65]mm · 8 of 27 slices shown (2 of 2)]
[im 1/27]
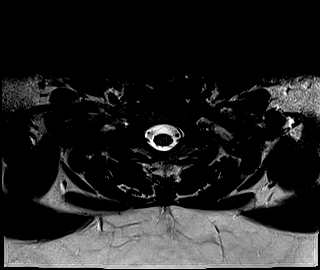
[im 5/27]
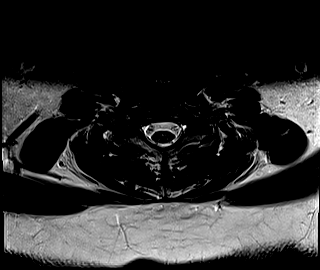
[im 9/27]
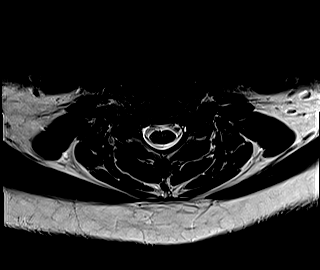
[im 13/27]
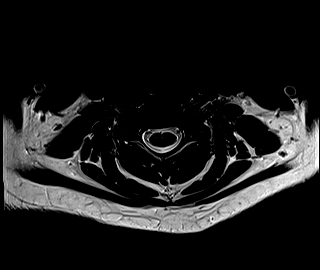
[im 15/27]
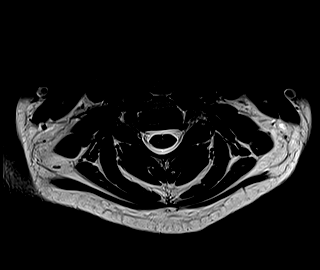
[im 19/27]
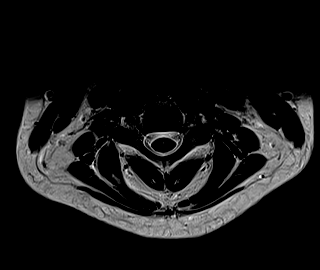
[im 23/27]
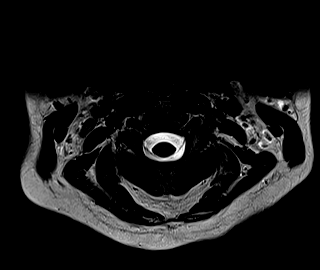
[im 27/27]
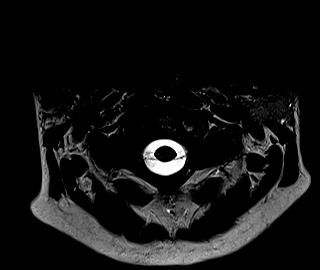

[Series 10: GRE · axial · 3.5mm · 0.83mm/px · z∈[-34,-18]mm · 2 of 27 slices shown]
[im 1/27]
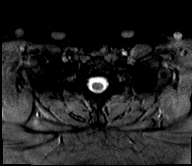
[im 5/27]
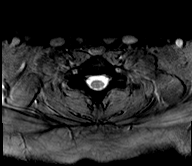

[30 of 48 positions shown; findings below may reference images not displayed]

FINDINGS: Alignment: Normal.

Vertebrae: No focal marrow lesion. No compression fracture or
evidence of discitis osteomyelitis.

Cord: Normal caliber and signal.

Posterior Fossa, vertebral arteries, paraspinal tissues: Visualized
posterior fossa is normal. Vertebral artery flow voids are
preserved. No prevertebral effusion. Incidentally noted Tornwaldt
cyst in the nasopharynx.

Disc levels:

The C1-2 articulation is normal.

The C2-T1 levels are normal.

T1-T2 is imaged in the sagittal plane only, with no visible disc
herniation or stenosis.
IMPRESSION: Normal cervical spine MRI.

## 2020-05-14 ENCOUNTER — Telehealth: Payer: Self-pay | Admitting: Pediatrics

## 2020-05-14 NOTE — Telephone Encounter (Signed)
Please call Mom she is saying that Emily [name] is not doing ok very depress and mom is concern about her please call @ [phone] I schedule a appt for the 10th of this month

## 2020-05-15 DIAGNOSIS — F5002 Anorexia nervosa, binge eating/purging type: Secondary | ICD-10-CM | POA: Diagnosis not present

## 2020-05-15 NOTE — Telephone Encounter (Signed)
Called to schedule sooner appointment with provider to discuss med management upon concerns of mother. Called number on file, no answer, left VM to call office back to schedule sooner appointment with office.

## 2020-05-22 ENCOUNTER — Ambulatory Visit (INDEPENDENT_AMBULATORY_CARE_PROVIDER_SITE_OTHER): Payer: BC Managed Care – PPO | Admitting: Pediatrics

## 2020-05-22 ENCOUNTER — Other Ambulatory Visit: Payer: Self-pay

## 2020-05-22 VITALS — BP 149/91 | HR 89 | Ht 64.17 in | Wt 307.2 lb

## 2020-05-22 DIAGNOSIS — E782 Mixed hyperlipidemia: Secondary | ICD-10-CM

## 2020-05-22 DIAGNOSIS — H04129 Dry eye syndrome of unspecified lacrimal gland: Secondary | ICD-10-CM | POA: Diagnosis not present

## 2020-05-22 DIAGNOSIS — G479 Sleep disorder, unspecified: Secondary | ICD-10-CM

## 2020-05-22 DIAGNOSIS — F4323 Adjustment disorder with mixed anxiety and depressed mood: Secondary | ICD-10-CM

## 2020-05-22 DIAGNOSIS — R5382 Chronic fatigue, unspecified: Secondary | ICD-10-CM | POA: Diagnosis not present

## 2020-05-22 DIAGNOSIS — F5002 Anorexia nervosa, binge eating/purging type: Secondary | ICD-10-CM | POA: Diagnosis not present

## 2020-05-22 DIAGNOSIS — I10 Essential (primary) hypertension: Secondary | ICD-10-CM

## 2020-05-22 DIAGNOSIS — E559 Vitamin D deficiency, unspecified: Secondary | ICD-10-CM | POA: Diagnosis not present

## 2020-05-22 DIAGNOSIS — F902 Attention-deficit hyperactivity disorder, combined type: Secondary | ICD-10-CM

## 2020-05-22 DIAGNOSIS — N946 Dysmenorrhea, unspecified: Secondary | ICD-10-CM

## 2020-05-22 MED ORDER — LOTEPREDNOL ETABONATE 0.5 % OP SUSP: OPHTHALMIC | 3 refills | Status: DC

## 2020-05-22 MED ORDER — LISINOPRIL 10 MG PO TABS: 10.0000 mg | ORAL_TABLET | Freq: Every day | ORAL | 3 refills | Status: DC

## 2020-05-22 MED ORDER — VENLAFAXINE HCL ER 150 MG PO CP24: ORAL_CAPSULE | ORAL | 3 refills | Status: DC

## 2020-05-22 MED ORDER — VENLAFAXINE HCL ER 75 MG PO CP24: ORAL_CAPSULE | ORAL | 3 refills | Status: DC

## 2020-05-22 MED ORDER — NAPROXEN 500 MG PO TABS
500.0000 mg | ORAL_TABLET | Freq: Two times a day (BID) | ORAL | 1 refills | Status: DC | PRN
Start: 2020-05-22 — End: 2020-07-18

## 2020-05-22 NOTE — Progress Notes (Signed)
History was provided by the patient and mother.  Emily [name] is a 20 y.o. female who is here for anxiety, depression, ADHD, hypertension, dysmenorrhea.  Geoffry Paradise, MD   HPI:  Pt reports that she has continued to have severe depression. She was supposed to restart school but had issues with financial stuff. Says she put too much pressure on herself in school which caused her to collapse. 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   Mom says she thinks Maddie is scared to change her meds because it took so long to figure out something that worked. Mom says she sees a different depression than she has before- feeling really hopeless. Has had some passive SI. Has talked to therapist about some of her sx being related to ADHD that she is not taking her medication for.   Had ankle surgery and is in some pain from that.   Mom reports that her sleep is really off- staying up really late and then tired during the day.   She is meant to have been taking B12 injections but has not gone to PCP or to our office for these.   Privately, she reports her mom found out she cancelled last two appts and got very angry with her. Would like to ensure her mom can't receive any of her medical or appointment info.   Continues on OCP without concern. She is continuous cycling and is not having any bleeding.   No LMP recorded. (Menstrual status: Oral contraceptives).   Patient Active Problem List   Diagnosis Date Noted  . Dry eye 05/25/2020  . Vitamin D deficiency 05/25/2020  . Epistaxis, recurrent 01/05/2020  . Ankle weakness 12/01/2019  . Mixed hyperlipidemia 12/01/2019  . BMI 50.0-59.9, adult (HCC) 05/12/2019  . Chronic fatigue 05/12/2019  . Acute left-sided low back pain without sciatica 12/09/2017  . Chronic left-sided low back pain without sciatica 12/02/2017  . Interstitial cystitis (chronic) with hematuria 11/26/2017  . 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  Hypertension 03/25/2017  . Midline thoracic back pain 09/27/2015  . Acne vulgaris 09/27/2015  . Anorexia nervosa, binge-eating purging type 04/11/2015  . Dysmenorrhea 02/27/2015  . Sleep disturbance 11/18/2014  . ADHD (attention deficit hyperactivity disorder) 08/24/2014  . Adjustment disorder with mixed anxiety and depressed mood 08/23/2014    Current Outpatient Medications on File Prior to Visit  Medication Sig Dispense Refill  . albuterol (VENTOLIN HFA) 108 (90 Base) MCG/ACT inhaler Inhale 2 puffs into the lungs every 4 (four) hours as needed for wheezing or shortness of breath. 18 g 1  . ALTAVERA 0.15-30 MG-MCG tablet TAKE 1 TABLET DAILY BY MOUTH. SKIP PLACEBO PILLS. CONTINUOUS CYCLE FOR 3 MONTHS. 112 tablet 3  . clindamycin (CLEOCIN T) 1 % lotion     . omeprazole (PRILOSEC) 20 MG capsule TAKE 1 CAPSULE BY MOUTH EVERY DAY 90 capsule 0  . tretinoin (RETIN-A) 0.025 % cream Apply topically at bedtime. 45 g 3  . [DISCONTINUED] pantoprazole (PROTONIX) 40 MG tablet TAKE 1 TABLET BY MOUTH EVERY DAY 90 tablet 1   No current facility-administered medications on file prior to visit.    Allergies  Allergen Reactions  . Dust Mite Extract Other (See Comments)    Sneezing, coughing, runny nose Sneezing, coughing, runny nose     Physical Exam:    Vitals:   05/22/20 1038  BP: (!) 149/91  Pulse: 89  Weight: (!) 307 lb 3.2 oz (139.3 kg)  Height: 5' 4.17" (1.63  m)    Growth percentile SmartLinks can only be used for patients less than 67 years old.  Physical Exam Vitals and nursing note reviewed.  Constitutional:      General: She is not in acute distress.    Appearance: She is well-developed.  Neck:     Thyroid: No thyromegaly.  Cardiovascular:     Rate and Rhythm: Normal rate and regular rhythm.     Heart sounds: No murmur heard.   Pulmonary:     Breath sounds: Normal breath sounds.  Abdominal:     Palpations: Abdomen is soft. There is no mass.     Tenderness: There is no  abdominal tenderness. There is no guarding.  Musculoskeletal:     Right lower leg: No edema.     Left lower leg: No edema.  Lymphadenopathy:     Cervical: No cervical adenopathy.  Skin:    General: Skin is warm.     Capillary Refill: Capillary refill takes less than 2 seconds.     Findings: No rash.  Neurological:     General: No focal deficit present.     Mental Status: She is alert.     Comments: No tremor  Psychiatric:        Mood and Affect: Affect normal. Mood is anxious.     Assessment/Plan: 1. Primary hypertension Restart lisinopril. May also benefit from combo with HCTZ.  - lisinopril (PRINIVIL) 10 MG tablet; Take 1 tablet (10 mg total) by mouth daily.  Dispense: 30 tablet; Refill: 3 - Comprehensive metabolic panel - CBC with Differential/Platelet  2. Dry eye Eye drops re-ordered.   3. Adjustment disorder with mixed anxiety and depressed mood Will increase effexor for now. I also suspect that vit d, iron and b12 may be playing a role.  - venlafaxine XR (EFFEXOR-XR) 150 MG 24 hr capsule; Take 150 mg and 75 mg together for 225 mg  Dispense: 30 capsule; Refill: 3  4. Sleep disturbance Needs to work on establishing appropriate sleep hygeine and pattern. Will use melatonin as needed.   5. Dysmenorrhea Continue ocp.   6. Chronic fatigue Will repeat labs today. Suspect multifactorial.  - Thyroid Panel With TSH - Ferritin - B12 and Folate Panel  7. Anorexia nervosa, binge-eating purging type Stable, though does tend to over-eat. Would like to establish with a new dietitian if possible as her old dietitian no longer accepts her insurance.   8. Attention deficit hyperactivity disorder (ADHD), combined type Restart concerta. We discussed how this can impact mood when untreated.   9. Vitamin D deficiency Repeat vit D today.  - VITAMIN D 25 Hydroxy (Vit-D Deficiency, Fractures)  10. Mixed hyperlipidemia Hx of HDL, will repeat.  - Lipid panel  Return in 2 weeks.    Alfonso Ramus, FNP   Level of Service: This visit lasted in excess of 40 minutes. More than 50% of the visit was devoted to counseling regarding worsening mental health, physical health, blood pressure management.

## 2020-05-22 NOTE — Patient Instructions (Signed)
Dietitian  Address: [address] [place], [place] [address]   Phone: [phone]   Fax: [phone]   Email: [email or web address]  Increase effexor dose to 225 mg daily  Restart methylphenidate daily  Restart lisinopril daily  We will see you in 2 weeks

## 2020-05-23 LAB — VITAMIN D 25 HYDROXY (VIT D DEFICIENCY, FRACTURES): Vit D, 25-Hydroxy: 21 ng/mL — ABNORMAL LOW (ref 30–100)

## 2020-05-23 LAB — CBC WITH DIFFERENTIAL/PLATELET
Absolute Monocytes: 1096 cells/uL — ABNORMAL HIGH (ref 200–950)
Basophils Absolute: 66 cells/uL (ref 0–200)
Basophils Relative: 0.5 %
Eosinophils Absolute: 172 cells/uL (ref 15–500)
Eosinophils Relative: 1.3 %
HCT: 39.8 % (ref 35.0–45.0)
Hemoglobin: 12.8 g/dL (ref 11.7–15.5)
Lymphs Abs: 4765 cells/uL — ABNORMAL HIGH (ref 850–3900)
MCH: 29.6 pg (ref 27.0–33.0)
MCHC: 32.2 g/dL (ref 32.0–36.0)
MCV: 91.9 fL (ref 80.0–100.0)
MPV: 10.3 fL (ref 7.5–12.5)
Monocytes Relative: 8.3 %
Neutro Abs: 7102 cells/uL (ref 1500–7800)
Neutrophils Relative %: 53.8 %
Platelets: 442 10*3/uL — ABNORMAL HIGH (ref 140–400)
RBC: 4.33 10*6/uL (ref 3.80–5.10)
RDW: 13.8 % (ref 11.0–15.0)
Total Lymphocyte: 36.1 %
WBC: 13.2 10*3/uL — ABNORMAL HIGH (ref 3.8–10.8)

## 2020-05-23 LAB — B12 AND FOLATE PANEL
Folate: 8 ng/mL
Vitamin B-12: 162 pg/mL — ABNORMAL LOW (ref 200–1100)

## 2020-05-23 LAB — COMPREHENSIVE METABOLIC PANEL
AG Ratio: 1.6 (calc) (ref 1.0–2.5)
ALT: 12 U/L (ref 6–29)
AST: 13 U/L (ref 10–30)
Albumin: 3.9 g/dL (ref 3.6–5.1)
Alkaline phosphatase (APISO): 38 U/L (ref 31–125)
BUN: 15 mg/dL (ref 7–25)
CO2: 22 mmol/L (ref 20–32)
Calcium: 9.2 mg/dL (ref 8.6–10.2)
Chloride: 106 mmol/L (ref 98–110)
Creat: 0.67 mg/dL (ref 0.50–1.10)
Globulin: 2.5 g/dL (calc) (ref 1.9–3.7)
Glucose, Bld: 103 mg/dL — ABNORMAL HIGH (ref 65–99)
Potassium: 4.5 mmol/L (ref 3.5–5.3)
Sodium: 138 mmol/L (ref 135–146)
Total Bilirubin: 0.2 mg/dL (ref 0.2–1.2)
Total Protein: 6.4 g/dL (ref 6.1–8.1)

## 2020-05-23 LAB — LIPID PANEL
Cholesterol: 248 mg/dL — ABNORMAL HIGH (ref <200)
HDL: 46 mg/dL — ABNORMAL LOW (ref >=50)
LDL Cholesterol (Calc): 162 mg/dL (calc) — ABNORMAL HIGH
Non-HDL Cholesterol (Calc): 202 mg/dL (calc) — ABNORMAL HIGH (ref <130)
Total CHOL/HDL Ratio: 5.4 (calc) — ABNORMAL HIGH (ref <5.0)
Triglycerides: 238 mg/dL — ABNORMAL HIGH (ref <150)

## 2020-05-23 LAB — THYROID PANEL WITH TSH
Free Thyroxine Index: 2.4 (ref 1.4–3.8)
T3 Uptake: 20 % — ABNORMAL LOW (ref 22–35)
T4, Total: 11.8 ug/dL (ref 5.1–11.9)
TSH: 4.32 mIU/L

## 2020-05-23 LAB — FERRITIN: Ferritin: 7 ng/mL — ABNORMAL LOW (ref 16–154)

## 2020-05-25 ENCOUNTER — Telehealth: Payer: Self-pay | Admitting: Pediatrics

## 2020-05-25 DIAGNOSIS — E559 Vitamin D deficiency, unspecified: Secondary | ICD-10-CM | POA: Insufficient documentation

## 2020-05-25 DIAGNOSIS — E538 Deficiency of other specified B group vitamins: Secondary | ICD-10-CM

## 2020-05-25 DIAGNOSIS — E611 Iron deficiency: Secondary | ICD-10-CM

## 2020-05-25 DIAGNOSIS — H04129 Dry eye syndrome of unspecified lacrimal gland: Secondary | ICD-10-CM | POA: Insufficient documentation

## 2020-05-25 MED ORDER — CYANOCOBALAMIN 1000 MCG/ML IJ SOLN: 1000.0000 ug | INTRAMUSCULAR | 0 refills | Status: DC

## 2020-05-25 MED ORDER — FERROUS FUMARATE 325 (106 FE) MG PO TABS: ORAL_TABLET | ORAL | 1 refills | Status: DC

## 2020-05-25 NOTE — Telephone Encounter (Signed)
Spoke with [place] about labs. We discussed low b12, low iron, low vit D and how all these contribute to mood and what to do about each. Sent rx to pharmacy as well as recommendations of OTC things she will need. We will give b12 injections 1-3x/week for 4 weeks and then weekly after that x 3 months. She is at risk for ongoing b12 deficiency due to vegetarian diet as well as PPI use. Also discussed increasing concerta as she has not seen effect of 27 mg dose she has. She was in agreement.

## 2020-05-29 ENCOUNTER — Other Ambulatory Visit: Payer: Self-pay | Admitting: Pediatrics

## 2020-05-29 ENCOUNTER — Ambulatory Visit (INDEPENDENT_AMBULATORY_CARE_PROVIDER_SITE_OTHER): Payer: BC Managed Care – PPO | Admitting: Pediatrics

## 2020-05-29 DIAGNOSIS — E538 Deficiency of other specified B group vitamins: Secondary | ICD-10-CM

## 2020-05-29 DIAGNOSIS — R350 Frequency of micturition: Secondary | ICD-10-CM

## 2020-05-29 DIAGNOSIS — F5002 Anorexia nervosa, binge eating/purging type: Secondary | ICD-10-CM | POA: Diagnosis not present

## 2020-05-29 MED ORDER — METHYLPHENIDATE HCL ER (OSM) 54 MG PO TBCR: 54.0000 mg | EXTENDED_RELEASE_TABLET | Freq: Every day | ORAL | 0 refills | Status: DC

## 2020-05-30 ENCOUNTER — Telehealth: Payer: Self-pay

## 2020-05-30 LAB — POCT URINALYSIS DIPSTICK
Bilirubin, UA: NEGATIVE
Blood, UA: POSITIVE
Glucose, UA: NEGATIVE
Ketones, UA: NEGATIVE
Leukocytes, UA: NEGATIVE
Nitrite, UA: NEGATIVE
Protein, UA: POSITIVE — AB
Spec Grav, UA: 1.025 (ref 1.010–1.025)
Urobilinogen, UA: 0.2 E.U./dL
pH, UA: 5 (ref 5.0–8.0)

## 2020-05-30 LAB — URINE CULTURE
MICRO NUMBER:: 11900581
SPECIMEN QUALITY:: ADEQUATE

## 2020-05-30 NOTE — Progress Notes (Signed)
Patient here for urine sample in the setting of polyuria and elevated WBC. Sample provided. Dipstick without leuks or nitrites but + blood (has interstitial cystitis) and protein. Sent for culture.   Alfonso Ramus, FNP

## 2020-05-30 NOTE — Telephone Encounter (Signed)
Reighn called to check in on results of urine culture and requesting prescription if needed. [facility] results pending. She should receive a call once urine culture has been finalized. Callyn stated appreciation and understanding.

## 2020-06-05 ENCOUNTER — Encounter: Payer: Self-pay | Admitting: Pediatrics

## 2020-06-05 ENCOUNTER — Ambulatory Visit (INDEPENDENT_AMBULATORY_CARE_PROVIDER_SITE_OTHER): Payer: BC Managed Care – PPO | Admitting: Pediatrics

## 2020-06-05 ENCOUNTER — Other Ambulatory Visit: Payer: Self-pay

## 2020-06-05 VITALS — BP 136/96 | HR 116 | Ht 63.39 in | Wt 306.0 lb

## 2020-06-05 DIAGNOSIS — G479 Sleep disorder, unspecified: Secondary | ICD-10-CM

## 2020-06-05 DIAGNOSIS — I10 Essential (primary) hypertension: Secondary | ICD-10-CM

## 2020-06-05 DIAGNOSIS — F5002 Anorexia nervosa, binge eating/purging type: Secondary | ICD-10-CM

## 2020-06-05 DIAGNOSIS — Z113 Encounter for screening for infections with a predominantly sexual mode of transmission: Secondary | ICD-10-CM | POA: Diagnosis not present

## 2020-06-05 DIAGNOSIS — F902 Attention-deficit hyperactivity disorder, combined type: Secondary | ICD-10-CM | POA: Diagnosis not present

## 2020-06-05 DIAGNOSIS — Z1389 Encounter for screening for other disorder: Secondary | ICD-10-CM | POA: Diagnosis not present

## 2020-06-05 DIAGNOSIS — N3011 Interstitial cystitis (chronic) with hematuria: Secondary | ICD-10-CM

## 2020-06-05 DIAGNOSIS — F4323 Adjustment disorder with mixed anxiety and depressed mood: Secondary | ICD-10-CM | POA: Diagnosis not present

## 2020-06-05 DIAGNOSIS — E538 Deficiency of other specified B group vitamins: Secondary | ICD-10-CM | POA: Diagnosis not present

## 2020-06-05 LAB — POCT URINALYSIS DIPSTICK
Bilirubin, UA: NEGATIVE
Glucose, UA: NEGATIVE
Ketones, UA: NEGATIVE
Leukocytes, UA: NEGATIVE
Nitrite, UA: NEGATIVE
Protein, UA: NEGATIVE
Spec Grav, UA: 1.015 (ref 1.010–1.025)
Urobilinogen, UA: NEGATIVE E.U./dL — AB
pH, UA: 5 (ref 5.0–8.0)

## 2020-06-05 MED ORDER — CYANOCOBALAMIN 1000 MCG/ML IJ SOLN
1000.0000 ug | Freq: Once | INTRAMUSCULAR | Status: AC
  Administered 2020-06-05: 1000 ug via INTRAMUSCULAR

## 2020-06-05 MED ORDER — DULOXETINE HCL 30 MG PO CPEP: 30.0000 mg | ORAL_CAPSULE | Freq: Every day | ORAL | 3 refills | Status: DC
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
## 2020-06-05 NOTE — Patient Instructions (Addendum)
Medication taper:  Week 1: Duloxetine 30 mg and venlafaxine 150 mg  Week 2: Duloxetine 30 mg and venlafaxine 75 mg   Continue concerta at same dose   b12 today   [facility]- Kasandra Knudsen, MD   [address] [place] [address] [place] [facility] [place], [place] [address]  Location Hours:  8:00 a. m. - 5:00 p. m.  Phone:[phone] Fax:[phone]

## 2020-06-06 LAB — WET PREP BY MOLECULAR PROBE
Candida species: NOT DETECTED
Gardnerella vaginalis: NOT DETECTED
MICRO NUMBER:: 11928920
SPECIMEN QUALITY:: ADEQUATE
Trichomonas vaginosis: NOT DETECTED

## 2020-06-12 DIAGNOSIS — F5002 Anorexia nervosa, binge eating/purging type: Secondary | ICD-10-CM | POA: Diagnosis not present

## 2020-06-15 ENCOUNTER — Other Ambulatory Visit: Payer: Self-pay | Admitting: Family

## 2020-06-15 DIAGNOSIS — N946 Dysmenorrhea, unspecified: Secondary | ICD-10-CM

## 2020-06-18 ENCOUNTER — Ambulatory Visit: Payer: Self-pay | Admitting: Pediatrics

## 2020-06-19 DIAGNOSIS — F5002 Anorexia nervosa, binge eating/purging type: Secondary | ICD-10-CM | POA: Diagnosis not present

## 2020-06-25 ENCOUNTER — Other Ambulatory Visit: Payer: Self-pay | Admitting: Allergy & Immunology

## 2020-06-26 ENCOUNTER — Other Ambulatory Visit: Payer: Self-pay | Admitting: Pediatrics

## 2020-06-26 DIAGNOSIS — E538 Deficiency of other specified B group vitamins: Secondary | ICD-10-CM

## 2020-06-26 DIAGNOSIS — F5002 Anorexia nervosa, binge eating/purging type: Secondary | ICD-10-CM | POA: Diagnosis not present

## 2020-06-28 ENCOUNTER — Other Ambulatory Visit: Payer: Self-pay | Admitting: Pediatrics

## 2020-07-03 ENCOUNTER — Ambulatory Visit: Payer: Self-pay | Admitting: Pediatrics

## 2020-07-05 ENCOUNTER — Other Ambulatory Visit: Payer: Self-pay | Admitting: Pediatrics

## 2020-07-05 DIAGNOSIS — F4323 Adjustment disorder with mixed anxiety and depressed mood: Secondary | ICD-10-CM

## 2020-07-17 DIAGNOSIS — F5002 Anorexia nervosa, binge eating/purging type: Secondary | ICD-10-CM | POA: Diagnosis not present

## 2020-07-18 ENCOUNTER — Other Ambulatory Visit: Payer: Self-pay | Admitting: Pediatrics

## 2020-07-24 DIAGNOSIS — F5002 Anorexia nervosa, binge eating/purging type: Secondary | ICD-10-CM | POA: Diagnosis not present

## 2020-07-25 ENCOUNTER — Other Ambulatory Visit: Payer: Self-pay | Admitting: Pediatrics

## 2020-07-25 DIAGNOSIS — E538 Deficiency of other specified B group vitamins: Secondary | ICD-10-CM

## 2020-07-30 ENCOUNTER — Other Ambulatory Visit: Payer: Self-pay | Admitting: Family

## 2020-07-30 ENCOUNTER — Telehealth: Payer: Self-pay

## 2020-07-30 DIAGNOSIS — K219 Gastro-esophageal reflux disease without esophagitis: Secondary | ICD-10-CM

## 2020-07-30 MED ORDER — OMEPRAZOLE 20 MG PO CPDR: DELAYED_RELEASE_CAPSULE | ORAL | 0 refills | Status: DC

## 2020-07-30 NOTE — Telephone Encounter (Signed)
Pt asks for refill of omeprazole (PRILOSEC) 20 MG capsule. Routing to provider.

## 2020-07-31 NOTE — Telephone Encounter (Addendum)
Med refilled.

## 2020-08-02 ENCOUNTER — Encounter: Payer: Self-pay | Admitting: Pediatrics

## 2020-08-02 ENCOUNTER — Other Ambulatory Visit: Payer: Self-pay

## 2020-08-02 ENCOUNTER — Ambulatory Visit (INDEPENDENT_AMBULATORY_CARE_PROVIDER_SITE_OTHER): Payer: BC Managed Care – PPO | Admitting: Pediatrics

## 2020-08-02 VITALS — BP 120/83 | HR 96 | Ht 63.39 in | Wt 307.8 lb

## 2020-08-02 DIAGNOSIS — N946 Dysmenorrhea, unspecified: Secondary | ICD-10-CM

## 2020-08-02 DIAGNOSIS — E538 Deficiency of other specified B group vitamins: Secondary | ICD-10-CM

## 2020-08-02 DIAGNOSIS — F902 Attention-deficit hyperactivity disorder, combined type: Secondary | ICD-10-CM

## 2020-08-02 DIAGNOSIS — F4010 Social phobia, unspecified: Secondary | ICD-10-CM | POA: Diagnosis not present

## 2020-08-02 DIAGNOSIS — I10 Essential (primary) hypertension: Secondary | ICD-10-CM

## 2020-08-02 DIAGNOSIS — F4323 Adjustment disorder with mixed anxiety and depressed mood: Secondary | ICD-10-CM

## 2020-08-02 MED ORDER — DULOXETINE HCL 60 MG PO CPEP: 60.0000 mg | ORAL_CAPSULE | Freq: Every day | ORAL | 3 refills | Status: DC

## 2020-08-02 MED ORDER — METHYLPHENIDATE HCL ER (OSM) 54 MG PO TBCR: 54.0000 mg | EXTENDED_RELEASE_TABLET | Freq: Every day | ORAL | 0 refills | Status: DC

## 2020-08-02 NOTE — Patient Instructions (Addendum)
Increase duloxetine to 60 mg daily  Stop venlafaxine after 1 week Put concerta with toothbrush Bring b12 to your next appointments

## 2020-08-02 NOTE — Progress Notes (Signed)
History was provided by the patient.  Emily [name] is a 20 y.o. female who is here for anxiety, depression, HTN, urinary issues.  Geoffry Paradise, MD   HPI:  Pt reports that she is still "barely holding it together." Mom thinks she is bipolar. She said that she does struggle with paranoia about what others think of her which can make it much more difficult to be in public. Unfortunately her mom continues to be quite demeaning most of the time and Naveena dreads when it is time for mom to come home from work. Identifies her grandparents as a good source of support- she has been having to help them more recently as her gma fell and gpa is getting dementia. Could stay with them some.   Sometimes she can get it together, sometimes she can only do the bare minimum. Not taking her concerta regularly because she can't remember. Never finished taper off effexor as she wasn't sure what to do.   Made all her classes online despite others suggesting this as a bad idea. Worried about having to go out. Somewhat motivated to try a hybrid course.   Has not remembered to get her B12. Still having a lot of fatigue.   Has not been to urology though those symptoms have improved some.   Stopped ocp d/t BTB and has not restarted. Not sexually active.   PHQ-SADS Last 3 Score only 08/02/2020 04/25/2019 03/04/2018  PHQ-15 Score 12 8 -  Total GAD-7 Score 6 3 -  PHQ-9 Total Score 13 7 8      No LMP recorded. (Menstrual status: Oral contraceptives).    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   Hypertension 03/25/2017   Midline thoracic back pain 09/27/2015   Acne vulgaris 09/27/2015   Anorexia nervosa, binge-eating purging type 04/11/2015   Dysmenorrhea 02/27/2015   Sleep disturbance 11/18/2014   ADHD (attention deficit hyperactivity disorder) 08/24/2014   Adjustment disorder with mixed anxiety and depressed mood 08/23/2014    Current Outpatient Medications on File Prior to Visit  Medication Sig Dispense Refill   albuterol (VENTOLIN HFA) 108 (90 Base) MCG/ACT inhaler INHALE 2 PUFFS INTO THE LUNGS EVERY 4 HOURS AS NEEDED FOR WHEEZE OR FOR SHORTNESS OF BREATH 18 each 1   clindamycin (CLEOCIN T) 1 % lotion      cyanocobalamin (,VITAMIN B-12,) 1000 MCG/ML injection INJECT 1 ML (1,000 MCG TOTAL) INTO THE MUSCLE 3 (THREE) TIMES A WEEK FOR 12 DOSES. 12 mL 0   ferrous fumarate (HEMOCYTE - 106 MG FE) 325 (106 Fe) MG TABS tablet Take 1 tablet every other day with vitamin C 45 tablet 1   lisinopril (PRINIVIL) 10 MG tablet Take 1 tablet (10 mg total) by mouth daily. 30 tablet 3   naproxen (NAPROSYN) 500 MG tablet TAKE 1 TABLET BY MOUTH TWICE A DAY AS NEEDED 60 tablet 1   omeprazole (PRILOSEC) 20 MG capsule TAKE 1 CAPSULE BY MOUTH EVERY DAY 90 capsule 0   tretinoin (RETIN-A) 0.025 %  cream Apply topically at bedtime. 45 g 3   [DISCONTINUED] pantoprazole (PROTONIX) 40 MG tablet TAKE 1 TABLET BY MOUTH EVERY DAY 90 tablet 1   No current facility-administered medications on file prior to visit.    Allergies  Allergen Reactions   Dust Mite Extract Other (See Comments)    Sneezing, coughing, runny nose Sneezing, coughing, runny nose     Physical Exam:    Vitals:   08/02/20 1537  BP: 120/83  Pulse: 96  Weight: (!) 307 lb 12.8 oz (139.6 kg)  Height: 5' 3.39" (1.61 m)    Growth percentile SmartLinks can only be used for patients less than 16 years old.  Physical  Exam Vitals and nursing note reviewed.  Constitutional:      General: She is not in acute distress.    Appearance: She is well-developed.  Neck:     Thyroid: No thyromegaly.  Cardiovascular:     Rate and Rhythm: Normal rate and regular rhythm.     Heart sounds: No murmur heard. Pulmonary:     Breath sounds: Normal breath sounds.  Abdominal:     Palpations: Abdomen is soft. There is no mass.     Tenderness: There is no abdominal tenderness. There is no guarding.  Musculoskeletal:     Right lower leg: No edema.     Left lower leg: No edema.  Lymphadenopathy:     Cervical: No cervical adenopathy.  Skin:    General: Skin is warm.     Findings: No rash.  Neurological:     Mental Status: She is alert.     Comments: No tremor  Psychiatric:        Attention and Perception: Attention normal.        Mood and Affect: Affect normal. Mood is anxious.        Speech: Speech normal.        Behavior: Behavior normal.        Thought Content: Thought content normal.    Assessment/Plan: 1. Attention deficit hyperactivity disorder (ADHD), combined type Restart concerta daily and take when she brushes her teeth. She felt like she could remember this.   2. Adjustment disorder with mixed anxiety and depressed mood Increase duloxetine to 60 mg daily and stop venlafaxine after 1 week.  - DULoxetine (CYMBALTA) 60 MG capsule; Take 1 capsule (60 mg total) by mouth daily.  Dispense: 30 capsule; Refill: 3  3. Social anxiety disorder Long conversation about strategies and ideas for getting out of her current environment more. She is contemplative/preparation stage of staying with her grandparents 3-4 days a week. She plans to go home and change at least one class to hybrid. Was not ready to commit to a fully in person class.  4. B12 deficiency Has not come for injections. Scheduled weekly for the next 3 weeks as her b12 was significantly low. Likely r/t PPI use so may need ongoing injectable.   5.  Dysmenorrhea Stopped OCP for now- will monitor.   6. Primary hypertension BP improved.   Return in 3 weeks   Alfonso Ramus, FNP  Level of Service: This visit lasted in excess of 40 minutes. More than 50% of the visit was devoted to counseling.

## 2020-08-06 ENCOUNTER — Ambulatory Visit (INDEPENDENT_AMBULATORY_CARE_PROVIDER_SITE_OTHER): Payer: BC Managed Care – PPO

## 2020-08-06 ENCOUNTER — Other Ambulatory Visit: Payer: Self-pay

## 2020-08-06 DIAGNOSIS — E538 Deficiency of other specified B group vitamins: Secondary | ICD-10-CM | POA: Diagnosis not present

## 2020-08-06 MED ORDER — CYANOCOBALAMIN 1000 MCG/ML IJ SOLN
1000.0000 ug | Freq: Once | INTRAMUSCULAR | Status: AC
  Administered 2020-08-06: 1000 ug via INTRAMUSCULAR

## 2020-08-06 NOTE — Progress Notes (Signed)
Pt here today for B12 injection. Pt supplied B12 injection. Administered in right deltoid. Tolerated well. Next injection scheduled.

## 2020-08-07 DIAGNOSIS — F5002 Anorexia nervosa, binge eating/purging type: Secondary | ICD-10-CM | POA: Diagnosis not present

## 2020-08-14 DIAGNOSIS — F5002 Anorexia nervosa, binge eating/purging type: Secondary | ICD-10-CM | POA: Diagnosis not present

## 2020-08-16 ENCOUNTER — Ambulatory Visit: Payer: BC Managed Care – PPO

## 2020-08-18 ENCOUNTER — Other Ambulatory Visit: Payer: Self-pay | Admitting: Pediatrics

## 2020-08-18 DIAGNOSIS — I10 Essential (primary) hypertension: Secondary | ICD-10-CM

## 2020-08-21 ENCOUNTER — Other Ambulatory Visit: Payer: Self-pay | Admitting: Family

## 2020-08-21 DIAGNOSIS — E538 Deficiency of other specified B group vitamins: Secondary | ICD-10-CM

## 2020-08-23 ENCOUNTER — Ambulatory Visit: Payer: BC Managed Care – PPO | Admitting: Pediatrics

## 2020-08-27 ENCOUNTER — Other Ambulatory Visit: Payer: Self-pay | Admitting: Pediatrics

## 2020-08-27 DIAGNOSIS — F4323 Adjustment disorder with mixed anxiety and depressed mood: Secondary | ICD-10-CM

## 2020-08-28 DIAGNOSIS — F5002 Anorexia nervosa, binge eating/purging type: Secondary | ICD-10-CM | POA: Diagnosis not present

## 2020-09-04 DIAGNOSIS — F5002 Anorexia nervosa, binge eating/purging type: Secondary | ICD-10-CM | POA: Diagnosis not present

## 2020-09-11 DIAGNOSIS — F5002 Anorexia nervosa, binge eating/purging type: Secondary | ICD-10-CM | POA: Diagnosis not present

## 2020-09-19 DIAGNOSIS — F5002 Anorexia nervosa, binge eating/purging type: Secondary | ICD-10-CM | POA: Diagnosis not present

## 2020-09-23 ENCOUNTER — Other Ambulatory Visit: Payer: Self-pay | Admitting: Pediatrics

## 2020-09-23 DIAGNOSIS — E538 Deficiency of other specified B group vitamins: Secondary | ICD-10-CM

## 2020-09-26 DIAGNOSIS — F5002 Anorexia nervosa, binge eating/purging type: Secondary | ICD-10-CM | POA: Diagnosis not present

## 2020-09-27 ENCOUNTER — Other Ambulatory Visit: Payer: Self-pay | Admitting: Pediatrics

## 2020-10-03 DIAGNOSIS — F5002 Anorexia nervosa, binge eating/purging type: Secondary | ICD-10-CM | POA: Diagnosis not present

## 2020-10-10 DIAGNOSIS — F5002 Anorexia nervosa, binge eating/purging type: Secondary | ICD-10-CM | POA: Diagnosis not present

## 2020-10-16 DIAGNOSIS — F5002 Anorexia nervosa, binge eating/purging type: Secondary | ICD-10-CM | POA: Diagnosis not present

## 2020-10-17 DIAGNOSIS — F5002 Anorexia nervosa, binge eating/purging type: Secondary | ICD-10-CM | POA: Diagnosis not present

## 2020-10-24 ENCOUNTER — Other Ambulatory Visit: Payer: Self-pay | Admitting: Family

## 2020-10-24 DIAGNOSIS — E538 Deficiency of other specified B group vitamins: Secondary | ICD-10-CM

## 2020-10-24 DIAGNOSIS — F5002 Anorexia nervosa, binge eating/purging type: Secondary | ICD-10-CM | POA: Diagnosis not present

## 2020-11-07 DIAGNOSIS — F5002 Anorexia nervosa, binge eating/purging type: Secondary | ICD-10-CM | POA: Diagnosis not present

## 2020-11-21 DIAGNOSIS — F5002 Anorexia nervosa, binge eating/purging type: Secondary | ICD-10-CM | POA: Diagnosis not present

## 2020-11-26 ENCOUNTER — Other Ambulatory Visit: Payer: Self-pay | Admitting: Pediatrics

## 2020-11-26 DIAGNOSIS — F4323 Adjustment disorder with mixed anxiety and depressed mood: Secondary | ICD-10-CM

## 2020-11-26 DIAGNOSIS — E611 Iron deficiency: Secondary | ICD-10-CM

## 2020-11-27 ENCOUNTER — Ambulatory Visit (INDEPENDENT_AMBULATORY_CARE_PROVIDER_SITE_OTHER): Payer: BC Managed Care – PPO | Admitting: Pediatrics

## 2020-11-27 ENCOUNTER — Other Ambulatory Visit: Payer: Self-pay

## 2020-11-27 VITALS — BP 142/84 | HR 108 | Ht 63.39 in | Wt 313.4 lb

## 2020-11-27 DIAGNOSIS — R5382 Chronic fatigue, unspecified: Secondary | ICD-10-CM | POA: Diagnosis not present

## 2020-11-27 DIAGNOSIS — F902 Attention-deficit hyperactivity disorder, combined type: Secondary | ICD-10-CM | POA: Diagnosis not present

## 2020-11-27 DIAGNOSIS — E538 Deficiency of other specified B group vitamins: Secondary | ICD-10-CM | POA: Diagnosis not present

## 2020-11-27 DIAGNOSIS — F4010 Social phobia, unspecified: Secondary | ICD-10-CM | POA: Diagnosis not present

## 2020-11-27 DIAGNOSIS — I10 Essential (primary) hypertension: Secondary | ICD-10-CM

## 2020-11-27 DIAGNOSIS — M2550 Pain in unspecified joint: Secondary | ICD-10-CM | POA: Insufficient documentation

## 2020-11-27 DIAGNOSIS — F4323 Adjustment disorder with mixed anxiety and depressed mood: Secondary | ICD-10-CM | POA: Diagnosis not present

## 2020-11-27 DIAGNOSIS — H04129 Dry eye syndrome of unspecified lacrimal gland: Secondary | ICD-10-CM

## 2020-11-27 MED ORDER — METHYLPHENIDATE HCL ER (OSM) 54 MG PO TBCR: 54.0000 mg | EXTENDED_RELEASE_TABLET | Freq: Every day | ORAL | 0 refills | Status: DC

## 2020-11-27 NOTE — Progress Notes (Signed)
History was provided by the patient.  Emily [name] is a 20 y.o. female who is here for anxiety, depression, adhd, hypertension, b12 def.  Burnard Bunting, MD   HPI:  Pt reports she was embarrassed to come back but therapist encouraged her to get scheduled to check in about her meds. Has been having more issues with anxiety. Having issues with both hands where they will go numb. Having more intrusive thoughts, was worried she has some sort of psychosis. Experiences paranoia that people might be watching her or talking about her. Has good insight these things aren't real.   Wasn't able to move in with grandparents. She is doing better with school and finding more fulfillment. Struggling in bio lab. Started volunteering at the history [employer] twice a week.    Having a lot of joint stiffness and pain. Went to see sister and did a lot of walking and was very stiff and in pain. Has also been having muscle cramps.   Started back OCP. Just went through first pill pack and was having more significant depression during this time.   Still having dry eyes, chronic fatigue, joint pain and dry skin. She reports a history of a facial rash that was thought to be rosacea. Hx of proteinuria, hematuria and parapelvic cysts. Has been seen by urology, never by nephrology.   PHQ-SADS Last 3 Score only 11/27/2020 08/02/2020 04/25/2019  PHQ-15 Score $RemoveBefo'11 12 8  'jOYHzXeNJeh$ Total GAD-7 Score $RemoveBef'5 6 3  'PYpwZuiTHL$ PHQ Adolescent Score $RemoveBeforeDE'7 13 7      'zFYBzbIcekzrmSf$ No LMP recorded. (Menstrual status: Oral contraceptives).   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   Hypertension 03/25/2017   Midline thoracic back pain 09/27/2015   Acne vulgaris 09/27/2015   Anorexia nervosa, binge-eating purging type 04/11/2015   Dysmenorrhea 02/27/2015   Sleep disturbance 11/18/2014   ADHD (attention deficit hyperactivity disorder) 08/24/2014   Adjustment disorder with mixed anxiety and depressed mood 08/23/2014    Current Outpatient Medications on File Prior to Visit  Medication Sig Dispense Refill   albuterol (VENTOLIN HFA) 108 (90 Base) MCG/ACT inhaler INHALE 2 PUFFS INTO THE LUNGS EVERY 4 HOURS AS NEEDED FOR WHEEZE OR FOR SHORTNESS OF BREATH 18 each 1   clindamycin (CLEOCIN T) 1 % lotion      DULoxetine (CYMBALTA) 60 MG capsule TAKE 1 CAPSULE BY MOUTH EVERY DAY 90 capsule 0   ferrous fumarate (FERRETTS) 325 (106 Fe) MG TABS tablet TAKE 1 TABLET EVERY OTHER DAY WITH VITAMIN C 45 tablet 1   lisinopril (ZESTRIL) 10 MG tablet TAKE 1 TABLET BY MOUTH EVERY DAY 90 tablet 1   methylphenidate 54 MG PO CR tablet Take 1 tablet (54 mg total) by mouth daily with breakfast. 30 tablet 0   naproxen (NAPROSYN) 500 MG tablet TAKE 1 TABLET BY MOUTH TWICE A DAY AS NEEDED 60 tablet 1   omeprazole (PRILOSEC) 20 MG capsule TAKE 1 CAPSULE BY MOUTH EVERY DAY 90 capsule 0   tretinoin (RETIN-A) 0.025 % cream  Apply topically at bedtime. 45 g 3   ALTAVERA 0.15-30 MG-MCG tablet Take by mouth.     [DISCONTINUED] pantoprazole (PROTONIX) 40 MG tablet TAKE 1 TABLET BY MOUTH EVERY DAY 90 tablet 1   No current facility-administered medications on file prior to visit.    Allergies  Allergen Reactions   Dust Mite Extract Other (See Comments)    Sneezing, coughing, runny nose Sneezing, coughing, runny nose     Physical Exam:    Vitals:   11/27/20 0912 11/27/20 0916  BP: (!) 152/87 (!) 142/84  Pulse: (!) 103 (!) 108  Weight: (!) 313 lb 6.4 oz (142.2  kg)   Height: 5' 3.39" (1.61 m)     Growth percentile SmartLinks can only be used for patients less than 59 years old.  Physical Exam Vitals and nursing note reviewed.  Constitutional:      General: She is not in acute distress.    Appearance: She is well-developed. She is obese.  Neck:     Thyroid: No thyromegaly.  Cardiovascular:     Rate and Rhythm: Regular rhythm. Tachycardia present.     Pulses: Normal pulses.     Heart sounds: No murmur heard. Pulmonary:     Breath sounds: Normal breath sounds.  Musculoskeletal:     Right lower leg: No edema.     Left lower leg: No edema.  Lymphadenopathy:     Cervical: No cervical adenopathy.  Skin:    General: Skin is warm and dry.     Capillary Refill: Capillary refill takes less than 2 seconds.     Findings: No rash.  Neurological:     General: No focal deficit present.     Mental Status: She is alert.     Comments: No tremor  Psychiatric:        Mood and Affect: Mood normal.    Assessment/Plan: 1. Social anxiety disorder Having ongoing difficulties with anxiety. I suspect she is having a lot of intrustive thoughts. I worry less about a true psychosis. GAD-7 is very stable.   2. B12 deficiency Repeat today. Discussed that physical symptoms could be r/t B12.  - B12 and Folate Panel  3. Attention deficit hyperactivity disorder (ADHD), combined type Restart concerta.  - methylphenidate 54 MG PO CR tablet; Take 1 tablet (54 mg total) by mouth daily with breakfast.  Dispense: 30 tablet; Refill: 0  4. Adjustment disorder with mixed anxiety and depressed mood Continue cymbalta 60 mg for now. Will get labs and then consider med changes.   5. Primary hypertension Needs to restart lisinopril.   6. Dry eye With significant chronic dry eye, fatigue and pain, needs additional workup and possible referral to rheum to assess further.  - Sjogrens syndrome-A extractable nuclear antibody - Sjogrens syndrome-B extractable nuclear  antibody  7. Chronic fatigue As above, may also be r/t b12 def  - Comprehensive metabolic panel - CBC with Differential/Platelet - Sjogrens syndrome-A extractable nuclear antibody - Sjogrens syndrome-B extractable nuclear antibody - TSH + free T4  8. Arthralgia, unspecified joint As above.  - Sed Rate (ESR) - C-reactive protein - Rheumatoid Factor - Sjogrens syndrome-A extractable nuclear antibody - Sjogrens syndrome-B extractable nuclear antibody  Return in 2 weeks for lab review and med adjustments   Jonathon Resides, FNP  Level of Service: This visit lasted in excess of 40 minutes. More than 50% of the visit was devoted to counseling about autoimmune symptoms, anxiety, depression, b12 deficiency.

## 2020-11-27 NOTE — Patient Instructions (Signed)
Labs today  Restart lisinopril and concerta  We will make adjustments to meds after labs  See you in 2 weeks!

## 2020-11-28 ENCOUNTER — Other Ambulatory Visit: Payer: Self-pay | Admitting: Pediatrics

## 2020-11-28 DIAGNOSIS — R5382 Chronic fatigue, unspecified: Secondary | ICD-10-CM

## 2020-11-28 DIAGNOSIS — M2550 Pain in unspecified joint: Secondary | ICD-10-CM

## 2020-11-28 DIAGNOSIS — R7982 Elevated C-reactive protein (CRP): Secondary | ICD-10-CM

## 2020-11-28 DIAGNOSIS — H04129 Dry eye syndrome of unspecified lacrimal gland: Secondary | ICD-10-CM

## 2020-11-28 DIAGNOSIS — R700 Elevated erythrocyte sedimentation rate: Secondary | ICD-10-CM

## 2020-11-28 DIAGNOSIS — F5002 Anorexia nervosa, binge eating/purging type: Secondary | ICD-10-CM | POA: Diagnosis not present

## 2020-11-28 LAB — COMPREHENSIVE METABOLIC PANEL
AG Ratio: 1.5 (calc) (ref 1.0–2.5)
ALT: 11 U/L (ref 6–29)
AST: 13 U/L (ref 10–30)
Albumin: 4.1 g/dL (ref 3.6–5.1)
Alkaline phosphatase (APISO): 40 U/L (ref 31–125)
BUN: 15 mg/dL (ref 7–25)
CO2: 22 mmol/L (ref 20–32)
Calcium: 9.3 mg/dL (ref 8.6–10.2)
Chloride: 105 mmol/L (ref 98–110)
Creat: 0.69 mg/dL (ref 0.50–0.96)
Globulin: 2.7 g/dL (calc) (ref 1.9–3.7)
Glucose, Bld: 118 mg/dL — ABNORMAL HIGH (ref 65–99)
Potassium: 4.4 mmol/L (ref 3.5–5.3)
Sodium: 138 mmol/L (ref 135–146)
Total Bilirubin: 0.3 mg/dL (ref 0.2–1.2)
Total Protein: 6.8 g/dL (ref 6.1–8.1)

## 2020-11-28 LAB — CBC WITH DIFFERENTIAL/PLATELET
Absolute Monocytes: 905 cells/uL (ref 200–950)
Basophils Absolute: 58 cells/uL (ref 0–200)
Basophils Relative: 0.5 %
Eosinophils Absolute: 139 cells/uL (ref 15–500)
Eosinophils Relative: 1.2 %
HCT: 37.4 % (ref 35.0–45.0)
Hemoglobin: 12.1 g/dL (ref 11.7–15.5)
Lymphs Abs: 3619 cells/uL (ref 850–3900)
MCH: 28.6 pg (ref 27.0–33.0)
MCHC: 32.4 g/dL (ref 32.0–36.0)
MCV: 88.4 fL (ref 80.0–100.0)
MPV: 10.1 fL (ref 7.5–12.5)
Monocytes Relative: 7.8 %
Neutro Abs: 6879 cells/uL (ref 1500–7800)
Neutrophils Relative %: 59.3 %
Platelets: 493 10*3/uL — ABNORMAL HIGH (ref 140–400)
RBC: 4.23 10*6/uL (ref 3.80–5.10)
RDW: 14.1 % (ref 11.0–15.0)
Total Lymphocyte: 31.2 %
WBC: 11.6 10*3/uL — ABNORMAL HIGH (ref 3.8–10.8)

## 2020-11-28 LAB — C-REACTIVE PROTEIN: CRP: 35.5 mg/L — ABNORMAL HIGH (ref <8.0)

## 2020-11-28 LAB — SJOGRENS SYNDROME-A EXTRACTABLE NUCLEAR ANTIBODY: SSA (Ro) (ENA) Antibody, IgG: <1 AI

## 2020-11-28 LAB — B12 AND FOLATE PANEL
Folate: 8.5 ng/mL
Vitamin B-12: 204 pg/mL (ref 200–1100)

## 2020-11-28 LAB — SJOGRENS SYNDROME-B EXTRACTABLE NUCLEAR ANTIBODY: SSB (La) (ENA) Antibody, IgG: <1 AI

## 2020-11-28 LAB — RHEUMATOID FACTOR: Rheumatoid fact SerPl-aCnc: <14 IU/mL (ref <14)

## 2020-11-28 LAB — TSH+FREE T4: TSH W/REFLEX TO FT4: 3.33 mIU/L

## 2020-11-28 LAB — SEDIMENTATION RATE: Sed Rate: 43 mm/h — ABNORMAL HIGH (ref 0–20)

## 2020-11-29 ENCOUNTER — Other Ambulatory Visit: Payer: Self-pay | Admitting: Pediatrics

## 2020-11-29 DIAGNOSIS — R7982 Elevated C-reactive protein (CRP): Secondary | ICD-10-CM

## 2020-11-29 DIAGNOSIS — R700 Elevated erythrocyte sedimentation rate: Secondary | ICD-10-CM

## 2020-11-30 DIAGNOSIS — R7982 Elevated C-reactive protein (CRP): Secondary | ICD-10-CM | POA: Diagnosis not present

## 2020-11-30 DIAGNOSIS — R700 Elevated erythrocyte sedimentation rate: Secondary | ICD-10-CM | POA: Diagnosis not present

## 2020-12-11 ENCOUNTER — Encounter: Payer: Self-pay | Admitting: Pediatrics

## 2020-12-11 ENCOUNTER — Ambulatory Visit (INDEPENDENT_AMBULATORY_CARE_PROVIDER_SITE_OTHER): Payer: BC Managed Care – PPO | Admitting: Pediatrics

## 2020-12-11 ENCOUNTER — Other Ambulatory Visit: Payer: Self-pay

## 2020-12-11 VITALS — BP 116/55 | HR 98 | Ht 63.39 in | Wt 311.2 lb

## 2020-12-11 DIAGNOSIS — M2550 Pain in unspecified joint: Secondary | ICD-10-CM

## 2020-12-11 DIAGNOSIS — F902 Attention-deficit hyperactivity disorder, combined type: Secondary | ICD-10-CM

## 2020-12-11 DIAGNOSIS — R7309 Other abnormal glucose: Secondary | ICD-10-CM | POA: Diagnosis not present

## 2020-12-11 DIAGNOSIS — R5382 Chronic fatigue, unspecified: Secondary | ICD-10-CM

## 2020-12-11 DIAGNOSIS — G479 Sleep disorder, unspecified: Secondary | ICD-10-CM

## 2020-12-11 DIAGNOSIS — F4323 Adjustment disorder with mixed anxiety and depressed mood: Secondary | ICD-10-CM

## 2020-12-11 DIAGNOSIS — I10 Essential (primary) hypertension: Secondary | ICD-10-CM

## 2020-12-11 DIAGNOSIS — Z1389 Encounter for screening for other disorder: Secondary | ICD-10-CM

## 2020-12-11 DIAGNOSIS — R7982 Elevated C-reactive protein (CRP): Secondary | ICD-10-CM | POA: Diagnosis not present

## 2020-12-11 DIAGNOSIS — R739 Hyperglycemia, unspecified: Secondary | ICD-10-CM | POA: Diagnosis not present

## 2020-12-11 DIAGNOSIS — R700 Elevated erythrocyte sedimentation rate: Secondary | ICD-10-CM | POA: Diagnosis not present

## 2020-12-11 DIAGNOSIS — Z6841 Body Mass Index (BMI) 40.0 and over, adult: Secondary | ICD-10-CM

## 2020-12-11 DIAGNOSIS — E782 Mixed hyperlipidemia: Secondary | ICD-10-CM

## 2020-12-11 DIAGNOSIS — E538 Deficiency of other specified B group vitamins: Secondary | ICD-10-CM

## 2020-12-11 DIAGNOSIS — E785 Hyperlipidemia, unspecified: Secondary | ICD-10-CM | POA: Diagnosis not present

## 2020-12-11 LAB — POCT URINALYSIS DIPSTICK
Bilirubin, UA: NEGATIVE
Blood, UA: POSITIVE
Glucose, UA: NEGATIVE
Ketones, UA: NEGATIVE
Leukocytes, UA: NEGATIVE
Nitrite, UA: NEGATIVE
Protein, UA: POSITIVE — AB
Spec Grav, UA: 1.015 (ref 1.010–1.025)
Urobilinogen, UA: 1 E.U./dL
pH, UA: 6.5 (ref 5.0–8.0)

## 2020-12-11 LAB — ANA,IFA RA DIAG PNL W/RFLX TIT/PATN
Anti Nuclear Antibody (ANA): NEGATIVE
Cyclic Citrullin Peptide Ab: <16 UNITS
Rheumatoid fact SerPl-aCnc: <14 IU/mL (ref <14)

## 2020-12-11 LAB — ANTI-DNA ANTIBODY, DOUBLE-STRANDED: ds DNA Ab: <1 IU/mL

## 2020-12-11 LAB — PROCALCITONIN: Procalcitonin: <0.1 ng/mL (ref <0.10)

## 2020-12-11 MED ORDER — CYANOCOBALAMIN 1000 MCG/ML IJ SOLN: 1000.0000 ug | Freq: Once | INTRAMUSCULAR | Status: DC

## 2020-12-11 MED ORDER — CYANOCOBALAMIN 1000 MCG/ML IJ SOLN
1000.0000 ug | Freq: Once | INTRAMUSCULAR | Status: AC
  Administered 2020-12-11: 1000 ug via INTRAMUSCULAR

## 2020-12-11 NOTE — Progress Notes (Signed)
History was provided by the patient.  Emily [name] is a 20 y.o. female who is here for lab follow-up.  Burnard Bunting, MD   HPI:  Pt reports things have been "up and down." Feels like she was doing too much looking on the internet about conditions and symptoms. Had a really bad cold that started 2 days after our last appointment. Had subjective fever but never checked. Still having ongoing MSK pain but feels like it is unchanged from prior.   Is still having menstrual bleeding that started about 2 weeks ago. Not having bleeding every time she wipes. Does have some cramping but feels like this is not out of the ordinary.   Brought B12 today.   Restarted BP meds.   No other s/sx of infection.   Mom has super high cholesterol- Annamarie is wondering about this today related to hers.   Seeing ENT on Thursday in regards to her nosebleeds.   No LMP recorded. (Menstrual status: Oral contraceptives).   Patient Active Problem List   Diagnosis Date Noted   Arthralgia 11/27/2020   Social anxiety disorder 08/02/2020   B12 deficiency 06/05/2020   Dry eye 05/25/2020   Vitamin D deficiency 05/25/2020   Epistaxis, recurrent 01/05/2020   Ankle weakness 12/01/2019   Mixed hyperlipidemia 12/01/2019   BMI 50.0-59.9, adult ([place]) 05/12/2019   Chronic fatigue 05/12/2019   Acute left-sided low back pain without sciatica 12/09/2017   Chronic left-sided low back pain without sciatica 12/02/2017   Interstitial cystitis (chronic) with hematuria 09/81/1914   Partially duplicated ureter 78/29/5621   Hypertension 03/25/2017   Midline thoracic back pain 09/27/2015   Acne vulgaris 09/27/2015   Anorexia nervosa, binge-eating purging type 04/11/2015   Dysmenorrhea 02/27/2015   Sleep disturbance 11/18/2014   ADHD (attention deficit hyperactivity disorder) 08/24/2014   Adjustment disorder with mixed anxiety and depressed mood 08/23/2014    Current Outpatient Medications on File Prior to Visit   Medication Sig Dispense Refill   albuterol (VENTOLIN HFA) 108 (90 Base) MCG/ACT inhaler INHALE 2 PUFFS INTO THE LUNGS EVERY 4 HOURS AS NEEDED FOR WHEEZE OR FOR SHORTNESS OF BREATH 18 each 1   ALTAVERA 0.15-30 MG-MCG tablet Take by mouth.     clindamycin (CLEOCIN T) 1 % lotion      DULoxetine (CYMBALTA) 60 MG capsule TAKE 1 CAPSULE BY MOUTH EVERY DAY 90 capsule 0   ferrous fumarate (FERRETTS) 325 (106 Fe) MG TABS tablet TAKE 1 TABLET EVERY OTHER DAY WITH VITAMIN C 45 tablet 1   lisinopril (ZESTRIL) 10 MG tablet TAKE 1 TABLET BY MOUTH EVERY DAY 90 tablet 1   methylphenidate 54 MG PO CR tablet Take 1 tablet (54 mg total) by mouth daily with breakfast. 30 tablet 0   naproxen (NAPROSYN) 500 MG tablet TAKE 1 TABLET BY MOUTH TWICE A DAY AS NEEDED 60 tablet 1   omeprazole (PRILOSEC) 20 MG capsule TAKE 1 CAPSULE BY MOUTH EVERY DAY 90 capsule 0   tretinoin (RETIN-A) 0.025 % cream Apply topically at bedtime. 45 g 3   [DISCONTINUED] pantoprazole (PROTONIX) 40 MG tablet TAKE 1 TABLET BY MOUTH EVERY DAY 90 tablet 1   No current facility-administered medications on file prior to visit.    Allergies  Allergen Reactions   Dust Mite Extract Other (See Comments)    Sneezing, coughing, runny nose Sneezing, coughing, runny nose     Physical Exam:    Vitals:   12/11/20 1401  BP: (!) 116/55  Pulse: 98  Weight: Marland Kitchen)  311 lb 3.2 oz (141.2 kg)  Height: 5' 3.39" (1.61 m)    Growth percentile SmartLinks can only be used for patients less than 60 years old.  Physical Exam Vitals and nursing note reviewed.  Constitutional:      General: She is not in acute distress.    Appearance: She is well-developed.  Neck:     Thyroid: No thyromegaly.  Cardiovascular:     Rate and Rhythm: Normal rate and regular rhythm.     Heart sounds: No murmur heard. Pulmonary:     Breath sounds: Normal breath sounds.  Musculoskeletal:     Right lower leg: No edema.     Left lower leg: No edema.  Lymphadenopathy:      Cervical: No cervical adenopathy.  Skin:    General: Skin is warm.     Capillary Refill: Capillary refill takes less than 2 seconds.     Findings: No rash.  Neurological:     Mental Status: She is alert.     Comments: No tremor  Psychiatric:        Mood and Affect: Mood and affect normal.    Assessment/Plan: 1. Elevated C-reactive protein (CRP) Recheck today. May have been related to the illness she developed a few days after our visit. Will get procalcitonin to help delineate if any further investigation for infection should be conducted. Afebrile and without other s/sx of infection.  - C-reactive protein - Procalcitonin  2. Elevated sed rate Will repeat today. Rheum appt on 1/5.  - Sed Rate (ESR)  3. Arthralgia, unspecified joint As above.   4. Chronic fatigue Will repeat TFTs, t3 uptake was slightly low in the past.  - Thyroid Panel With TSH  5. Primary hypertension Improved with restart of lisinopril.   6. Adjustment disorder with mixed anxiety and depressed mood Continue cymbalta 60 mg daily   7. Attention deficit hyperactivity disorder (ADHD), combined type Restart concerta. Discussed possible shortage at the pharmacy.   8. Sleep disturbance Stable.   9. BMI 50.0-59.9, adult ([place]) A1C today with labs.  - Hemoglobin A1c  10. B12 deficiency B12 injections weekly for the next 4 weeks  - cyanocobalamin ((VITAMIN B-12)) injection 1,000 mcg  11. Mixed hyperlipidemia Will recheck at next visit fasting. Based on fam hx may have familial hyperlipidemia   12. Screening for genitourinary condition + blood and pro but still having some bleeding from menstrual cycle and has h/o interstitial cystitis.  - POCT urinalysis dipstick  Return in 4-6 weeks   Jonathon Resides, FNP

## 2020-12-12 DIAGNOSIS — F5002 Anorexia nervosa, binge eating/purging type: Secondary | ICD-10-CM | POA: Diagnosis not present

## 2020-12-14 LAB — THYROID PANEL WITH TSH
Free Thyroxine Index: 2.3 (ref 1.4–3.8)
T3 Uptake: 19 % — ABNORMAL LOW (ref 22–35)
T4, Total: 12 ug/dL — ABNORMAL HIGH (ref 5.1–11.9)
TSH: 2.64 mIU/L

## 2020-12-14 LAB — C-REACTIVE PROTEIN: CRP: 24.8 mg/L — ABNORMAL HIGH (ref <8.0)

## 2020-12-14 LAB — SEDIMENTATION RATE: Sed Rate: 41 mm/h — ABNORMAL HIGH (ref 0–20)

## 2020-12-14 LAB — PROCALCITONIN: Procalcitonin: <0.1 ng/mL (ref <0.10)

## 2020-12-14 LAB — HEMOGLOBIN A1C W/OUT EAG: Hgb A1c MFr Bld: 5.7 % of total Hgb — ABNORMAL HIGH (ref <5.7)

## 2020-12-19 DIAGNOSIS — F5002 Anorexia nervosa, binge eating/purging type: Secondary | ICD-10-CM | POA: Diagnosis not present

## 2020-12-20 ENCOUNTER — Ambulatory Visit: Payer: BC Managed Care – PPO

## 2020-12-26 DIAGNOSIS — F5002 Anorexia nervosa, binge eating/purging type: Secondary | ICD-10-CM | POA: Diagnosis not present

## 2020-12-27 ENCOUNTER — Ambulatory Visit: Payer: BC Managed Care – PPO | Admitting: Pediatrics

## 2021-01-01 ENCOUNTER — Other Ambulatory Visit: Payer: Self-pay | Admitting: Allergy & Immunology

## 2021-01-01 ENCOUNTER — Other Ambulatory Visit: Payer: Self-pay | Admitting: Pediatrics

## 2021-01-01 ENCOUNTER — Other Ambulatory Visit: Payer: Self-pay | Admitting: Family

## 2021-01-01 DIAGNOSIS — E538 Deficiency of other specified B group vitamins: Secondary | ICD-10-CM

## 2021-01-02 DIAGNOSIS — F5002 Anorexia nervosa, binge eating/purging type: Secondary | ICD-10-CM | POA: Diagnosis not present

## 2021-01-03 ENCOUNTER — Ambulatory Visit: Payer: BC Managed Care – PPO

## 2021-01-09 DIAGNOSIS — F5002 Anorexia nervosa, binge eating/purging type: Secondary | ICD-10-CM | POA: Diagnosis not present

## 2021-01-16 DIAGNOSIS — F5002 Anorexia nervosa, binge eating/purging type: Secondary | ICD-10-CM | POA: Diagnosis not present

## 2021-01-16 NOTE — Progress Notes (Signed)
Office Visit Note  Patient: Emily [name]             Date of Birth: 10/14/2000           MRN: [id number]             PCP: Burnard Bunting, MD Referring: Trude Mcburney, FNP Visit Date: 01/17/2021 Occupation: Student and Optician, dispensing  Subjective:  New Patient (Initial Visit) (Total body pain)   History of Present Illness: Emily [name] is a 21 y.o. female here for evaluation of fatigue, joint pains, dry eyes with elevated ESR and CRP.  She has a chronic history of multiple symptoms going back to at least about 7 years ago.  At that time she had eating disorder with some problems attributed to malnutrition and this condition but have persisted despite resolution of her eating disorder problems.  Mostly with a lot of fatigue, intermittent paresthesias and sensitivity, insomnia, skin rashes, and body pains especially in the back and upper arms.  She feels these have overall just progressed with time and now feels like muscle and joint pains are frequently limiting activities.  She does not usually notice any joint swelling warmth or erythema.  Worst affected areas are in the upper and lower back bilaterally.  She is done some work with physical therapy without a long persistent benefit to symptoms. She takes Cymbalta primarily for anxiety and depression symptoms.  She takes naproxen as needed which is also partially helpful for joint and muscle pains. She has taken Flexeril in the past for muscular pain and trazodone for insomnia but feels this medicine did not do well with her.  She had previous sleep study that was negative for significant sleep apnea. Skin rashes on the face have been described as probably rosacea she has flushing with stress and with alcohol.  She maintains chronic mild rashes on the arms sometimes eczema type symptoms.  She is frequently heat intolerance and feels hot with some skin flushing.  Denies discoloration in hands and feet such as pallor or  cyanosis. Dry eye symptoms are a major complaint with some worsening over time for which she was concerned of possible sjogren's syndrome but negative serology to date. She uses eye drops for this currently and has discussed possible going to restasis treatment with her eye doctor. She does not have as much problem with mouth dryness, although drinks water very frequently. She is also had chronic problems with interstitial cystitis apparently some structural abnormality with the ureters and decreased bladder volume.  Previous work-up including contrast CT study of the abdomen with some possible cystic changes but not thought to represent polycystic kidney disease.  Urinalysis has shown persistent proteinuria usually without other findings and renal function remains normal.  Labs reviewed 11/2020 ANA neg dsDNA, SSA, SSB neg RF neg CCP neg ESR 41, 43 CRP 24.8, 35.5 CBC WBC 11.6 Plts 493 CMP wnl TSH 2.64 UA +protein  05/2020 Ferritin 7 Vit D 21  Activities of Daily Living:  Patient reports morning stiffness for 5 minutes.   Patient Reports nocturnal pain.  Difficulty dressing/grooming: Denies Difficulty climbing stairs: Reports Difficulty getting out of chair: Reports Difficulty using hands for taps, buttons, cutlery, and/or writing: Denies  Review of Systems  Constitutional:  Positive for fatigue.  HENT:  Positive for mouth dryness.   Eyes:  Positive for dryness.  Respiratory:  Positive for shortness of breath.   Cardiovascular:  Negative for swelling in legs/feet.  Gastrointestinal:  Negative for  constipation.  Endocrine: Positive for heat intolerance, excessive thirst and increased urination.  Genitourinary:  Negative for difficulty urinating.  Musculoskeletal:  Positive for joint pain, gait problem, joint pain, muscle weakness, morning stiffness and muscle tenderness.  Skin:  Positive for rash and redness.  Allergic/Immunologic: Positive for susceptible to infections.   Neurological:  Positive for numbness and weakness.  Hematological:  Negative for bruising/bleeding tendency.  Psychiatric/Behavioral:  Positive for sleep disturbance.    PMFS History:  Patient Active Problem List   Diagnosis Date Noted   Acne rosacea 01/17/2021   Elevated C-reactive protein (CRP) 12/11/2020   Elevated sed rate 12/11/2020   Arthralgia 11/27/2020   Social anxiety disorder 08/02/2020   B12 deficiency 06/05/2020   Dry eye 05/25/2020   Vitamin D deficiency 05/25/2020   Epistaxis, recurrent 01/05/2020   Ankle weakness 12/01/2019   Mixed hyperlipidemia 12/01/2019   Paresthesia of skin 05/27/2019   BMI 50.0-59.9, adult ([place]) 05/12/2019   Chronic fatigue 05/12/2019   Chronic left-sided low back pain without sciatica 12/02/2017   Interstitial cystitis (chronic) with hematuria 48/25/0037   Partially duplicated ureter 04/88/8916   Hypertension 03/25/2017   Midline thoracic back pain 09/27/2015   Acne vulgaris 09/27/2015   Anorexia nervosa, binge-eating purging type 04/11/2015   Dysmenorrhea 02/27/2015   Sleep disturbance 11/18/2014   ADHD (attention deficit hyperactivity disorder) 08/24/2014   Adjustment disorder with mixed anxiety and depressed mood 08/23/2014    Past Medical History:  Diagnosis Date   ADHD (attention deficit hyperactivity disorder)    Anorexia    Anxiety    Asthma    B12 deficiency    Bulimia    Depression    Morbid obesity ([place])     Family History  Problem Relation Age of Onset   Anxiety disorder Mother    Drug abuse Father    Hyperlipidemia Maternal Grandmother    Hypertension Maternal Grandmother    Hyperlipidemia Maternal Grandfather    Hypertension Maternal Grandfather    Colon cancer Neg Hx    Pancreatic cancer Neg Hx    Esophageal cancer Neg Hx    Past Surgical History:  Procedure Laterality Date   ADENOIDECTOMY     BLADDER REPAIR     LIGAMENT REPAIR     TONSILLECTOMY     Social History   Social History Narrative   Not  on file   Immunization History  Administered Date(s) Administered   DTaP 12/05/1998, 02/01/1999, 04/15/1999, 12/31/1999, 12/20/2002   HPV 9-valent 01/01/2010, 04/04/2010, 08/07/2010   HPV Quadrivalent 01/01/2010, 04/04/2010, 08/07/2010   Hepatitis A, Adult 12/23/2004, 10/13/2005   Hepatitis A, Ped/Adol-2 Dose 12/23/2004, 10/13/2005   Hepatitis B, ped/adol Feb 03, 1998, 10/30/1998, 07/01/1999   HiB (PRP-OMP) 12/05/1998, 02/01/1999, 04/15/1999, 12/31/1999   HiB (PRP-T) 12/05/1998, 02/01/1999, 04/15/1999, 12/31/1999   IPV 12/05/1998, 02/01/1999, 04/15/1999, 12/20/2002   Influenza Split 11/10/2011, 11/12/2012, 11/09/2013, 10/14/2018   Influenza,inj,Quad PF,6+ Mos 10/14/2018   Influenza,inj,Quad PF,6-35 Mos 11/25/2016   Influenza,inj,quad, With Preservative 10/03/2014   Influenza-Unspecified 11/25/2016   MMR 09/30/1999, 12/20/2002   Meningococcal B, OMV 11/25/2016, 01/29/2017   Meningococcal Conjugate 01/01/2010, 10/03/2014   Meningococcal Mcv4o 01/01/2010, 10/03/2014   Moderna Sars-Covid-2 Vaccination 02/14/2019, 04/03/2019   Pneumococcal Polysaccharide-23 12/05/1998, 02/01/1999, 04/15/1999, 12/31/1999   Pneumococcal-Unspecified 12/05/1998, 02/01/1999, 04/15/1999, 12/31/1999   Td 05/21/2009, 07/28/2019   Tdap 05/21/2009   Varicella 09/30/1999, 12/23/2004     Objective: Vital Signs: BP 118/78 (BP Location: Right Arm, Patient Position: Sitting, Cuff Size: Normal)    Pulse (!) 114    Resp  15    Ht _0  (1.6 m)    Wt (!) 310 lb (140.6 kg)    BMI 54.91 kg/m    Physical Exam Constitutional:      Appearance: She is obese.  HENT:     Right Ear: External ear normal.     Left Ear: External ear normal.     Mouth/Throat:     Mouth: Mucous membranes are moist.     Pharynx: Oropharynx is clear.  Eyes:     Conjunctiva/sclera: Conjunctivae normal.  Cardiovascular:     Rate and Rhythm: Regular rhythm. Tachycardia present.  Pulmonary:     Effort: Pulmonary effort is normal.     Breath  sounds: Normal breath sounds.  Musculoskeletal:     Right lower leg: No edema.     Left lower leg: No edema.  Skin:    General: Skin is warm and dry.     Comments: Facial rosacea with papules and few telangiectasias Keratosis pilaris on bilateral arms Normal nailfold capillaroscopy  Neurological:     Mental Status: She is alert.  Psychiatric:        Mood and Affect: Mood normal.     Musculoskeletal Exam:  Neck full ROM no tenderness Shoulders full ROM tenderness over trapezius muscles and between scapulae Elbows full ROM no tenderness or swelling Wrists full ROM no tenderness or swelling Fingers full ROM no tenderness or swelling No paraspinal tenderness to palpation over lower back Hip normal internal and external rotation without pain, mild tenderness to lateral hip palpation Knees full ROM no tenderness or swelling Ankles full ROM no tenderness or swelling   Investigation: No additional findings.  Imaging: No results found.  Recent Labs: Lab Results  Component Value Date   WBC 11.6 (H) 11/27/2020   HGB 12.1 11/27/2020   PLT 493 (H) 11/27/2020   NA 138 11/27/2020   K 4.4 11/27/2020   CL 105 11/27/2020   CO2 22 11/27/2020   GLUCOSE 118 (H) 11/27/2020   BUN 15 11/27/2020   CREATININE 0.69 11/27/2020   BILITOT 0.3 11/27/2020   ALKPHOS 24 (L) 04/21/2016   AST 13 11/27/2020   ALT 11 11/27/2020   PROT 6.8 11/27/2020   ALBUMIN 3.8 04/21/2016   CALCIUM 9.3 11/27/2020    Speciality Comments: No specialty comments available.  Procedures:  No procedures performed Allergies: Dust mite extract   Assessment / Plan:     Visit Diagnoses: Elevated sed rate Elevated C-reactive protein (CRP) - Plan: Sedimentation rate, C-reactive protein, CK, Protein / creatinine ratio, urine, C3 and C4, IgG, IgA, IgM  Elevated inflammatory markers without any specific clinical signs or symptoms present on exam today.  We will repeat these for any interval progression.  Also checking  CK, immunoglobulins, serum complements for other evidence of inflammatory activity.  She has history of chronic proteinuria we will check protein creatinine ratio to quantify this today though no symptoms of nephrotic range proteinuria at this time.  Chronic fatigue  Persistent fatigue unclear cause she has had previous sleep study already with frequent disruptions and not responded well to sleep medication so far.  Lab work-up as above.  Arthralgia, unspecified joint   Working up as detailed above pain in multiple joints but worst is actually in muscular areas especially the back shoulders and upper arms on exam.  She describes some features for the myofascial pain or fibromyalgia syndrome but this would not explain the above laboratory abnormalities.  Plan to follow-up and discuss medication and  nonmedication symptom management.  Acne rosacea  Facial rash looks consistent with acne rosacea multiple papules very few telangiectasias.  This is not currently bothering her too much so not recommending starting new topical medication.  Dry eye  Chronic dry eye syndrome but has negative serology and without any salivary or lacrimal gland abnormality on exam or more severe oral symptoms I suspect not representing sjogren syndrome so do not think additional testing such as biopsy would be high yield.  She is already established with eye doctor for ongoing symptom management.  Orders: Orders Placed This Encounter  Procedures   Sedimentation rate   C-reactive protein   CK   Protein / creatinine ratio, urine   C3 and C4   IgG, IgA, IgM   No orders of the defined types were placed in this encounter.    Follow-Up Instructions: Return for New pt f/u.   Collier Salina, MD  Note - This record has been created using [employer].  Chart creation errors have been sought, but may not always  have been located. Such creation errors do not reflect on  the standard of medical care.

## 2021-01-17 ENCOUNTER — Other Ambulatory Visit: Payer: Self-pay

## 2021-01-17 ENCOUNTER — Encounter: Payer: Self-pay | Admitting: Internal Medicine

## 2021-01-17 ENCOUNTER — Ambulatory Visit (INDEPENDENT_AMBULATORY_CARE_PROVIDER_SITE_OTHER): Payer: BC Managed Care – PPO | Admitting: Internal Medicine

## 2021-01-17 VITALS — BP 118/78 | HR 114 | Resp 15 | Ht 63.0 in | Wt 310.0 lb

## 2021-01-17 DIAGNOSIS — R5382 Chronic fatigue, unspecified: Secondary | ICD-10-CM

## 2021-01-17 DIAGNOSIS — R7982 Elevated C-reactive protein (CRP): Secondary | ICD-10-CM | POA: Diagnosis not present

## 2021-01-17 DIAGNOSIS — H04129 Dry eye syndrome of unspecified lacrimal gland: Secondary | ICD-10-CM

## 2021-01-17 DIAGNOSIS — M2550 Pain in unspecified joint: Secondary | ICD-10-CM | POA: Diagnosis not present

## 2021-01-17 DIAGNOSIS — R700 Elevated erythrocyte sedimentation rate: Secondary | ICD-10-CM | POA: Diagnosis not present

## 2021-01-17 DIAGNOSIS — L719 Rosacea, unspecified: Secondary | ICD-10-CM | POA: Insufficient documentation

## 2021-01-18 LAB — SEDIMENTATION RATE: Sed Rate: 31 mm/h — ABNORMAL HIGH (ref 0–20)

## 2021-01-18 LAB — C3 AND C4
C3 Complement: 237 mg/dL — ABNORMAL HIGH (ref 83–193)
C4 Complement: 48 mg/dL (ref 15–57)

## 2021-01-18 LAB — IGG, IGA, IGM
IgG (Immunoglobin G), Serum: 1129 mg/dL (ref 600–1640)
IgM, Serum: 64 mg/dL (ref 50–300)
Immunoglobulin A: 162 mg/dL (ref 47–310)

## 2021-01-18 LAB — CK: Total CK: 62 U/L (ref 29–143)

## 2021-01-18 LAB — C-REACTIVE PROTEIN: CRP: 22.5 mg/L — ABNORMAL HIGH (ref <8.0)

## 2021-01-22 ENCOUNTER — Ambulatory Visit (INDEPENDENT_AMBULATORY_CARE_PROVIDER_SITE_OTHER): Payer: BC Managed Care – PPO | Admitting: Pediatrics

## 2021-01-22 ENCOUNTER — Encounter: Payer: Self-pay | Admitting: Pediatrics

## 2021-01-22 ENCOUNTER — Other Ambulatory Visit: Payer: Self-pay

## 2021-01-22 VITALS — BP 117/67 | HR 87 | Ht 63.25 in | Wt 307.2 lb

## 2021-01-22 DIAGNOSIS — F4323 Adjustment disorder with mixed anxiety and depressed mood: Secondary | ICD-10-CM | POA: Diagnosis not present

## 2021-01-22 DIAGNOSIS — L600 Ingrowing nail: Secondary | ICD-10-CM

## 2021-01-22 DIAGNOSIS — E538 Deficiency of other specified B group vitamins: Secondary | ICD-10-CM

## 2021-01-22 DIAGNOSIS — I10 Essential (primary) hypertension: Secondary | ICD-10-CM

## 2021-01-22 DIAGNOSIS — L0292 Furuncle, unspecified: Secondary | ICD-10-CM | POA: Diagnosis not present

## 2021-01-22 DIAGNOSIS — F902 Attention-deficit hyperactivity disorder, combined type: Secondary | ICD-10-CM | POA: Diagnosis not present

## 2021-01-22 DIAGNOSIS — R7982 Elevated C-reactive protein (CRP): Secondary | ICD-10-CM

## 2021-01-22 MED ORDER — MUPIROCIN 2 % EX OINT: 1.0000 "application " | TOPICAL_OINTMENT | Freq: Two times a day (BID) | CUTANEOUS | 3 refills | Status: DC

## 2021-01-22 MED ORDER — DULOXETINE HCL 60 MG PO CPEP: 60.0000 mg | ORAL_CAPSULE | Freq: Two times a day (BID) | ORAL | 0 refills | Status: DC

## 2021-01-22 MED ORDER — METHYLPHENIDATE HCL ER (OSM) 54 MG PO TBCR: 54.0000 mg | EXTENDED_RELEASE_TABLET | Freq: Every day | ORAL | 0 refills | Status: DC

## 2021-01-22 NOTE — Patient Instructions (Addendum)
Epsom salt soaks in warm water twice daily for your toes and then take a small piece of cotton ball with neosporin and gently get under the corner of the nail. Repeat until nail has grown out Bring B12 next time!  Increase cymbalta to 60 mg twice daily  Continue concerta  Mupirocin to areas where you get a boil

## 2021-01-22 NOTE — Progress Notes (Signed)
History was provided by the patient.  Emily [name] is a 21 y.o. female who is here for anxiety, depression, ADHD, low b12, hypertension, elevated inflammatory markers, joint pain.  Burnard Bunting, MD   HPI:  Pt reports she is not in a good place. She is "fed up" and tired. Says she had a good talk with her therapist about it. She is still struggling with being bullied by her mother but she doesn't have anywhere else to live at this point. Feels very beat down but not suicidal   Has an area of bruising and a lump in her chest area. She had some chest pain right before this started. She does feel it has gotten some smaller.   Having ingrown big toenails on bilateral big toes.   Needs refill on concerta. Hasn't been taking it consistently.   Still taking OCP and not having any issues with menstrual bleeding.   Denies any active suicidal thoughts but does feel sometimes desperate and sad that she can't do it anymore and very overwhelmed.   Has f/u with rheumatology this month to review results and plan.    No LMP recorded. (Menstrual status: Oral contraceptives).   Patient Active Problem List   Diagnosis Date Noted   Acne rosacea 01/17/2021   Elevated C-reactive protein (CRP) 12/11/2020   Elevated sed rate 12/11/2020   Arthralgia 11/27/2020   Social anxiety disorder 08/02/2020   B12 deficiency 06/05/2020   Dry eye 05/25/2020   Vitamin D deficiency 05/25/2020   Epistaxis, recurrent 01/05/2020   Ankle weakness 12/01/2019   Mixed hyperlipidemia 12/01/2019   Paresthesia of skin 05/27/2019   BMI 50.0-59.9, adult ([place]) 05/12/2019   Chronic fatigue 05/12/2019   Chronic left-sided low back pain without sciatica 12/02/2017   Interstitial cystitis (chronic) with hematuria 99991111   Partially duplicated ureter 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   Hypertension 03/25/2017   Midline thoracic back pain 09/27/2015   Acne vulgaris 09/27/2015   Anorexia nervosa, binge-eating purging type 04/11/2015    Dysmenorrhea 02/27/2015   Sleep disturbance 11/18/2014   ADHD (attention deficit hyperactivity disorder) 08/24/2014   Adjustment disorder with mixed anxiety and depressed mood 08/23/2014    Current Outpatient Medications on File Prior to Visit  Medication Sig Dispense Refill   albuterol (VENTOLIN HFA) 108 (90 Base) MCG/ACT inhaler INHALE 2 PUFFS INTO THE LUNGS EVERY 4 HOURS AS NEEDED FOR WHEEZE OR FOR SHORTNESS OF BREATH 18 each 1   ALTAVERA 0.15-30 MG-MCG tablet Take by mouth.     clindamycin (CLEOCIN T) 1 % lotion      DULoxetine (CYMBALTA) 60 MG capsule TAKE 1 CAPSULE BY MOUTH EVERY DAY 90 capsule 0   lisinopril (ZESTRIL) 10 MG tablet TAKE 1 TABLET BY MOUTH EVERY DAY 90 tablet 1   naproxen (NAPROSYN) 500 MG tablet TAKE 1 TABLET BY MOUTH TWICE A DAY AS NEEDED 60 tablet 1   omeprazole (PRILOSEC) 20 MG capsule TAKE 1 CAPSULE BY MOUTH EVERY DAY 90 capsule 0   tretinoin (RETIN-A) 0.025 % cream Apply topically at bedtime. 45 g 3   cyanocobalamin (,VITAMIN B-12,) 1000 MCG/ML injection INJECT 1 ML (1,000 MCG TOTAL) INTO THE MUSCLE 3 (THREE) TIMES A WEEK FOR 12 DOSES. (Patient not taking: Reported on 01/17/2021) 12 mL 0   ferrous fumarate (FERRETTS) 325 (106 Fe) MG TABS tablet TAKE 1 TABLET EVERY OTHER DAY WITH VITAMIN C (Patient not taking: Reported on 01/17/2021) 45 tablet 1   methylphenidate 54 MG PO CR tablet Take 1 tablet (54 mg total) by  mouth daily with breakfast. (Patient not taking: Reported on 01/17/2021) 30 tablet 0   [DISCONTINUED] pantoprazole (PROTONIX) 40 MG tablet TAKE 1 TABLET BY MOUTH EVERY DAY 90 tablet 1   No current facility-administered medications on file prior to visit.    Allergies  Allergen Reactions   Dust Mite Extract Other (See Comments)    Sneezing, coughing, runny nose Sneezing, coughing, runny nose     Physical Exam:    Vitals:   01/22/21 0940  BP: 117/67  Pulse: 87  Weight: (!) 307 lb 3.2 oz (139.3 kg)  Height: 5' 3.25" (1.607 m)    Growth percentile  Smart[employer] can only be used for patients less than 50 years old.  Physical Exam Vitals and nursing note reviewed.  Constitutional:      General: She is not in acute distress.    Appearance: She is well-developed.  Neck:     Thyroid: No thyromegaly.  Cardiovascular:     Rate and Rhythm: Normal rate and regular rhythm.     Heart sounds: No murmur heard. Pulmonary:     Breath sounds: Normal breath sounds.  Abdominal:     Palpations: Abdomen is soft. There is no mass.     Tenderness: There is no abdominal tenderness. There is no guarding.  Musculoskeletal:        General: Normal range of motion.     Right lower leg: No edema.     Left lower leg: No edema.  Feet:     Right foot:     Toenail Condition: Right toenails are ingrown.     Left foot:     Toenail Condition: Left toenails are ingrown.     Comments: Ingrown big toenails bilaterally. No evidence of infection. Lymphadenopathy:     Cervical: No cervical adenopathy.  Skin:    General: Skin is warm.     Findings: Lesion present. No rash.     Comments: Firm, red area with small white head at center on mid chest   Neurological:     Mental Status: She is alert.     Comments: No tremor  Psychiatric:        Mood and Affect: Mood is anxious. Affect is tearful.    Assessment/Plan: 1. Attention deficit hyperactivity disorder (ADHD), combined type Start concerta and take more consistently. Mood is better when she is taking and she is able to be up and more productive.  - methylphenidate 54 MG PO CR tablet; Take 1 tablet (54 mg total) by mouth daily with breakfast.  Dispense: 30 tablet; Refill: 0  2. Adjustment disorder with mixed anxiety and depressed mood Will increase cymbalta to 60 mg BID  to see if this improves things. She is very aware and insightful that until her living environment changes it may be very difficult to make measurable gains in her mental health. Continues with therapist and is safe to self today.  -  DULoxetine (CYMBALTA) 60 MG capsule; Take 1 capsule (60 mg total) by mouth 2 (two) times daily.  Dispense: 180 capsule; Refill: 0  3. Primary hypertension Improved today, urine at request of rheum- she was unable to leave sample at last visit.  - Protein / creatinine ratio, urine  4. Elevated C-reactive protein (CRP) Elevated C3 complement as well on labs- management per rheum at f/u  5. B12 deficiency Bring injection next visit.   6. Boil Mupirocin until gone - mupirocin ointment (BACTROBAN) 2 %; Apply 1 application topically 2 (two) times daily.  Dispense:  60 g; Refill: 3  7. Ingrown toenail without infection Advised warm epsom salt soaks and gently lifting nail over time, then not cutting so short in the future. Can send to podiatry as needed.   Return in 3 weeks or sooner if needed   Jonathon Resides, FNP  I spent >40 minutes spent face to face with patient with more than 50% of appointment spent discussing diagnosis, management, follow-up, and reviewing of anxiety, depression, adhd, rheum stuff, toenails, hypertension, b12. I spent an additional 5 minutes on pre-and post-visit activities.

## 2021-01-23 DIAGNOSIS — F5002 Anorexia nervosa, binge eating/purging type: Secondary | ICD-10-CM | POA: Diagnosis not present

## 2021-01-23 LAB — PROTEIN / CREATININE RATIO, URINE
Creatinine, Urine: 166 mg/dL (ref 20–275)
Protein/Creat Ratio: 54 mg/g creat (ref 24–184)
Protein/Creatinine Ratio: 0.054 mg/mg creat (ref 0.024–0.184)
Total Protein, Urine: 9 mg/dL (ref 5–24)

## 2021-01-26 ENCOUNTER — Other Ambulatory Visit: Payer: Self-pay | Admitting: Family

## 2021-01-26 DIAGNOSIS — K219 Gastro-esophageal reflux disease without esophagitis: Secondary | ICD-10-CM

## 2021-01-30 DIAGNOSIS — F509 Eating disorder, unspecified: Secondary | ICD-10-CM | POA: Diagnosis not present

## 2021-02-06 DIAGNOSIS — F509 Eating disorder, unspecified: Secondary | ICD-10-CM | POA: Diagnosis not present

## 2021-02-06 NOTE — Progress Notes (Deleted)
Office Visit Note  Patient: Emily [name]             Date of Birth: Feb 05, 2000           MRN: [id number]             PCP: Burnard Bunting, MD Referring: Trude Mcburney, FNP Visit Date: 02/07/2021   Subjective:  No chief complaint on file.   History of Present Illness: Emily [name] is a 21 y.o. female here for follow up for initial visit with multiple ongoing symptoms with elevated inflammatory markers. Labs at our initial visit with normal immunoglobulins, high complement C3, normal CK, and inflammatory markers were still high but less than before with downward trend. Urine P/C ratio was checked and normal.***   Previous HPI 01/17/21 Emily [name] is a 21 y.o. female here for evaluation of fatigue, joint pains, dry eyes with elevated ESR and CRP.  She has a chronic history of multiple symptoms going back to at least about 7 years ago.  At that time she had eating disorder with some problems attributed to malnutrition and this condition but have persisted despite resolution of her eating disorder problems.  Mostly with a lot of fatigue, intermittent paresthesias and sensitivity, insomnia, skin rashes, and body pains especially in the back and upper arms.  She feels these have overall just progressed with time and now feels like muscle and joint pains are frequently limiting activities.  She does not usually notice any joint swelling warmth or erythema.  Worst affected areas are in the upper and lower back bilaterally.  She is done some work with physical therapy without a long persistent benefit to symptoms. She takes Cymbalta primarily for anxiety and depression symptoms.  She takes naproxen as needed which is also partially helpful for joint and muscle pains. She has taken Flexeril in the past for muscular pain and trazodone for insomnia but feels this medicine did not do well with her.  She had previous sleep study that was negative for significant sleep apnea. Skin  rashes on the face have been described as probably rosacea she has flushing with stress and with alcohol.  She maintains chronic mild rashes on the arms sometimes eczema type symptoms.  She is frequently heat intolerance and feels hot with some skin flushing.  Denies discoloration in hands and feet such as pallor or cyanosis. Dry eye symptoms are a major complaint with some worsening over time for which she was concerned of possible sjogren's syndrome but negative serology to date. She uses eye drops for this currently and has discussed possible going to restasis treatment with her eye doctor. She does not have as much problem with mouth dryness, although drinks water very frequently. She is also had chronic problems with interstitial cystitis apparently some structural abnormality with the ureters and decreased bladder volume.  Previous work-up including contrast CT study of the abdomen with some possible cystic changes but not thought to represent polycystic kidney disease.  Urinalysis has shown persistent proteinuria usually without other findings and renal function remains normal.   Labs reviewed 11/2020 ANA neg dsDNA, SSA, SSB neg RF neg CCP neg ESR 41, 43 CRP 24.8, 35.5 CBC WBC 11.6 Plts 493 CMP wnl TSH 2.64 UA +protein   05/2020 Ferritin 7 Vit D 21   No Rheumatology ROS completed.   PMFS History:  Patient Active Problem List   Diagnosis Date Noted   Acne rosacea 01/17/2021   Elevated C-reactive protein (CRP) 12/11/2020  Elevated sed rate 12/11/2020   Arthralgia 11/27/2020   Social anxiety disorder 08/02/2020   B12 deficiency 06/05/2020   Dry eye 05/25/2020   Vitamin D deficiency 05/25/2020   Epistaxis, recurrent 01/05/2020   Ankle weakness 12/01/2019   Mixed hyperlipidemia 12/01/2019   Paresthesia of skin 05/27/2019   BMI 50.0-59.9, adult ([place]) 05/12/2019   Chronic fatigue 05/12/2019   Chronic left-sided low back pain without sciatica 12/02/2017   Interstitial  cystitis (chronic) with hematuria 01/77/9390   Partially duplicated ureter 30/09/2328   Hypertension 03/25/2017   Midline thoracic back pain 09/27/2015   Acne vulgaris 09/27/2015   Anorexia nervosa, binge-eating purging type 04/11/2015   Dysmenorrhea 02/27/2015   Sleep disturbance 11/18/2014   ADHD (attention deficit hyperactivity disorder) 08/24/2014   Adjustment disorder with mixed anxiety and depressed mood 08/23/2014    Past Medical History:  Diagnosis Date   ADHD (attention deficit hyperactivity disorder)    Anorexia    Anxiety    Asthma    B12 deficiency    Bulimia    Depression    Morbid obesity ([place])     Family History  Problem Relation Age of Onset   Anxiety disorder Mother    Drug abuse Father    Hyperlipidemia Maternal Grandmother    Hypertension Maternal Grandmother    Hyperlipidemia Maternal Grandfather    Hypertension Maternal Grandfather    Colon cancer Neg Hx    Pancreatic cancer Neg Hx    Esophageal cancer Neg Hx    Past Surgical History:  Procedure Laterality Date   ADENOIDECTOMY     BLADDER REPAIR     LIGAMENT REPAIR     TONSILLECTOMY     Social History   Social History Narrative   Not on file   Immunization History  Administered Date(s) Administered   DTaP 12/05/1998, 02/01/1999, 04/15/1999, 12/31/1999, 12/20/2002   HPV 9-valent 01/01/2010, 04/04/2010, 08/07/2010   HPV Quadrivalent 01/01/2010, 04/04/2010, 08/07/2010   Hepatitis A, Adult 12/23/2004, 10/13/2005   Hepatitis A, Ped/Adol-2 Dose 12/23/2004, 10/13/2005   Hepatitis B, ped/adol August 18, 1998, 10/30/1998, 07/01/1999   HiB (PRP-OMP) 12/05/1998, 02/01/1999, 04/15/1999, 12/31/1999   HiB (PRP-T) 12/05/1998, 02/01/1999, 04/15/1999, 12/31/1999   IPV 12/05/1998, 02/01/1999, 04/15/1999, 12/20/2002   Influenza Split 11/10/2011, 11/12/2012, 11/09/2013, 10/14/2018   Influenza,inj,Quad PF,6+ Mos 10/14/2018   Influenza,inj,Quad PF,6-35 Mos 11/25/2016   Influenza,inj,quad, With Preservative  10/03/2014   Influenza-Unspecified 11/25/2016   MMR 09/30/1999, 12/20/2002   Meningococcal B, OMV 11/25/2016, 01/29/2017   Meningococcal Conjugate 01/01/2010, 10/03/2014   Meningococcal Mcv4o 01/01/2010, 10/03/2014   Moderna Sars-Covid-2 Vaccination 02/14/2019, 04/03/2019   Pneumococcal Polysaccharide-23 12/05/1998, 02/01/1999, 04/15/1999, 12/31/1999   Pneumococcal-Unspecified 12/05/1998, 02/01/1999, 04/15/1999, 12/31/1999   Td 05/21/2009, 07/28/2019   Tdap 05/21/2009   Varicella 09/30/1999, 12/23/2004     Objective: Vital Signs: There were no vitals taken for this visit.   Physical Exam   Musculoskeletal Exam: ***  CDAI Exam: CDAI Score: -- Patient Global: --; Provider Global: -- Swollen: --; Tender: -- Joint Exam 02/07/2021   No joint exam has been documented for this visit   There is currently no information documented on the homunculus. Go to the Rheumatology activity and complete the homunculus joint exam.  Investigation: No additional findings.  Imaging: No results found.  Recent Labs: Lab Results  Component Value Date   WBC 11.6 (H) 11/27/2020   HGB 12.1 11/27/2020   PLT 493 (H) 11/27/2020   NA 138 11/27/2020   K 4.4 11/27/2020   CL 105 11/27/2020   CO2 22 11/27/2020  GLUCOSE 118 (H) 11/27/2020   BUN 15 11/27/2020   CREATININE 0.69 11/27/2020   BILITOT 0.3 11/27/2020   ALKPHOS 24 (L) 04/21/2016   AST 13 11/27/2020   ALT 11 11/27/2020   PROT 6.8 11/27/2020   ALBUMIN 3.8 04/21/2016   CALCIUM 9.3 11/27/2020    Speciality Comments: No specialty comments available.  Procedures:  No procedures performed Allergies: Dust mite extract   Assessment / Plan:     Visit Diagnoses: No diagnosis found.  ***  Orders: No orders of the defined types were placed in this encounter.  No orders of the defined types were placed in this encounter.    Follow-Up Instructions: No follow-ups on file.   Collier Salina, MD  Note - This record has been  created using [employer].  Chart creation errors have been sought, but may not always  have been located. Such creation errors do not reflect on  the standard of medical care.

## 2021-02-07 ENCOUNTER — Ambulatory Visit: Payer: BC Managed Care – PPO | Admitting: Internal Medicine

## 2021-02-08 ENCOUNTER — Other Ambulatory Visit: Payer: Self-pay | Admitting: Pediatrics

## 2021-02-08 MED ORDER — CYCLOBENZAPRINE HCL 10 MG PO TABS: 10.0000 mg | ORAL_TABLET | Freq: Three times a day (TID) | ORAL | 0 refills | Status: AC | PRN

## 2021-02-12 NOTE — Progress Notes (Signed)
Office Visit Note  Patient: Emily [name]             Date of Birth: 07-Mar-2000           MRN: [id number]             PCP: Geoffry Paradise, MD Referring: Verneda Skill, FNP Visit Date: 02/13/2021   Subjective:   History of Present Illness: Emily [name] is a 21 y.o. female here for follow up for evaluation of chronic back pain and multiple other symptoms findings at last visit with persistent ESR and CRP elevations other tests all negative. She continues to have back pain worst symptoms are lying in bed at night. She also has increased back pain first thing in the morning.  Previous HPI 01/17/21 Emily [name] is a 21 y.o. female here for evaluation of fatigue, joint pains, dry eyes with elevated ESR and CRP.  She has a chronic history of multiple symptoms going back to at least about 7 years ago.  At that time she had eating disorder with some problems attributed to malnutrition and this condition but have persisted despite resolution of her eating disorder problems.  Mostly with a lot of fatigue, intermittent paresthesias and sensitivity, insomnia, skin rashes, and body pains especially in the back and upper arms.  She feels these have overall just progressed with time and now feels like muscle and joint pains are frequently limiting activities.  She does not usually notice any joint swelling warmth or erythema.  Worst affected areas are in the upper and lower back bilaterally.  She is done some work with physical therapy without a long persistent benefit to symptoms. She takes Cymbalta primarily for anxiety and depression symptoms.  She takes naproxen as needed which is also partially helpful for joint and muscle pains. She has taken Flexeril in the past for muscular pain and trazodone for insomnia but feels this medicine did not do well with her.  She had previous sleep study that was negative for significant sleep apnea. Skin rashes on the face have been described as  probably rosacea she has flushing with stress and with alcohol.  She maintains chronic mild rashes on the arms sometimes eczema type symptoms.  She is frequently heat intolerance and feels hot with some skin flushing.  Denies discoloration in hands and feet such as pallor or cyanosis. Dry eye symptoms are a major complaint with some worsening over time for which she was concerned of possible sjogren's syndrome but negative serology to date. She uses eye drops for this currently and has discussed possible going to restasis treatment with her eye doctor. She does not have as much problem with mouth dryness, although drinks water very frequently. She is also had chronic problems with interstitial cystitis apparently some structural abnormality with the ureters and decreased bladder volume.  Previous work-up including contrast CT study of the abdomen with some possible cystic changes but not thought to represent polycystic kidney disease.  Urinalysis has shown persistent proteinuria usually without other findings and renal function remains normal.   Labs reviewed 11/2020 ANA neg dsDNA, SSA, SSB neg RF neg CCP neg ESR 41, 43 CRP 24.8, 35.5 CBC WBC 11.6 Plts 493 CMP wnl TSH 2.64 UA +protein   05/2020 Ferritin 7 Vit D 21   Review of Systems  Constitutional:  Negative for fever.  Eyes:  Negative for pain and redness.  Gastrointestinal:  Negative for blood in stool.  Musculoskeletal:  Positive for joint pain,  joint pain, joint swelling and morning stiffness.  Skin:  Negative for rash.  Neurological:  Negative for numbness and weakness.   PMFS History:  Patient Active Problem List   Diagnosis Date Noted   Acne rosacea 01/17/2021   Elevated C-reactive protein (CRP) 12/11/2020   Elevated sed rate 12/11/2020   Arthralgia 11/27/2020   Social anxiety disorder 08/02/2020   B12 deficiency 06/05/2020   Dry eye 05/25/2020   Vitamin D deficiency 05/25/2020   Epistaxis, recurrent 01/05/2020    Ankle weakness 12/01/2019   Mixed hyperlipidemia 12/01/2019   Paresthesia of skin 05/27/2019   BMI 50.0-59.9, adult ([place]) 05/12/2019   Chronic fatigue 05/12/2019   Chronic left-sided low back pain without sciatica 12/02/2017   Interstitial cystitis (chronic) with hematuria 84/66/5993   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   Hypertension 03/25/2017   Midline thoracic back pain 09/27/2015   Acne vulgaris 09/27/2015   Anorexia nervosa, binge-eating purging type 04/11/2015   Dysmenorrhea 02/27/2015   Sleep disturbance 11/18/2014   ADHD (attention deficit hyperactivity disorder) 08/24/2014   Adjustment disorder with mixed anxiety and depressed mood 08/23/2014    Past Medical History:  Diagnosis Date   ADHD (attention deficit hyperactivity disorder)    Anorexia    Anxiety    Asthma    B12 deficiency    Bulimia    Depression    Morbid obesity ([place])     Family History  Problem Relation Age of Onset   Anxiety disorder Mother    Drug abuse Father    Hyperlipidemia Maternal Grandmother    Hypertension Maternal Grandmother    Hyperlipidemia Maternal Grandfather    Hypertension Maternal Grandfather    Colon cancer Neg Hx    Pancreatic cancer Neg Hx    Esophageal cancer Neg Hx    Past Surgical History:  Procedure Laterality Date   ADENOIDECTOMY     BLADDER REPAIR     LIGAMENT REPAIR     TONSILLECTOMY     Social History   Social History Narrative   Not on file   Immunization History  Administered Date(s) Administered   DTaP 12/05/1998, 02/01/1999, 04/15/1999, 12/31/1999, 12/20/2002   HPV 9-valent 01/01/2010, 04/04/2010, 08/07/2010   HPV Quadrivalent 01/01/2010, 04/04/2010, 08/07/2010   Hepatitis A, Adult 12/23/2004, 10/13/2005   Hepatitis A, Ped/Adol-2 Dose 12/23/2004, 10/13/2005   Hepatitis B, ped/adol 09-24-98, 10/30/1998, 07/01/1999   HiB (PRP-OMP) 12/05/1998, 02/01/1999, 04/15/1999, 12/31/1999   HiB (PRP-T) 12/05/1998, 02/01/1999, 04/15/1999, 12/31/1999   IPV  12/05/1998, 02/01/1999, 04/15/1999, 12/20/2002   Influenza Split 11/10/2011, 11/12/2012, 11/09/2013, 10/14/2018   Influenza,inj,Quad PF,6+ Mos 10/14/2018   Influenza,inj,Quad PF,6-35 Mos 11/25/2016   Influenza,inj,quad, With Preservative 10/03/2014   Influenza-Unspecified 11/25/2016   MMR 09/30/1999, 12/20/2002   Meningococcal B, OMV 11/25/2016, 01/29/2017   Meningococcal Conjugate 01/01/2010, 10/03/2014   Meningococcal Mcv4o 01/01/2010, 10/03/2014   Moderna Sars-Covid-2 Vaccination 02/14/2019, 04/03/2019   Pneumococcal Polysaccharide-23 12/05/1998, 02/01/1999, 04/15/1999, 12/31/1999   Pneumococcal-Unspecified 12/05/1998, 02/01/1999, 04/15/1999, 12/31/1999   Td 05/21/2009, 07/28/2019   Tdap 05/21/2009   Varicella 09/30/1999, 12/23/2004     Objective: Vital Signs: BP 123/79    Pulse 97    Ht $R'5\' 2"'Dr$  (1.575 m)    Wt (!) 312 lb (141.5 kg)    BMI 57.07 kg/m    Physical Exam Constitutional:      Appearance: She is obese.  Skin:    General: Skin is warm and dry.  Neurological:     Mental Status: She is alert.     Motor: No weakness.  Psychiatric:  Mood and Affect: Mood normal.     Musculoskeletal Exam:  Neck full ROM no tenderness Shoulders full ROM tenderness over trapezius muscles and between scapulae Elbows full ROM no tenderness or swelling Wrists full ROM no tenderness or swelling Fingers full ROM no tenderness or swelling No paraspinal tenderness to palpation over lower back Hip normal internal and external rotation without pain, tenderness to lateral hip palpation worse on left  Investigation: No additional findings.  Imaging: XR Thoracic Spine 2 View  Result Date: 02/16/2021 X-ray thoracic spine 2 views Slight scoliosis of the lower thoracic spine corrects.  No significant spondylolisthesis or vertebral body changes seen.  No acute bony abnormalities.  XR Lumbar Spine 2-3 Views  Result Date: 02/16/2021 X-ray lumbar spine 2 views There is somewhat increased  degree of lumbar lordosis.  No significant spondylolisthesis.  Sacralization of left L5 process unilaterally with slight rotation.  There is slight levoscoliosis.  Vertebral bodies otherwise appear normal.  XR Pelvis 1-2 Views  Result Date: 02/16/2021 X-ray pelvis AP There is unilateral left sacralization of lumbar L5.  There is asymmetric joint widening and bilateral SI joints.  No visible erosions.  There is mild increased sclerosis predominantly on the iliac side.  Hip joint spaces appear normal.   Recent Labs: Lab Results  Component Value Date   WBC 11.6 (H) 11/27/2020   HGB 12.1 11/27/2020   PLT 493 (H) 11/27/2020   NA 138 11/27/2020   K 4.4 11/27/2020   CL 105 11/27/2020   CO2 22 11/27/2020   GLUCOSE 118 (H) 11/27/2020   BUN 15 11/27/2020   CREATININE 0.69 11/27/2020   BILITOT 0.3 11/27/2020   ALKPHOS 24 (L) 04/21/2016   AST 13 11/27/2020   ALT 11 11/27/2020   PROT 6.8 11/27/2020   ALBUMIN 3.8 04/21/2016   CALCIUM 9.3 11/27/2020    Speciality Comments: No specialty comments available.  Procedures:  No procedures performed Allergies: Dust mite extract   Assessment / Plan:     Visit Diagnoses: Chronic left-sided low back pain without sciatica - Plan: XR Thoracic Spine 2 View, XR Lumbar Spine 2-3 Views, XR Pelvis 1-2 Views, HLA-B27 antigen, predniSONE (DELTASONE) 10 MG tablet  Chronic low back pain worst on left side she is having worse pain than usual now. Symptoms are not entirely typical for inflammatory low back pain. Xray of lumbar spine and pelvis demonstrate sacralization of L5 on left side this could be causing back pain due to rotation and tilt. However bilateral SI joint changes suggest possible sacroiliitis she has no trauma or activity history to obviously explain these. Checking HLA-B27 allele test today if positive would strongly suspect a spondyloarthritis problem. If negative may need to consider MRI to test for active inflammation. Will also treat with a short  course of prednisone today for back pain exacerbation and to see how symptoms behave.  Chronic midline thoracic back pain   X-ray of thoracic spine without any obvious abnormality to explain midline pain in this area.  Elevated sed rate Elevated C-reactive protein (CRP) - Plan: HLA-B27 antigen  Chronic fatigue   Orders: Orders Placed This Encounter  Procedures   XR Thoracic Spine 2 View   XR Lumbar Spine 2-3 Views   XR Pelvis 1-2 Views   HLA-B27 antigen   Meds ordered this encounter  Medications   predniSONE (DELTASONE) 10 MG tablet    Sig: Take by mouth daily with food decrease dose daily: 6, 5, 4, 3, 2, then 1 tablet daily  Dispense:  21 tablet    Refill:  0     Follow-Up Instructions: Return in about 2 months (around 04/13/2021) for ?AxSpA f/u 27mos.   Collier Salina, MD  Note - This record has been created using [employer].  Chart creation errors have been sought, but may not always  have been located. Such creation errors do not reflect on  the standard of medical care.

## 2021-02-13 ENCOUNTER — Other Ambulatory Visit: Payer: Self-pay

## 2021-02-13 ENCOUNTER — Ambulatory Visit: Payer: Self-pay

## 2021-02-13 ENCOUNTER — Encounter: Payer: Self-pay | Admitting: Internal Medicine

## 2021-02-13 ENCOUNTER — Ambulatory Visit (INDEPENDENT_AMBULATORY_CARE_PROVIDER_SITE_OTHER): Payer: BC Managed Care – PPO | Admitting: Internal Medicine

## 2021-02-13 VITALS — BP 123/79 | HR 97 | Ht 62.0 in | Wt 312.0 lb

## 2021-02-13 DIAGNOSIS — M546 Pain in thoracic spine: Secondary | ICD-10-CM | POA: Diagnosis not present

## 2021-02-13 DIAGNOSIS — R5382 Chronic fatigue, unspecified: Secondary | ICD-10-CM

## 2021-02-13 DIAGNOSIS — R7982 Elevated C-reactive protein (CRP): Secondary | ICD-10-CM

## 2021-02-13 DIAGNOSIS — R700 Elevated erythrocyte sedimentation rate: Secondary | ICD-10-CM | POA: Diagnosis not present

## 2021-02-13 DIAGNOSIS — M545 Low back pain, unspecified: Secondary | ICD-10-CM | POA: Diagnosis not present

## 2021-02-13 DIAGNOSIS — G8929 Other chronic pain: Secondary | ICD-10-CM

## 2021-02-13 MED ORDER — PREDNISONE 10 MG PO TABS: ORAL_TABLET | ORAL | 0 refills | Status: DC

## 2021-02-14 LAB — HLA-B27 ANTIGEN: HLA-B27 Antigen: NEGATIVE

## 2021-02-20 DIAGNOSIS — F439 Reaction to severe stress, unspecified: Secondary | ICD-10-CM | POA: Diagnosis not present

## 2021-02-20 DIAGNOSIS — F509 Eating disorder, unspecified: Secondary | ICD-10-CM | POA: Diagnosis not present

## 2021-02-20 DIAGNOSIS — F331 Major depressive disorder, recurrent, moderate: Secondary | ICD-10-CM | POA: Diagnosis not present

## 2021-02-27 DIAGNOSIS — F509 Eating disorder, unspecified: Secondary | ICD-10-CM | POA: Diagnosis not present

## 2021-02-27 DIAGNOSIS — F439 Reaction to severe stress, unspecified: Secondary | ICD-10-CM | POA: Diagnosis not present

## 2021-02-27 DIAGNOSIS — F331 Major depressive disorder, recurrent, moderate: Secondary | ICD-10-CM | POA: Diagnosis not present

## 2021-03-06 DIAGNOSIS — F439 Reaction to severe stress, unspecified: Secondary | ICD-10-CM | POA: Diagnosis not present

## 2021-03-06 DIAGNOSIS — F331 Major depressive disorder, recurrent, moderate: Secondary | ICD-10-CM | POA: Diagnosis not present

## 2021-03-06 DIAGNOSIS — F509 Eating disorder, unspecified: Secondary | ICD-10-CM | POA: Diagnosis not present

## 2021-03-07 ENCOUNTER — Ambulatory Visit: Payer: BC Managed Care – PPO | Admitting: Pediatrics

## 2021-03-08 ENCOUNTER — Other Ambulatory Visit: Payer: Self-pay | Admitting: Pediatrics

## 2021-03-08 DIAGNOSIS — F4323 Adjustment disorder with mixed anxiety and depressed mood: Secondary | ICD-10-CM

## 2021-03-14 ENCOUNTER — Ambulatory Visit: Payer: BC Managed Care – PPO | Admitting: Pediatrics

## 2021-03-19 ENCOUNTER — Encounter: Payer: Self-pay | Admitting: Pediatrics

## 2021-03-19 ENCOUNTER — Ambulatory Visit (INDEPENDENT_AMBULATORY_CARE_PROVIDER_SITE_OTHER): Payer: BC Managed Care – PPO | Admitting: Pediatrics

## 2021-03-19 VITALS — BP 115/75 | HR 104 | Ht 63.39 in | Wt 312.4 lb

## 2021-03-19 DIAGNOSIS — F902 Attention-deficit hyperactivity disorder, combined type: Secondary | ICD-10-CM | POA: Diagnosis not present

## 2021-03-19 DIAGNOSIS — F4323 Adjustment disorder with mixed anxiety and depressed mood: Secondary | ICD-10-CM

## 2021-03-19 DIAGNOSIS — I10 Essential (primary) hypertension: Secondary | ICD-10-CM | POA: Diagnosis not present

## 2021-03-19 DIAGNOSIS — F4010 Social phobia, unspecified: Secondary | ICD-10-CM

## 2021-03-19 DIAGNOSIS — E538 Deficiency of other specified B group vitamins: Secondary | ICD-10-CM | POA: Diagnosis not present

## 2021-03-19 MED ORDER — CYANOCOBALAMIN 1000 MCG/ML IJ SOLN
1000.0000 ug | Freq: Once | INTRAMUSCULAR | Status: AC
  Administered 2021-03-19: 1000 ug via INTRAMUSCULAR

## 2021-03-19 NOTE — Patient Instructions (Signed)
Call and ask for MRI to be ordered before next visit with rheumatology  ?Increase your cymbalta to 60 mg twice daily  ?Remember to take your concerta every day  ? ?

## 2021-03-19 NOTE — Progress Notes (Signed)
History was provided by the patient. ? ?Emily [name] is a 21 y.o. female who is here for anxiety, depression, joint pain, ADHD, sleep, b12 deficiency.  ?Geoffry Paradise, MD  ? ?HPI:  Pt reports she was having a good day this morning but had issues with parking at the office and so got upset. Trying to recover from negative headspace.  ? ?Continues to have down mood. She is taking her medicines consistently. Still not taking concerta super consistently. She has not increased her cymbalta to 60 mg BID.  ? ?She continues to struggle with school and expectations that others have of her. She would like a note allowing her to drop a class from last semester and drop a class she is taking this semester. She continues to see her therapist.  ? ?PHQ-SADS Last 3 Score only 03/22/2021 11/27/2020 08/02/2020  ?PHQ-15 Score 16 11 12   ?Total GAD-7 Score 16 5 6   ?PHQ Adolescent Score 13 7 13   ?  ? ? ?No LMP recorded. (Menstrual status: Oral contraceptives). ? ? ?Patient Active Problem List  ? Diagnosis Date Noted  ? Acne rosacea 01/17/2021  ? Elevated C-reactive protein (CRP) 12/11/2020  ? Elevated sed rate 12/11/2020  ? Arthralgia 11/27/2020  ? Social anxiety disorder 08/02/2020  ? B12 deficiency 06/05/2020  ? Dry eye 05/25/2020  ? Vitamin D deficiency 05/25/2020  ? Epistaxis, recurrent 01/05/2020  ? Ankle weakness 12/01/2019  ? Mixed hyperlipidemia 12/01/2019  ? Paresthesia of skin 05/27/2019  ? BMI 50.0-59.9, adult (HCC) 05/12/2019  ? Chronic fatigue 05/12/2019  ? Chronic left-sided low back pain without sciatica 12/02/2017  ? Interstitial cystitis (chronic) with hematuria 11/26/2017  ? 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 Hypertension 03/25/2017  ? Midline thoracic back pain 09/27/2015  ? Acne vulgaris 09/27/2015  ? Anorexia nervosa, binge-eating purging type 04/11/2015  ? Dysmenorrhea 02/27/2015  ? Sleep disturbance 11/18/2014  ? ADHD (attention deficit hyperactivity disorder) 08/24/2014  ? Adjustment disorder with  mixed anxiety and depressed mood 08/23/2014  ? ? ?Current Outpatient Medications on File Prior to Visit  ?Medication Sig Dispense Refill  ? albuterol (VENTOLIN HFA) 108 (90 Base) MCG/ACT inhaler INHALE 2 PUFFS INTO THE LUNGS EVERY 4 HOURS AS NEEDED FOR WHEEZE OR FOR SHORTNESS OF BREATH 18 each 1  ? clindamycin (CLEOCIN T) 1 % lotion     ? cyclobenzaprine (FLEXERIL) 10 MG tablet Take 1 tablet (10 mg total) by mouth 3 (three) times daily as needed for muscle spasms. 30 tablet 0  ? DULoxetine (CYMBALTA) 60 MG capsule TAKE 1 CAPSULE BY MOUTH 2 TIMES DAILY. 180 capsule 1  ? lisinopril (ZESTRIL) 10 MG tablet TAKE 1 TABLET BY MOUTH EVERY DAY 90 tablet 1  ? methylphenidate 54 MG PO CR tablet Take 1 tablet (54 mg total) by mouth daily with breakfast. 30 tablet 0  ? mupirocin ointment (BACTROBAN) 2 % Apply 1 application topically 2 (two) times daily. 60 g 3  ? naproxen (NAPROSYN) 500 MG tablet TAKE 1 TABLET BY MOUTH TWICE A DAY AS NEEDED 60 tablet 1  ? omeprazole (PRILOSEC) 20 MG capsule TAKE 1 CAPSULE BY MOUTH EVERY DAY 90 capsule 0  ? predniSONE (DELTASONE) 10 MG tablet Take by mouth daily with food decrease dose daily: 6, 5, 4, 3, 2, then 1 tablet daily 21 tablet 0  ? ALTAVERA 0.15-30 MG-MCG tablet Take by mouth. (Patient not taking: Reported on 02/13/2021)    ? tretinoin (RETIN-A) 0.025 % cream Apply topically at bedtime. (Patient not taking: Reported  on 02/13/2021) 45 g 3  ? [DISCONTINUED] pantoprazole (PROTONIX) 40 MG tablet TAKE 1 TABLET BY MOUTH EVERY DAY 90 tablet 1  ? ?No current facility-administered medications on file prior to visit.  ? ? ?Allergies  ?Allergen Reactions  ? Dust Mite Extract Other (See Comments)  ?  Sneezing, coughing, runny nose ?Sneezing, coughing, runny nose ?  ? ? ?Physical Exam:  ?  ?Vitals:  ? 03/19/21 1413  ?BP: 115/75  ?Pulse: (!) 104  ?Weight: (!) 312 lb 6.4 oz (141.7 kg)  ?Height: 5' 3.39" (1.61 m)  ? ? ?Growth percentile SmartLinks can only be used for patients less than 59 years  old. ? ?Physical Exam ?Vitals and nursing note reviewed.  ?Constitutional:   ?   General: She is not in acute distress. ?   Appearance: She is well-developed.  ?Neck:  ?   Thyroid: No thyromegaly.  ?Cardiovascular:  ?   Rate and Rhythm: Normal rate and regular rhythm.  ?   Heart sounds: No murmur heard. ?Pulmonary:  ?   Breath sounds: Normal breath sounds.  ?Abdominal:  ?   Palpations: Abdomen is soft. There is no mass.  ?   Tenderness: There is no abdominal tenderness. There is no guarding.  ?Musculoskeletal:  ?   Right lower leg: No edema.  ?   Left lower leg: No edema.  ?Lymphadenopathy:  ?   Cervical: No cervical adenopathy.  ?Skin: ?   General: Skin is warm.  ?   Findings: No rash.  ?Neurological:  ?   Mental Status: She is alert.  ?   Comments: No tremor  ?Psychiatric:     ?   Mood and Affect: Mood is anxious. Affect is tearful.  ? ? ?Assessment/Plan: ?1. B12 deficiency ?Injection today in clinic. Suspect chronic use of PPI is contributing to poor absorption. Continue to discuss with her we should do her injections consistently but she has difficulty always remembering this.  ?- cyanocobalamin ((VITAMIN B-12)) injection 1,000 mcg ? ?2. Attention deficit hyperactivity disorder (ADHD), combined type ?Continue concerta and improve compliance.  ? ?3. Adjustment disorder with mixed anxiety and depressed mood ?Increase cymbalta to 60 mg bid as discussed at last visit. I will write her letter for school. Discussed at length potentially considering smaller class load.  ? ?4. Primary hypertension ?Good today.  ? ?5. Social anxiety disorder ?As above.  ? ?Continues to see rheum as well.  ? ?Return in 6 weeks  ? ?Alfonso Ramus, FNP ? ?I spent >40 minutes spent face to face with patient with more than 50% of appointment spent discussing diagnosis, management, follow-up, and reviewing of anxiety, depression, joint pain, adhd, sleep. I spent an additional 15 minutes on pre-and post-visit activities.   ? ? ?

## 2021-03-20 DIAGNOSIS — F509 Eating disorder, unspecified: Secondary | ICD-10-CM | POA: Diagnosis not present

## 2021-03-20 DIAGNOSIS — F331 Major depressive disorder, recurrent, moderate: Secondary | ICD-10-CM | POA: Diagnosis not present

## 2021-03-20 DIAGNOSIS — F439 Reaction to severe stress, unspecified: Secondary | ICD-10-CM | POA: Diagnosis not present

## 2021-03-26 ENCOUNTER — Other Ambulatory Visit: Payer: Self-pay | Admitting: Family

## 2021-03-26 DIAGNOSIS — E538 Deficiency of other specified B group vitamins: Secondary | ICD-10-CM

## 2021-03-27 DIAGNOSIS — F439 Reaction to severe stress, unspecified: Secondary | ICD-10-CM | POA: Diagnosis not present

## 2021-03-27 DIAGNOSIS — F331 Major depressive disorder, recurrent, moderate: Secondary | ICD-10-CM | POA: Diagnosis not present

## 2021-03-27 DIAGNOSIS — F509 Eating disorder, unspecified: Secondary | ICD-10-CM | POA: Diagnosis not present

## 2021-03-28 DIAGNOSIS — R300 Dysuria: Secondary | ICD-10-CM | POA: Diagnosis not present

## 2021-04-01 ENCOUNTER — Other Ambulatory Visit: Payer: Self-pay | Admitting: Pediatrics

## 2021-04-01 DIAGNOSIS — I10 Essential (primary) hypertension: Secondary | ICD-10-CM

## 2021-04-03 DIAGNOSIS — F439 Reaction to severe stress, unspecified: Secondary | ICD-10-CM | POA: Diagnosis not present

## 2021-04-03 DIAGNOSIS — F509 Eating disorder, unspecified: Secondary | ICD-10-CM | POA: Diagnosis not present

## 2021-04-03 DIAGNOSIS — F331 Major depressive disorder, recurrent, moderate: Secondary | ICD-10-CM | POA: Diagnosis not present

## 2021-04-16 ENCOUNTER — Ambulatory Visit (INDEPENDENT_AMBULATORY_CARE_PROVIDER_SITE_OTHER): Payer: BC Managed Care – PPO | Admitting: Pediatrics

## 2021-04-16 ENCOUNTER — Encounter: Payer: Self-pay | Admitting: Pediatrics

## 2021-04-16 VITALS — BP 124/84 | HR 108 | Ht 63.3 in | Wt 307.8 lb

## 2021-04-16 DIAGNOSIS — E538 Deficiency of other specified B group vitamins: Secondary | ICD-10-CM | POA: Diagnosis not present

## 2021-04-16 DIAGNOSIS — F902 Attention-deficit hyperactivity disorder, combined type: Secondary | ICD-10-CM

## 2021-04-16 DIAGNOSIS — F339 Major depressive disorder, recurrent, unspecified: Secondary | ICD-10-CM | POA: Diagnosis not present

## 2021-04-16 DIAGNOSIS — F4010 Social phobia, unspecified: Secondary | ICD-10-CM | POA: Diagnosis not present

## 2021-04-16 DIAGNOSIS — I10 Essential (primary) hypertension: Secondary | ICD-10-CM

## 2021-04-16 NOTE — Patient Instructions (Addendum)
Take a look at [facility] or similar  ?Consider seeing psychiatry for TMS  ? ?[email or web address]  ? ? ?

## 2021-04-16 NOTE — Progress Notes (Signed)
History was provided by the patient. ? ?Emily [name] is a 21 y.o. female who is here for anxiety, depression, ADHD.  ?Geoffry Paradise, MD  ? ?HPI:  Pt reports she has been consistently taking increased dose of cymbalta and concerta but the concerta made her have more of a hyper energy that she didn't like. Also having intrusive thoughts with the concerta, though these have improved.  ? ?She is still very depressed despite the above. Still having trouble with school and knowing what to do with her life going forward. Feeling very stuck.  ? ?Open to considering other options for treatment resistant depression.  ? ?Brought B12 to get today.  ? ?No LMP recorded. (Menstrual status: Oral contraceptives). ? ?ROS ? ?Patient Active Problem List  ? Diagnosis Date Noted  ? Acne rosacea 01/17/2021  ? Elevated C-reactive protein (CRP) 12/11/2020  ? Elevated sed rate 12/11/2020  ? Arthralgia 11/27/2020  ? Social anxiety disorder 08/02/2020  ? B12 deficiency 06/05/2020  ? Dry eye 05/25/2020  ? Vitamin D deficiency 05/25/2020  ? Epistaxis, recurrent 01/05/2020  ? Ankle weakness 12/01/2019  ? Mixed hyperlipidemia 12/01/2019  ? Paresthesia of skin 05/27/2019  ? BMI 50.0-59.9, adult (HCC) 05/12/2019  ? Chronic fatigue 05/12/2019  ? Chronic left-sided low back pain without sciatica 12/02/2017  ? Interstitial cystitis (chronic) with hematuria 11/26/2017  ? 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 Primary hypertension 03/25/2017  ? Midline thoracic back pain 09/27/2015  ? Acne vulgaris 09/27/2015  ? Anorexia nervosa, binge-eating purging type 04/11/2015  ? Dysmenorrhea 02/27/2015  ? Sleep disturbance 11/18/2014  ? Attention deficit hyperactivity disorder (ADHD), combined type 08/24/2014  ? Adjustment disorder with mixed anxiety and depressed mood 08/23/2014  ? ? ?Current Outpatient Medications on File Prior to Visit  ?Medication Sig Dispense Refill  ? albuterol (VENTOLIN HFA) 108 (90 Base) MCG/ACT inhaler INHALE 2 PUFFS INTO  THE LUNGS EVERY 4 HOURS AS NEEDED FOR WHEEZE OR FOR SHORTNESS OF BREATH 18 each 1  ? clindamycin (CLEOCIN T) 1 % lotion     ? cyanocobalamin (,VITAMIN B-12,) 1000 MCG/ML injection INJECT 1 ML (1,000 MCG TOTAL) INTO THE MUSCLE 3 (THREE) TIMES A WEEK FOR 12 DOSES. 12 mL 0  ? cyclobenzaprine (FLEXERIL) 10 MG tablet Take 1 tablet (10 mg total) by mouth 3 (three) times daily as needed for muscle spasms. 30 tablet 0  ? DULoxetine (CYMBALTA) 60 MG capsule TAKE 1 CAPSULE BY MOUTH 2 TIMES DAILY. 180 capsule 1  ? lisinopril (ZESTRIL) 10 MG tablet TAKE 1 TABLET BY MOUTH EVERY DAY 90 tablet 1  ? methylphenidate 54 MG PO CR tablet Take 1 tablet (54 mg total) by mouth daily with breakfast. 30 tablet 0  ? mupirocin ointment (BACTROBAN) 2 % Apply 1 application topically 2 (two) times daily. 60 g 3  ? naproxen (NAPROSYN) 500 MG tablet TAKE 1 TABLET BY MOUTH TWICE A DAY AS NEEDED 60 tablet 1  ? omeprazole (PRILOSEC) 20 MG capsule TAKE 1 CAPSULE BY MOUTH EVERY DAY 90 capsule 0  ? predniSONE (DELTASONE) 10 MG tablet Take by mouth daily with food decrease dose daily: 6, 5, 4, 3, 2, then 1 tablet daily 21 tablet 0  ? [DISCONTINUED] pantoprazole (PROTONIX) 40 MG tablet TAKE 1 TABLET BY MOUTH EVERY DAY 90 tablet 1  ? ?No current facility-administered medications on file prior to visit.  ? ? ?Allergies  ?Allergen Reactions  ? Dust Mite Extract Other (See Comments)  ?  Sneezing, coughing, runny nose ?Sneezing, coughing, runny  nose ?  ? ? ?Physical Exam:  ?  ?Vitals:  ? 04/16/21 1555 04/16/21 1602  ?BP: (!) 157/91 124/84  ?Pulse: (!) 132 (!) 108  ?Weight: (!) 307 lb 12.8 oz (139.6 kg)   ?Height: 5' 3.3" (1.608 m)   ? ? ?Growth percentile SmartLinks can only be used for patients less than 89 years old. ? ?Physical Exam ?Vitals and nursing note reviewed.  ?Constitutional:   ?   General: She is not in acute distress. ?   Appearance: She is well-developed.  ?Neck:  ?   Thyroid: No thyromegaly.  ?Cardiovascular:  ?   Rate and Rhythm: Normal rate  and regular rhythm.  ?   Heart sounds: No murmur heard. ?Pulmonary:  ?   Breath sounds: Normal breath sounds.  ?Abdominal:  ?   Palpations: Abdomen is soft. There is no mass.  ?   Tenderness: There is no abdominal tenderness. There is no guarding.  ?Musculoskeletal:  ?   Right lower leg: No edema.  ?   Left lower leg: No edema.  ?Lymphadenopathy:  ?   Cervical: No cervical adenopathy.  ?Skin: ?   General: Skin is warm.  ?   Capillary Refill: Capillary refill takes less than 2 seconds.  ?   Findings: No rash.  ?Neurological:  ?   Mental Status: She is alert.  ?   Comments: No tremor  ?Psychiatric:  ?   Comments: Appears more upbeat than last visit but still reporting significant depression  ? ? ?Assessment/Plan: ?1. Recurrent major depression resistant to treatment [facility]) ?Recommended considering adjunct treatment with referral to psychiatry for options. She is going to talk to her therapist tomorrow about options and let me know thoughts. Provided information on medications as well as TMS, esketamine, etc. Also explored what it would look like to take a break from school and do some exploring. ? ?2. Social anxiety disorder ?Still struggles socially in public  ? ?3. B12 deficiency ?Injection given today  ? ?4. Attention deficit hyperactivity disorder (ADHD), combined type ?Despite some early side effects, she wishes to continue concerta at current dose  ? ?5. Essential hypertension ?Repeat BP in clinic ok.  ? ?Return pending decision on adjunct depression tx  ? ?Alfonso Ramus, FNP ? ? ? ?

## 2021-04-17 ENCOUNTER — Ambulatory Visit: Payer: BC Managed Care – PPO | Admitting: Internal Medicine

## 2021-04-17 DIAGNOSIS — F339 Major depressive disorder, recurrent, unspecified: Secondary | ICD-10-CM | POA: Insufficient documentation

## 2021-04-17 DIAGNOSIS — F331 Major depressive disorder, recurrent, moderate: Secondary | ICD-10-CM | POA: Diagnosis not present

## 2021-04-17 DIAGNOSIS — F439 Reaction to severe stress, unspecified: Secondary | ICD-10-CM | POA: Diagnosis not present

## 2021-04-17 DIAGNOSIS — F509 Eating disorder, unspecified: Secondary | ICD-10-CM | POA: Diagnosis not present

## 2021-04-17 DIAGNOSIS — I10 Essential (primary) hypertension: Secondary | ICD-10-CM | POA: Insufficient documentation

## 2021-04-23 ENCOUNTER — Other Ambulatory Visit: Payer: Self-pay | Admitting: Family

## 2021-04-23 DIAGNOSIS — K219 Gastro-esophageal reflux disease without esophagitis: Secondary | ICD-10-CM

## 2021-04-24 DIAGNOSIS — F439 Reaction to severe stress, unspecified: Secondary | ICD-10-CM | POA: Diagnosis not present

## 2021-04-24 DIAGNOSIS — F509 Eating disorder, unspecified: Secondary | ICD-10-CM | POA: Diagnosis not present

## 2021-04-24 DIAGNOSIS — F331 Major depressive disorder, recurrent, moderate: Secondary | ICD-10-CM | POA: Diagnosis not present

## 2021-04-24 NOTE — Progress Notes (Deleted)
? ?Office Visit Note ? ?Patient: Emily [name]             ?Date of Birth: 06-13-2000           ?MRN: [id number]             ?PCP: Burnard Bunting, MD ?Referring: Burnard Bunting, MD ?Visit Date: 04/25/2021 ? ? ?Subjective:  ?No chief complaint on file. ? ? ?History of Present Illness: Emily [name] is a 21 y.o. female here for follow up for chronic back ain with elevated inflammatory markers. Labs showing negative HLA-B27 Abs after last visit checked with xray findings suggestive for sacroiliitis. Short term prednisone taper was prescribed for back pain along with existing treatments duloxetine, naproxen, and flexeril. ***  ? ?Previous HPI ?02/13/21 ?Emily [name] is a 21 y.o. female here for follow up for evaluation of chronic back pain and multiple other symptoms findings at last visit with persistent ESR and CRP elevations other tests all negative. She continues to have back pain worst symptoms are lying in bed at night. She also has increased back pain first thing in the morning. ?  ?Previous HPI ?01/17/21 ?Emily [name] is a 21 y.o. female here for evaluation of fatigue, joint pains, dry eyes with elevated ESR and CRP.  She has a chronic history of multiple symptoms going back to at least about 7 years ago.  At that time she had eating disorder with some problems attributed to malnutrition and this condition but have persisted despite resolution of her eating disorder problems.  Mostly with a lot of fatigue, intermittent paresthesias and sensitivity, insomnia, skin rashes, and body pains especially in the back and upper arms.  She feels these have overall just progressed with time and now feels like muscle and joint pains are frequently limiting activities.  She does not usually notice any joint swelling warmth or erythema.  Worst affected areas are in the upper and lower back bilaterally.  She is done some work with physical therapy without a long persistent benefit to symptoms. She  takes Cymbalta primarily for anxiety and depression symptoms.  She takes naproxen as needed which is also partially helpful for joint and muscle pains. ?She has taken Flexeril in the past for muscular pain and trazodone for insomnia but feels this medicine did not do well with her.  She had previous sleep study that was negative for significant sleep apnea. ?Skin rashes on the face have been described as probably rosacea she has flushing with stress and with alcohol.  She maintains chronic mild rashes on the arms sometimes eczema type symptoms.  She is frequently heat intolerance and feels hot with some skin flushing.  Denies discoloration in hands and feet such as pallor or cyanosis. ?Dry eye symptoms are a major complaint with some worsening over time for which she was concerned of possible sjogren's syndrome but negative serology to date. She uses eye drops for this currently and has discussed possible going to restasis treatment with her eye doctor. She does not have as much problem with mouth dryness, although drinks water very frequently. ?She is also had chronic problems with interstitial cystitis apparently some structural abnormality with the ureters and decreased bladder volume.  Previous work-up including contrast CT study of the abdomen with some possible cystic changes but not thought to represent polycystic kidney disease.  Urinalysis has shown persistent proteinuria usually without other findings and renal function remains normal. ?  ?Labs reviewed ?11/2020 ?ANA neg ?dsDNA, SSA, SSB  neg ?RF neg ?CCP neg ?ESR 41, 43 ?CRP 24.8, 35.5 ?CBC WBC 11.6 Plts 493 ?CMP wnl ?TSH 2.64 ?UA +protein ?  ?05/2020 ?Ferritin 7 ?Vit D 21 ? ? ?No Rheumatology ROS completed.  ? ?PMFS History:  ?Patient Active Problem List  ? Diagnosis Date Noted  ? Recurrent major depression resistant to treatment ([place]) 04/17/2021  ? Essential hypertension 04/17/2021  ? Acne rosacea 01/17/2021  ? Elevated C-reactive protein (CRP) 12/11/2020   ? Elevated sed rate 12/11/2020  ? Arthralgia 11/27/2020  ? Social anxiety disorder 08/02/2020  ? B12 deficiency 06/05/2020  ? Dry eye 05/25/2020  ? Vitamin D deficiency 05/25/2020  ? Epistaxis, recurrent 01/05/2020  ? Ankle weakness 12/01/2019  ? Mixed hyperlipidemia 12/01/2019  ? Paresthesia of skin 05/27/2019  ? BMI 50.0-59.9, adult ([place]) 05/12/2019  ? Chronic fatigue 05/12/2019  ? Chronic left-sided low back pain without sciatica 12/02/2017  ? Interstitial cystitis (chronic) with hematuria 11/26/2017  ? Partially duplicated ureter 02/77/4128  ? Primary hypertension 03/25/2017  ? Midline thoracic back pain 09/27/2015  ? Acne vulgaris 09/27/2015  ? Anorexia nervosa, binge-eating purging type 04/11/2015  ? Dysmenorrhea 02/27/2015  ? Sleep disturbance 11/18/2014  ? Attention deficit hyperactivity disorder (ADHD), combined type 08/24/2014  ? Adjustment disorder with mixed anxiety and depressed mood 08/23/2014  ?  ?Past Medical History:  ?Diagnosis Date  ? ADHD (attention deficit hyperactivity disorder)   ? Anorexia   ? Anxiety   ? Asthma   ? B12 deficiency   ? Bulimia   ? Depression   ? Morbid obesity ([place])   ?  ?Family History  ?Problem Relation Age of Onset  ? Anxiety disorder Mother   ? Drug abuse Father   ? Hyperlipidemia Maternal Grandmother   ? Hypertension Maternal Grandmother   ? Hyperlipidemia Maternal Grandfather   ? Hypertension Maternal Grandfather   ? Colon cancer Neg Hx   ? Pancreatic cancer Neg Hx   ? Esophageal cancer Neg Hx   ? ?Past Surgical History:  ?Procedure Laterality Date  ? ADENOIDECTOMY    ? BLADDER REPAIR    ? LIGAMENT REPAIR    ? TONSILLECTOMY    ? ?Social History  ? ?Social History Narrative  ? Not on file  ? ?Immunization History  ?Administered Date(s) Administered  ? DTaP 12/05/1998, 02/01/1999, 04/15/1999, 12/31/1999, 12/20/2002  ? HPV 9-valent 01/01/2010, 04/04/2010, 08/07/2010  ? HPV Quadrivalent 01/01/2010, 04/04/2010, 08/07/2010  ? Hepatitis A, Adult 12/23/2004, 10/13/2005  ?  Hepatitis A, Ped/Adol-2 Dose 12/23/2004, 10/13/2005  ? Hepatitis B, ped/adol 07[birth year]28[birth year]98, 10/30/1998, 07/01/1999  ? HiB (PRP-OMP) 12/05/1998, 02/01/1999, 04/15/1999, 12/31/1999  ? HiB (PRP-T) 12/05/1998, 02/01/1999, 04/15/1999, 12/31/1999  ? IPV 12/05/1998, 02/01/1999, 04/15/1999, 12/20/2002  ? Influenza Split 11/10/2011, 11/12/2012, 11/09/2013, 10/14/2018  ? Influenza,inj,Quad PF,6+ Mos 10/14/2018  ? Influenza,inj,Quad PF,6-35 Mos 11/25/2016  ? Influenza,inj,quad, With Preservative 10/03/2014  ? Influenza-Unspecified 11/25/2016  ? MMR 09/30/1999, 12/20/2002  ? Meningococcal B, OMV 11/25/2016, 01/29/2017  ? Meningococcal Conjugate 01/01/2010, 10/03/2014  ? Meningococcal Mcv4o 01/01/2010, 10/03/2014  ? Moderna Sars-Covid-2 Vaccination 02/14/2019, 04/03/2019  ? Pneumococcal Polysaccharide-23 12/05/1998, 02/01/1999, 04/15/1999, 12/31/1999  ? Pneumococcal-Unspecified 12/05/1998, 02/01/1999, 04/15/1999, 12/31/1999  ? Td 05/21/2009, 07/28/2019  ? Tdap 05/21/2009  ? Varicella 09/30/1999, 12/23/2004  ?  ? ?Objective: ?Vital Signs: There were no vitals taken for this visit.  ? ?Physical Exam  ? ?Musculoskeletal Exam: *** ? ?CDAI Exam: ?CDAI Score: -- ?Patient Global: --; Provider Global: -- ?Swollen: --; Tender: -- ?Joint Exam 04/25/2021  ? ?No joint exam has been  documented for this visit  ? ?There is currently no information documented on the homunculus. Go to the Rheumatology activity and complete the homunculus joint exam. ? ?Investigation: ?No additional findings. ? ?Imaging: ?No results found. ? ?Recent Labs: ?Lab Results  ?Component Value Date  ? WBC 11.6 (H) 11/27/2020  ? HGB 12.1 11/27/2020  ? PLT 493 (H) 11/27/2020  ? NA 138 11/27/2020  ? K 4.4 11/27/2020  ? CL 105 11/27/2020  ? CO2 22 11/27/2020  ? GLUCOSE 118 (H) 11/27/2020  ? BUN 15 11/27/2020  ? CREATININE 0.69 11/27/2020  ? BILITOT 0.3 11/27/2020  ? ALKPHOS 24 (L) 04/21/2016  ? AST 13 11/27/2020  ? ALT 11 11/27/2020  ? PROT 6.8 11/27/2020  ? ALBUMIN 3.8  04/21/2016  ? CALCIUM 9.3 11/27/2020  ? ? ?Speciality Comments: No specialty comments available. ? ?Procedures:  ?No procedures performed ?Allergies: Dust mite extract  ? ?Assessment / Plan:     ?Visit Diagnoses:

## 2021-04-25 ENCOUNTER — Ambulatory Visit: Payer: BC Managed Care – PPO | Admitting: Internal Medicine

## 2021-04-26 ENCOUNTER — Ambulatory Visit: Payer: BC Managed Care – PPO | Admitting: Internal Medicine

## 2021-04-26 NOTE — Progress Notes (Deleted)
? ?Office Visit Note ? ?Patient: Emily [name]             ?Date of Birth: 2000-12-21           ?MRN: [id number]             ?PCP: Burnard Bunting, MD ?Referring: Burnard Bunting, MD ?Visit Date: 04/26/2021 ? ? ?Subjective:  ?No chief complaint on file. ? ? ?History of Present Illness: Emily [name] is a 21 y.o. female here for follow up for chronic back pain and inflammatory markers. Xray findings with sacroiliitis taking naproxen and duloxetine for symptoms. HLA-B27 allele was negative. ***  ? ?Previous HPI ?02/13/21 ?Emily [name] is a 21 y.o. female here for follow up for evaluation of chronic back pain and multiple other symptoms findings at last visit with persistent ESR and CRP elevations other tests all negative. She continues to have back pain worst symptoms are lying in bed at night. She also has increased back pain first thing in the morning. ?  ?Previous HPI ?01/17/21 ?Emily [name] is a 21 y.o. female here for evaluation of fatigue, joint pains, dry eyes with elevated ESR and CRP.  She has a chronic history of multiple symptoms going back to at least about 7 years ago.  At that time she had eating disorder with some problems attributed to malnutrition and this condition but have persisted despite resolution of her eating disorder problems.  Mostly with a lot of fatigue, intermittent paresthesias and sensitivity, insomnia, skin rashes, and body pains especially in the back and upper arms.  She feels these have overall just progressed with time and now feels like muscle and joint pains are frequently limiting activities.  She does not usually notice any joint swelling warmth or erythema.  Worst affected areas are in the upper and lower back bilaterally.  She is done some work with physical therapy without a long persistent benefit to symptoms. She takes Cymbalta primarily for anxiety and depression symptoms.  She takes naproxen as needed which is also partially helpful for joint  and muscle pains. ?She has taken Flexeril in the past for muscular pain and trazodone for insomnia but feels this medicine did not do well with her.  She had previous sleep study that was negative for significant sleep apnea. ?Skin rashes on the face have been described as probably rosacea she has flushing with stress and with alcohol.  She maintains chronic mild rashes on the arms sometimes eczema type symptoms.  She is frequently heat intolerance and feels hot with some skin flushing.  Denies discoloration in hands and feet such as pallor or cyanosis. ?Dry eye symptoms are a major complaint with some worsening over time for which she was concerned of possible sjogren's syndrome but negative serology to date. She uses eye drops for this currently and has discussed possible going to restasis treatment with her eye doctor. She does not have as much problem with mouth dryness, although drinks water very frequently. ?She is also had chronic problems with interstitial cystitis apparently some structural abnormality with the ureters and decreased bladder volume.  Previous work-up including contrast CT study of the abdomen with some possible cystic changes but not thought to represent polycystic kidney disease.  Urinalysis has shown persistent proteinuria usually without other findings and renal function remains normal. ?  ?Labs reviewed ?11/2020 ?ANA neg ?dsDNA, SSA, SSB neg ?RF neg ?CCP neg ?ESR 41, 43 ?CRP 24.8, 35.5 ?CBC WBC 11.6 Plts 493 ?CMP wnl ?TSH  2.64 ?UA +protein ?  ?05/2020 ?Ferritin 7 ?Vit D 21 ? ? ?No Rheumatology ROS completed.  ? ?PMFS History:  ?Patient Active Problem List  ? Diagnosis Date Noted  ? Recurrent major depression resistant to treatment ([place]) 04/17/2021  ? Essential hypertension 04/17/2021  ? Acne rosacea 01/17/2021  ? Elevated C-reactive protein (CRP) 12/11/2020  ? Elevated sed rate 12/11/2020  ? Arthralgia 11/27/2020  ? Social anxiety disorder 08/02/2020  ? B12 deficiency 06/05/2020  ? Dry  eye 05/25/2020  ? Vitamin D deficiency 05/25/2020  ? Epistaxis, recurrent 01/05/2020  ? Ankle weakness 12/01/2019  ? Mixed hyperlipidemia 12/01/2019  ? Paresthesia of skin 05/27/2019  ? BMI 50.0-59.9, adult ([place]) 05/12/2019  ? Chronic fatigue 05/12/2019  ? Chronic left-sided low back pain without sciatica 12/02/2017  ? Interstitial cystitis (chronic) with hematuria 11/26/2017  ? Partially duplicated ureter 96/04/5407  ? Primary hypertension 03/25/2017  ? Midline thoracic back pain 09/27/2015  ? Acne vulgaris 09/27/2015  ? Anorexia nervosa, binge-eating purging type 04/11/2015  ? Dysmenorrhea 02/27/2015  ? Sleep disturbance 11/18/2014  ? Attention deficit hyperactivity disorder (ADHD), combined type 08/24/2014  ? Adjustment disorder with mixed anxiety and depressed mood 08/23/2014  ?  ?Past Medical History:  ?Diagnosis Date  ? ADHD (attention deficit hyperactivity disorder)   ? Anorexia   ? Anxiety   ? Asthma   ? B12 deficiency   ? Bulimia   ? Depression   ? Morbid obesity ([place])   ?  ?Family History  ?Problem Relation Age of Onset  ? Anxiety disorder Mother   ? Drug abuse Father   ? Hyperlipidemia Maternal Grandmother   ? Hypertension Maternal Grandmother   ? Hyperlipidemia Maternal Grandfather   ? Hypertension Maternal Grandfather   ? Colon cancer Neg Hx   ? Pancreatic cancer Neg Hx   ? Esophageal cancer Neg Hx   ? ?Past Surgical History:  ?Procedure Laterality Date  ? ADENOIDECTOMY    ? BLADDER REPAIR    ? LIGAMENT REPAIR    ? TONSILLECTOMY    ? ?Social History  ? ?Social History Narrative  ? Not on file  ? ?Immunization History  ?Administered Date(s) Administered  ? DTaP 12/05/1998, 02/01/1999, 04/15/1999, 12/31/1999, 12/20/2002  ? HPV 9-valent 01/01/2010, 04/04/2010, 08/07/2010  ? HPV Quadrivalent 01/01/2010, 04/04/2010, 08/07/2010  ? Hepatitis A, Adult 12/23/2004, 10/13/2005  ? Hepatitis A, Ped/Adol-2 Dose 12/23/2004, 10/13/2005  ? Hepatitis B, ped/adol 05-29-98, 10/30/1998, 07/01/1999  ? HiB (PRP-OMP)  12/05/1998, 02/01/1999, 04/15/1999, 12/31/1999  ? HiB (PRP-T) 12/05/1998, 02/01/1999, 04/15/1999, 12/31/1999  ? IPV 12/05/1998, 02/01/1999, 04/15/1999, 12/20/2002  ? Influenza Split 11/10/2011, 11/12/2012, 11/09/2013, 10/14/2018  ? Influenza,inj,Quad PF,6+ Mos 10/14/2018  ? Influenza,inj,Quad PF,6-35 Mos 11/25/2016  ? Influenza,inj,quad, With Preservative 10/03/2014  ? Influenza-Unspecified 11/25/2016  ? MMR 09/30/1999, 12/20/2002  ? Meningococcal B, OMV 11/25/2016, 01/29/2017  ? Meningococcal Conjugate 01/01/2010, 10/03/2014  ? Meningococcal Mcv4o 01/01/2010, 10/03/2014  ? Moderna Sars-Covid-2 Vaccination 02/14/2019, 04/03/2019  ? Pneumococcal Polysaccharide-23 12/05/1998, 02/01/1999, 04/15/1999, 12/31/1999  ? Pneumococcal-Unspecified 12/05/1998, 02/01/1999, 04/15/1999, 12/31/1999  ? Td 05/21/2009, 07/28/2019  ? Tdap 05/21/2009  ? Varicella 09/30/1999, 12/23/2004  ?  ? ?Objective: ?Vital Signs: There were no vitals taken for this visit.  ? ?Physical Exam  ? ?Musculoskeletal Exam: *** ? ?CDAI Exam: ?CDAI Score: -- ?Patient Global: --; Provider Global: -- ?Swollen: --; Tender: -- ?Joint Exam 04/26/2021  ? ?No joint exam has been documented for this visit  ? ?There is currently no information documented on the homunculus. Go to the Rheumatology  activity and complete the homunculus joint exam. ? ?Investigation: ?No additional findings. ? ?Imaging: ?No results found. ? ?Recent Labs: ?Lab Results  ?Component Value Date  ? WBC 11.6 (H) 11/27/2020  ? HGB 12.1 11/27/2020  ? PLT 493 (H) 11/27/2020  ? NA 138 11/27/2020  ? K 4.4 11/27/2020  ? CL 105 11/27/2020  ? CO2 22 11/27/2020  ? GLUCOSE 118 (H) 11/27/2020  ? BUN 15 11/27/2020  ? CREATININE 0.69 11/27/2020  ? BILITOT 0.3 11/27/2020  ? ALKPHOS 24 (L) 04/21/2016  ? AST 13 11/27/2020  ? ALT 11 11/27/2020  ? PROT 6.8 11/27/2020  ? ALBUMIN 3.8 04/21/2016  ? CALCIUM 9.3 11/27/2020  ? ? ?Speciality Comments: No specialty comments available. ? ?Procedures:  ?No procedures  performed ?Allergies: Dust mite extract  ? ?Assessment / Plan:     ?Visit Diagnoses: No diagnosis found. ? ?*** ? ?Orders: ?No orders of the defined types were placed in this encounter. ? ?No orders of the defined type

## 2021-05-01 DIAGNOSIS — F509 Eating disorder, unspecified: Secondary | ICD-10-CM | POA: Diagnosis not present

## 2021-05-01 DIAGNOSIS — F439 Reaction to severe stress, unspecified: Secondary | ICD-10-CM | POA: Diagnosis not present

## 2021-05-01 DIAGNOSIS — F331 Major depressive disorder, recurrent, moderate: Secondary | ICD-10-CM | POA: Diagnosis not present

## 2021-05-08 DIAGNOSIS — F331 Major depressive disorder, recurrent, moderate: Secondary | ICD-10-CM | POA: Diagnosis not present

## 2021-05-08 DIAGNOSIS — F439 Reaction to severe stress, unspecified: Secondary | ICD-10-CM | POA: Diagnosis not present

## 2021-05-08 DIAGNOSIS — F509 Eating disorder, unspecified: Secondary | ICD-10-CM | POA: Diagnosis not present

## 2021-05-14 DIAGNOSIS — F411 Generalized anxiety disorder: Secondary | ICD-10-CM | POA: Diagnosis not present

## 2021-05-14 DIAGNOSIS — F4010 Social phobia, unspecified: Secondary | ICD-10-CM | POA: Diagnosis not present

## 2021-05-14 DIAGNOSIS — F341 Dysthymic disorder: Secondary | ICD-10-CM | POA: Diagnosis not present

## 2021-05-15 DIAGNOSIS — F509 Eating disorder, unspecified: Secondary | ICD-10-CM | POA: Diagnosis not present

## 2021-05-15 DIAGNOSIS — F331 Major depressive disorder, recurrent, moderate: Secondary | ICD-10-CM | POA: Diagnosis not present

## 2021-05-15 DIAGNOSIS — F439 Reaction to severe stress, unspecified: Secondary | ICD-10-CM | POA: Diagnosis not present

## 2021-05-22 DIAGNOSIS — F509 Eating disorder, unspecified: Secondary | ICD-10-CM | POA: Diagnosis not present

## 2021-05-22 DIAGNOSIS — F439 Reaction to severe stress, unspecified: Secondary | ICD-10-CM | POA: Diagnosis not present

## 2021-05-22 DIAGNOSIS — F331 Major depressive disorder, recurrent, moderate: Secondary | ICD-10-CM | POA: Diagnosis not present

## 2021-05-24 ENCOUNTER — Other Ambulatory Visit: Payer: Self-pay | Admitting: Pediatrics

## 2021-05-24 DIAGNOSIS — N946 Dysmenorrhea, unspecified: Secondary | ICD-10-CM

## 2021-05-29 DIAGNOSIS — F411 Generalized anxiety disorder: Secondary | ICD-10-CM | POA: Diagnosis not present

## 2021-05-29 DIAGNOSIS — F509 Eating disorder, unspecified: Secondary | ICD-10-CM | POA: Diagnosis not present

## 2021-05-29 DIAGNOSIS — F439 Reaction to severe stress, unspecified: Secondary | ICD-10-CM | POA: Diagnosis not present

## 2021-05-29 DIAGNOSIS — F4010 Social phobia, unspecified: Secondary | ICD-10-CM | POA: Diagnosis not present

## 2021-05-29 DIAGNOSIS — F341 Dysthymic disorder: Secondary | ICD-10-CM | POA: Diagnosis not present

## 2021-05-29 DIAGNOSIS — F331 Major depressive disorder, recurrent, moderate: Secondary | ICD-10-CM | POA: Diagnosis not present

## 2021-05-30 ENCOUNTER — Other Ambulatory Visit: Payer: Self-pay | Admitting: Pediatrics

## 2021-05-30 DIAGNOSIS — E611 Iron deficiency: Secondary | ICD-10-CM

## 2021-06-12 DIAGNOSIS — F439 Reaction to severe stress, unspecified: Secondary | ICD-10-CM | POA: Diagnosis not present

## 2021-06-12 DIAGNOSIS — F4010 Social phobia, unspecified: Secondary | ICD-10-CM | POA: Diagnosis not present

## 2021-06-12 DIAGNOSIS — F411 Generalized anxiety disorder: Secondary | ICD-10-CM | POA: Diagnosis not present

## 2021-06-12 DIAGNOSIS — F341 Dysthymic disorder: Secondary | ICD-10-CM | POA: Diagnosis not present

## 2021-06-12 DIAGNOSIS — F331 Major depressive disorder, recurrent, moderate: Secondary | ICD-10-CM | POA: Diagnosis not present

## 2021-06-12 DIAGNOSIS — F509 Eating disorder, unspecified: Secondary | ICD-10-CM | POA: Diagnosis not present

## 2021-06-19 DIAGNOSIS — F509 Eating disorder, unspecified: Secondary | ICD-10-CM | POA: Diagnosis not present

## 2021-06-19 DIAGNOSIS — F439 Reaction to severe stress, unspecified: Secondary | ICD-10-CM | POA: Diagnosis not present

## 2021-06-19 DIAGNOSIS — F331 Major depressive disorder, recurrent, moderate: Secondary | ICD-10-CM | POA: Diagnosis not present

## 2021-06-26 DIAGNOSIS — F439 Reaction to severe stress, unspecified: Secondary | ICD-10-CM | POA: Diagnosis not present

## 2021-06-26 DIAGNOSIS — F331 Major depressive disorder, recurrent, moderate: Secondary | ICD-10-CM | POA: Diagnosis not present

## 2021-06-26 DIAGNOSIS — F509 Eating disorder, unspecified: Secondary | ICD-10-CM | POA: Diagnosis not present

## 2021-07-03 DIAGNOSIS — F439 Reaction to severe stress, unspecified: Secondary | ICD-10-CM | POA: Diagnosis not present

## 2021-07-03 DIAGNOSIS — F509 Eating disorder, unspecified: Secondary | ICD-10-CM | POA: Diagnosis not present

## 2021-07-03 DIAGNOSIS — F331 Major depressive disorder, recurrent, moderate: Secondary | ICD-10-CM | POA: Diagnosis not present

## 2021-07-09 ENCOUNTER — Ambulatory Visit: Payer: BC Managed Care – PPO | Admitting: Pediatrics

## 2021-07-11 DIAGNOSIS — F341 Dysthymic disorder: Secondary | ICD-10-CM | POA: Diagnosis not present

## 2021-07-11 DIAGNOSIS — F411 Generalized anxiety disorder: Secondary | ICD-10-CM | POA: Diagnosis not present

## 2021-07-11 DIAGNOSIS — F4010 Social phobia, unspecified: Secondary | ICD-10-CM | POA: Diagnosis not present

## 2021-07-17 DIAGNOSIS — F509 Eating disorder, unspecified: Secondary | ICD-10-CM | POA: Diagnosis not present

## 2021-07-17 DIAGNOSIS — F331 Major depressive disorder, recurrent, moderate: Secondary | ICD-10-CM | POA: Diagnosis not present

## 2021-07-17 DIAGNOSIS — F439 Reaction to severe stress, unspecified: Secondary | ICD-10-CM | POA: Diagnosis not present

## 2021-07-23 DIAGNOSIS — F4010 Social phobia, unspecified: Secondary | ICD-10-CM | POA: Diagnosis not present

## 2021-07-23 DIAGNOSIS — F341 Dysthymic disorder: Secondary | ICD-10-CM | POA: Diagnosis not present

## 2021-07-23 DIAGNOSIS — F411 Generalized anxiety disorder: Secondary | ICD-10-CM | POA: Diagnosis not present

## 2021-07-24 DIAGNOSIS — F439 Reaction to severe stress, unspecified: Secondary | ICD-10-CM | POA: Diagnosis not present

## 2021-07-24 DIAGNOSIS — F509 Eating disorder, unspecified: Secondary | ICD-10-CM | POA: Diagnosis not present

## 2021-07-24 DIAGNOSIS — F331 Major depressive disorder, recurrent, moderate: Secondary | ICD-10-CM | POA: Diagnosis not present

## 2021-07-26 ENCOUNTER — Other Ambulatory Visit: Payer: Self-pay | Admitting: Pediatrics

## 2021-07-26 DIAGNOSIS — K219 Gastro-esophageal reflux disease without esophagitis: Secondary | ICD-10-CM

## 2021-07-31 DIAGNOSIS — F439 Reaction to severe stress, unspecified: Secondary | ICD-10-CM | POA: Diagnosis not present

## 2021-07-31 DIAGNOSIS — F341 Dysthymic disorder: Secondary | ICD-10-CM | POA: Diagnosis not present

## 2021-07-31 DIAGNOSIS — F509 Eating disorder, unspecified: Secondary | ICD-10-CM | POA: Diagnosis not present

## 2021-07-31 DIAGNOSIS — F331 Major depressive disorder, recurrent, moderate: Secondary | ICD-10-CM | POA: Diagnosis not present

## 2021-08-01 DIAGNOSIS — F4010 Social phobia, unspecified: Secondary | ICD-10-CM | POA: Diagnosis not present

## 2021-08-01 DIAGNOSIS — F341 Dysthymic disorder: Secondary | ICD-10-CM | POA: Diagnosis not present

## 2021-08-01 DIAGNOSIS — F411 Generalized anxiety disorder: Secondary | ICD-10-CM | POA: Diagnosis not present

## 2021-08-07 DIAGNOSIS — F331 Major depressive disorder, recurrent, moderate: Secondary | ICD-10-CM | POA: Diagnosis not present

## 2021-08-07 DIAGNOSIS — F439 Reaction to severe stress, unspecified: Secondary | ICD-10-CM | POA: Diagnosis not present

## 2021-08-07 DIAGNOSIS — F509 Eating disorder, unspecified: Secondary | ICD-10-CM | POA: Diagnosis not present

## 2021-08-09 DIAGNOSIS — F4010 Social phobia, unspecified: Secondary | ICD-10-CM | POA: Diagnosis not present

## 2021-08-09 DIAGNOSIS — F411 Generalized anxiety disorder: Secondary | ICD-10-CM | POA: Diagnosis not present

## 2021-08-09 DIAGNOSIS — F341 Dysthymic disorder: Secondary | ICD-10-CM | POA: Diagnosis not present

## 2021-08-12 ENCOUNTER — Ambulatory Visit: Payer: BC Managed Care – PPO | Admitting: Pediatrics

## 2021-08-12 VITALS — BP 135/79 | HR 114 | Ht 64.0 in | Wt 303.6 lb

## 2021-08-12 DIAGNOSIS — M545 Low back pain, unspecified: Secondary | ICD-10-CM

## 2021-08-12 DIAGNOSIS — F339 Major depressive disorder, recurrent, unspecified: Secondary | ICD-10-CM

## 2021-08-12 DIAGNOSIS — F902 Attention-deficit hyperactivity disorder, combined type: Secondary | ICD-10-CM | POA: Diagnosis not present

## 2021-08-12 DIAGNOSIS — K219 Gastro-esophageal reflux disease without esophagitis: Secondary | ICD-10-CM

## 2021-08-12 DIAGNOSIS — I10 Essential (primary) hypertension: Secondary | ICD-10-CM

## 2021-08-12 DIAGNOSIS — F4010 Social phobia, unspecified: Secondary | ICD-10-CM | POA: Diagnosis not present

## 2021-08-12 DIAGNOSIS — F331 Major depressive disorder, recurrent, moderate: Secondary | ICD-10-CM | POA: Diagnosis not present

## 2021-08-12 DIAGNOSIS — F50029 Anorexia nervosa, binge eating/purging type, unspecified: Secondary | ICD-10-CM

## 2021-08-12 DIAGNOSIS — F5002 Anorexia nervosa, binge eating/purging type: Secondary | ICD-10-CM

## 2021-08-12 DIAGNOSIS — G8929 Other chronic pain: Secondary | ICD-10-CM

## 2021-08-12 MED ORDER — OMEPRAZOLE 20 MG PO CPDR: 20.0000 mg | DELAYED_RELEASE_CAPSULE | Freq: Every day | ORAL | 1 refills | Status: DC

## 2021-08-12 MED ORDER — PREDNISONE 10 MG PO TABS: ORAL_TABLET | ORAL | 0 refills | Status: DC

## 2021-08-12 MED ORDER — LISINOPRIL 10 MG PO TABS: 10.0000 mg | ORAL_TABLET | Freq: Every day | ORAL | 1 refills | Status: DC

## 2021-08-12 NOTE — Progress Notes (Unsigned)
History was provided by the {[email or web address]:[id number]}.  Emily [name] is a 21 y.o. female who is here for ***.  Geoffry Paradise, MD   HPI:  Pt reports she need a new primary care provider since I am leaving.   She is going to [facility] and seeing psychiatry there. She is on seroquel in addition to cymbalta. She also started TMS last week but went "horribly wrong." Says it was too much too fast but had her first real treatment today with someone who was very reassuring about it.   She is purging and eating once a day but says her body is so used to that it doesn't even feel like an eating disorder. Not purging daily. Not able to purge as much volume as she used to be able to. Has been about 4 days since last.  Went and stayed at [facility] for a week after she was too fed up at home. Still seeing therapist every week.   Going to see rheum in August- has been having more pain and fatigue. Continues to have a lot of brain fog.    No LMP recorded. (Menstrual status: Oral contraceptives).  ROS  Patient Active Problem List   Diagnosis Date Noted   Recurrent major depression resistant to treatment ([facility]) 04/17/2021   Essential hypertension 04/17/2021   Acne rosacea 01/17/2021   Elevated C-reactive protein (CRP) 12/11/2020   Elevated sed rate 12/11/2020   Arthralgia 11/27/2020   Social anxiety disorder 08/02/2020   B12 deficiency 06/05/2020   Dry eye 05/25/2020   Vitamin D deficiency 05/25/2020   Epistaxis, recurrent 01/05/2020   Ankle weakness 12/01/2019   Mixed hyperlipidemia 12/01/2019   Paresthesia of skin 05/27/2019   BMI 50.0-59.9, adult ([facility]) 05/12/2019   Chronic fatigue 05/12/2019   Chronic left-sided low back pain without sciatica 12/02/2017   Interstitial cystitis (chronic) with hematuria 11/26/2017   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   Primary hypertension 03/25/2017   Midline thoracic back pain 09/27/2015   Acne vulgaris 09/27/2015   Anorexia nervosa,  binge-eating purging type 04/11/2015   Dysmenorrhea 02/27/2015   Sleep disturbance 11/18/2014   Attention deficit hyperactivity disorder (ADHD), combined type 08/24/2014   Adjustment disorder with mixed anxiety and depressed mood 08/23/2014    Current Outpatient Medications on File Prior to Visit  Medication Sig Dispense Refill   albuterol (VENTOLIN HFA) 108 (90 Base) MCG/ACT inhaler INHALE 2 PUFFS INTO THE LUNGS EVERY 4 HOURS AS NEEDED FOR WHEEZE OR FOR SHORTNESS OF BREATH 18 each 1   clindamycin (CLEOCIN T) 1 % lotion      cyclobenzaprine (FLEXERIL) 10 MG tablet Take 1 tablet (10 mg total) by mouth 3 (three) times daily as needed for muscle spasms. 30 tablet 0   DULoxetine (CYMBALTA) 60 MG capsule TAKE 1 CAPSULE BY MOUTH 2 TIMES DAILY. 180 capsule 1   lisinopril (ZESTRIL) 10 MG tablet TAKE 1 TABLET BY MOUTH EVERY DAY 90 tablet 1   mupirocin ointment (BACTROBAN) 2 % Apply 1 application topically 2 (two) times daily. 60 g 3   naproxen (NAPROSYN) 500 MG tablet TAKE 1 TABLET BY MOUTH TWICE A DAY AS NEEDED 60 tablet 1   omeprazole (PRILOSEC) 20 MG capsule TAKE 1 CAPSULE BY MOUTH EVERY DAY 90 capsule 0   QUEtiapine (SEROQUEL) 25 MG tablet Take 25 mg by mouth at bedtime.     QUEtiapine (SEROQUEL) 50 MG tablet Take 50 mg by mouth at bedtime.     predniSONE (DELTASONE) 10 MG tablet Take by  mouth daily with food decrease dose daily: 6, 5, 4, 3, 2, then 1 tablet daily (Patient not taking: Reported on 08/12/2021) 21 tablet 0   [DISCONTINUED] pantoprazole (PROTONIX) 40 MG tablet TAKE 1 TABLET BY MOUTH EVERY DAY 90 tablet 1   No current facility-administered medications on file prior to visit.    Allergies  Allergen Reactions   Dust Mite Extract Other (See Comments)    Sneezing, coughing, runny nose Sneezing, coughing, runny nose     Social History: Confidentiality was discussed with the patient and if applicable, with caregiver as well. Tobacco: *** Secondhand smoke exposure?  {yes***/no:17258} Drugs/EtOH: *** Sexually active? {yes***/no:17258}  Safety: *** Last STI Screening:*** Pregnancy Prevention: ***  Physical Exam:    Vitals:   08/12/21 1632  BP: 135/79  Pulse: (!) 114  Weight: (!) 303 lb 9.6 oz (137.7 kg)  Height: 5\' 4"  (1.626 m)    Growth [employer] SmartLinks can only be used for patients less than 36 years old.  Physical Exam  Assessment/Plan: ***

## 2021-08-12 NOTE — Patient Instructions (Addendum)
[facility]   Give a call to [facility] to get established with new primary care  [address] [place], [place] [address] Hours:  Closed ? Opens 7?AM Tue Phone: [phone]

## 2021-08-13 DIAGNOSIS — F331 Major depressive disorder, recurrent, moderate: Secondary | ICD-10-CM | POA: Diagnosis not present

## 2021-08-14 DIAGNOSIS — F439 Reaction to severe stress, unspecified: Secondary | ICD-10-CM | POA: Diagnosis not present

## 2021-08-14 DIAGNOSIS — F331 Major depressive disorder, recurrent, moderate: Secondary | ICD-10-CM | POA: Diagnosis not present

## 2021-08-14 DIAGNOSIS — F509 Eating disorder, unspecified: Secondary | ICD-10-CM | POA: Diagnosis not present

## 2021-08-15 DIAGNOSIS — F331 Major depressive disorder, recurrent, moderate: Secondary | ICD-10-CM | POA: Diagnosis not present

## 2021-08-16 DIAGNOSIS — F331 Major depressive disorder, recurrent, moderate: Secondary | ICD-10-CM | POA: Diagnosis not present

## 2021-08-19 DIAGNOSIS — F331 Major depressive disorder, recurrent, moderate: Secondary | ICD-10-CM | POA: Diagnosis not present

## 2021-08-20 DIAGNOSIS — F331 Major depressive disorder, recurrent, moderate: Secondary | ICD-10-CM | POA: Diagnosis not present

## 2021-08-21 DIAGNOSIS — F331 Major depressive disorder, recurrent, moderate: Secondary | ICD-10-CM | POA: Diagnosis not present

## 2021-08-21 DIAGNOSIS — F509 Eating disorder, unspecified: Secondary | ICD-10-CM | POA: Diagnosis not present

## 2021-08-21 DIAGNOSIS — F439 Reaction to severe stress, unspecified: Secondary | ICD-10-CM | POA: Diagnosis not present

## 2021-08-23 DIAGNOSIS — F331 Major depressive disorder, recurrent, moderate: Secondary | ICD-10-CM | POA: Diagnosis not present

## 2021-08-26 DIAGNOSIS — F331 Major depressive disorder, recurrent, moderate: Secondary | ICD-10-CM | POA: Diagnosis not present

## 2021-08-27 DIAGNOSIS — F331 Major depressive disorder, recurrent, moderate: Secondary | ICD-10-CM | POA: Diagnosis not present

## 2021-08-28 DIAGNOSIS — F509 Eating disorder, unspecified: Secondary | ICD-10-CM | POA: Diagnosis not present

## 2021-08-28 DIAGNOSIS — F439 Reaction to severe stress, unspecified: Secondary | ICD-10-CM | POA: Diagnosis not present

## 2021-08-28 DIAGNOSIS — F331 Major depressive disorder, recurrent, moderate: Secondary | ICD-10-CM | POA: Diagnosis not present

## 2021-08-29 DIAGNOSIS — F4010 Social phobia, unspecified: Secondary | ICD-10-CM | POA: Diagnosis not present

## 2021-08-29 DIAGNOSIS — F411 Generalized anxiety disorder: Secondary | ICD-10-CM | POA: Diagnosis not present

## 2021-08-29 DIAGNOSIS — F331 Major depressive disorder, recurrent, moderate: Secondary | ICD-10-CM | POA: Diagnosis not present

## 2021-08-29 DIAGNOSIS — F341 Dysthymic disorder: Secondary | ICD-10-CM | POA: Diagnosis not present

## 2021-08-29 NOTE — Progress Notes (Deleted)
 Office Visit Note  Patient: Emily [name]             Date of Birth: 07/16/2000           MRN: [id number]             PCP: Aronson, Richard, MD Referring: Aronson, Richard, MD Visit Date: 09/04/2021   Subjective:  No chief complaint on file.   History of Present Illness: Emily [name] is a 20 y.o. female here for follow up ***   Previous HPI 02/13/2021  Emily [name] is a 20 y.o. female here for follow up for evaluation of chronic back pain and multiple other symptoms findings at last visit with persistent ESR and CRP elevations other tests all negative. She continues to have back pain worst symptoms are lying in bed at night. She also has increased back pain first thing in the morning.   Previous HPI 01/17/21 Emily [name] is a 20 y.o. female here for evaluation of fatigue, joint pains, dry eyes with elevated ESR and CRP.  She has a chronic history of multiple symptoms going back to at least about 7 years ago.  At that time she had eating disorder with some problems attributed to malnutrition and this condition but have persisted despite resolution of her eating disorder problems.  Mostly with a lot of fatigue, intermittent paresthesias and sensitivity, insomnia, skin rashes, and body pains especially in the back and upper arms.  She feels these have overall just progressed with time and now feels like muscle and joint pains are frequently limiting activities.  She does not usually notice any joint swelling warmth or erythema.  Worst affected areas are in the upper and lower back bilaterally.  She is done some work with physical therapy without a long persistent benefit to symptoms. She takes Cymbalta primarily for anxiety and depression symptoms.  She takes naproxen as needed which is also partially helpful for joint and muscle pains. She has taken Flexeril in the past for muscular pain and trazodone for insomnia but feels this medicine did not do well with her.   She had previous sleep study that was negative for significant sleep apnea. Skin rashes on the face have been described as probably rosacea she has flushing with stress and with alcohol.  She maintains chronic mild rashes on the arms sometimes eczema type symptoms.  She is frequently heat intolerance and feels hot with some skin flushing.  Denies discoloration in hands and feet such as pallor or cyanosis. Dry eye symptoms are a major complaint with some worsening over time for which she was concerned of possible sjogren's syndrome but negative serology to date. She uses eye drops for this currently and has discussed possible going to restasis treatment with her eye doctor. She does not have as much problem with mouth dryness, although drinks water very frequently. She is also had chronic problems with interstitial cystitis apparently some structural abnormality with the ureters and decreased bladder volume.  Previous work-up including contrast CT study of the abdomen with some possible cystic changes but not thought to represent polycystic kidney disease.  Urinalysis has shown persistent proteinuria usually without other findings and renal function remains normal.   Labs reviewed 11/2020 ANA neg dsDNA, SSA, SSB neg RF neg CCP neg ESR 41, 43 CRP 24.8, 35.5 CBC WBC 11.6 Plts 493 CMP wnl TSH 2.64 UA +protein   05/2020 Ferritin 7 Vit D 21   No Rheumatology ROS completed.   PMFS   History:  Patient Active Problem List   Diagnosis Date Noted   Recurrent major depression resistant to treatment ([facility]) 04/17/2021   Essential hypertension 04/17/2021   Acne rosacea 01/17/2021   Elevated C-reactive protein (CRP) 12/11/2020   Elevated sed rate 12/11/2020   Arthralgia 11/27/2020   Social anxiety disorder 08/02/2020   B12 deficiency 06/05/2020   Dry eye 05/25/2020   Vitamin D deficiency 05/25/2020   Epistaxis, recurrent 01/05/2020   Ankle weakness 12/01/2019   Mixed hyperlipidemia 12/01/2019    Paresthesia of skin 05/27/2019   BMI 50.0-59.9, adult ([facility]) 05/12/2019   Chronic fatigue 05/12/2019   Chronic left-sided low back pain without sciatica 12/02/2017   Interstitial cystitis (chronic) with hematuria 11/26/2017   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   Primary hypertension 03/25/2017   Midline thoracic back pain 09/27/2015   Acne vulgaris 09/27/2015   Anorexia nervosa, binge-eating purging type 04/11/2015   Dysmenorrhea 02/27/2015   Sleep disturbance 11/18/2014   Attention deficit hyperactivity disorder (ADHD), combined type 08/24/2014    Past Medical History:  Diagnosis Date   ADHD (attention deficit hyperactivity disorder)    Anorexia    Anxiety    Asthma    B12 deficiency    Bulimia    Depression    Morbid obesity ([facility])     Family History  Problem Relation Age of Onset   Anxiety disorder Mother    Drug abuse Father    Hyperlipidemia Maternal Grandmother    Hypertension Maternal Grandmother    Hyperlipidemia Maternal Grandfather    Hypertension Maternal Grandfather    Colon cancer Neg Hx    Pancreatic cancer Neg Hx    Esophageal cancer Neg Hx    Past Surgical History:  Procedure Laterality Date   ADENOIDECTOMY     BLADDER REPAIR     LIGAMENT REPAIR     TONSILLECTOMY     Social History   Social History Narrative   Not on file   Immunization History  Administered Date(s) Administered   DTaP 12/05/1998, 02/01/1999, 04/15/1999, 12/31/1999, 12/20/2002   HPV 9-valent 01/01/2010, 04/04/2010, 08/07/2010   HPV Quadrivalent 01/01/2010, 04/04/2010, 08/07/2010   Hepatitis A, Adult 12/23/2004, 10/13/2005   Hepatitis A, Ped/Adol-2 Dose 12/23/2004, 10/13/2005   Hepatitis B, ped/adol 09/30/1998, 10/30/1998, 07/01/1999   HiB (PRP-OMP) 12/05/1998, 02/01/1999, 04/15/1999, 12/31/1999   HiB (PRP-T) 12/05/1998, 02/01/1999, 04/15/1999, 12/31/1999   IPV 12/05/1998, 02/01/1999, 04/15/1999, 12/20/2002   Influenza Split 11/10/2011, 11/12/2012, 11/09/2013, 10/14/2018    Influenza,inj,Quad PF,6+ Mos 10/14/2018   Influenza,inj,Quad PF,6-35 Mos 11/25/2016   Influenza,inj,quad, With Preservative 10/03/2014   Influenza-Unspecified 11/25/2016   MMR 09/30/1999, 12/20/2002   Meningococcal B, OMV 11/25/2016, 01/29/2017   Meningococcal Conjugate 01/01/2010, 10/03/2014   Meningococcal Mcv4o 01/01/2010, 10/03/2014   Moderna Sars-Covid-2 Vaccination 02/14/2019, 04/03/2019   Pneumococcal Polysaccharide-23 12/05/1998, 02/01/1999, 04/15/1999, 12/31/1999   Pneumococcal-Unspecified 12/05/1998, 02/01/1999, 04/15/1999, 12/31/1999   Td 05/21/2009, 07/28/2019   Tdap 05/21/2009   Varicella 09/30/1999, 12/23/2004     Objective: Vital Signs: There were no vitals taken for this visit.   Physical Exam   Musculoskeletal Exam: ***  CDAI Exam: CDAI Score: -- Patient Global: --; Provider Global: -- Swollen: --; Tender: -- Joint Exam 09/04/2021   No joint exam has been documented for this visit   There is currently no information documented on the homunculus. Go to the Rheumatology activity and complete the homunculus joint exam.  Investigation: No additional findings.  Imaging: No results found.  Recent Labs: Lab Results  Component Value Date   WBC 11.6 (H)   11/27/2020   HGB 12.1 11/27/2020   PLT 493 (H) 11/27/2020   NA 138 11/27/2020   K 4.4 11/27/2020   CL 105 11/27/2020   CO2 22 11/27/2020   GLUCOSE 118 (H) 11/27/2020   BUN 15 11/27/2020   CREATININE 0.69 11/27/2020   BILITOT 0.3 11/27/2020   ALKPHOS 24 (L) 04/21/2016   AST 13 11/27/2020   ALT 11 11/27/2020   PROT 6.8 11/27/2020   ALBUMIN 3.8 04/21/2016   CALCIUM 9.3 11/27/2020    Speciality Comments: No specialty comments available.  Procedures:  No procedures performed Allergies: Dust mite extract   Assessment / Plan:     Visit Diagnoses: No diagnosis found.  ***  Orders: No orders of the defined types were placed in this encounter.  No orders of the defined types were placed in this  encounter.    Follow-Up Instructions: No follow-ups on file.    L , RT  Note - This record has been created using Dragon software.  Chart creation errors have been sought, but may not always  have been located. Such creation errors do not reflect on  the standard of medical care.  

## 2021-08-30 DIAGNOSIS — F331 Major depressive disorder, recurrent, moderate: Secondary | ICD-10-CM | POA: Diagnosis not present

## 2021-09-02 DIAGNOSIS — F331 Major depressive disorder, recurrent, moderate: Secondary | ICD-10-CM | POA: Diagnosis not present

## 2021-09-04 ENCOUNTER — Ambulatory Visit: Payer: BC Managed Care – PPO | Admitting: Internal Medicine

## 2021-09-04 DIAGNOSIS — F331 Major depressive disorder, recurrent, moderate: Secondary | ICD-10-CM | POA: Diagnosis not present

## 2021-09-04 DIAGNOSIS — F509 Eating disorder, unspecified: Secondary | ICD-10-CM | POA: Diagnosis not present

## 2021-09-04 DIAGNOSIS — G8929 Other chronic pain: Secondary | ICD-10-CM

## 2021-09-04 DIAGNOSIS — F439 Reaction to severe stress, unspecified: Secondary | ICD-10-CM | POA: Diagnosis not present

## 2021-09-04 DIAGNOSIS — R7982 Elevated C-reactive protein (CRP): Secondary | ICD-10-CM

## 2021-09-04 DIAGNOSIS — R700 Elevated erythrocyte sedimentation rate: Secondary | ICD-10-CM

## 2021-09-05 DIAGNOSIS — F331 Major depressive disorder, recurrent, moderate: Secondary | ICD-10-CM | POA: Diagnosis not present

## 2021-09-06 DIAGNOSIS — F4010 Social phobia, unspecified: Secondary | ICD-10-CM | POA: Diagnosis not present

## 2021-09-06 DIAGNOSIS — F331 Major depressive disorder, recurrent, moderate: Secondary | ICD-10-CM | POA: Diagnosis not present

## 2021-09-06 DIAGNOSIS — F411 Generalized anxiety disorder: Secondary | ICD-10-CM | POA: Diagnosis not present

## 2021-09-06 DIAGNOSIS — F341 Dysthymic disorder: Secondary | ICD-10-CM | POA: Diagnosis not present

## 2021-09-09 DIAGNOSIS — F331 Major depressive disorder, recurrent, moderate: Secondary | ICD-10-CM | POA: Diagnosis not present

## 2021-09-10 DIAGNOSIS — F331 Major depressive disorder, recurrent, moderate: Secondary | ICD-10-CM | POA: Diagnosis not present

## 2021-09-11 DIAGNOSIS — F439 Reaction to severe stress, unspecified: Secondary | ICD-10-CM | POA: Diagnosis not present

## 2021-09-11 DIAGNOSIS — F331 Major depressive disorder, recurrent, moderate: Secondary | ICD-10-CM | POA: Diagnosis not present

## 2021-09-11 DIAGNOSIS — F509 Eating disorder, unspecified: Secondary | ICD-10-CM | POA: Diagnosis not present

## 2021-09-12 DIAGNOSIS — F331 Major depressive disorder, recurrent, moderate: Secondary | ICD-10-CM | POA: Diagnosis not present

## 2021-09-13 DIAGNOSIS — F331 Major depressive disorder, recurrent, moderate: Secondary | ICD-10-CM | POA: Diagnosis not present

## 2021-09-17 DIAGNOSIS — F331 Major depressive disorder, recurrent, moderate: Secondary | ICD-10-CM | POA: Diagnosis not present

## 2021-09-18 NOTE — Progress Notes (Signed)
Office Visit Note  Patient: Emily [name]             Date of Birth: 2000/07/09           MRN: [id number]             PCP: Burnard Bunting, MD Referring: Burnard Bunting, MD Visit Date: 09/23/2021   Subjective:  Follow-up (In a lot of pain. Possible MRI? Patient wants to know which next steps to do to start feeling better. )   History of Present Illness: Emily [name] is a 21 y.o. female here for follow up or evaluation of chronic back pain and multiple other symptoms findings at last visit with persistent ESR and CRP elevations other tests all negative.  She was unable to follow-up at originally scheduled appointment but has continued with similar severity of symptoms throughout this year.  She has had some changes made for treatment of anxiety and depression symptoms over the interval.  She continues use of NSAIDs with very incomplete symptom relief.  Pain is particularly bad at nighttime with difficulty falling and staying asleep.  She is taking up to 4 Flexeril tablets at night to sleep when symptoms are particularly bad.  Previous HPI 02/13/2021 Emily [name] is a 21 y.o. female here for follow up for evaluation of chronic back pain and multiple other symptoms findings at last visit with persistent ESR and CRP elevations other tests all negative. She continues to have back pain worst symptoms are lying in bed at night. She also has increased back pain first thing in the morning.   Previous HPI 01/17/21 Emily [name] is a 21 y.o. female here for evaluation of fatigue, joint pains, dry eyes with elevated ESR and CRP.  She has a chronic history of multiple symptoms going back to at least about 7 years ago.  At that time she had eating disorder with some problems attributed to malnutrition and this condition but have persisted despite resolution of her eating disorder problems.  Mostly with a lot of fatigue, intermittent paresthesias and sensitivity, insomnia, skin  rashes, and body pains especially in the back and upper arms.  She feels these have overall just progressed with time and now feels like muscle and joint pains are frequently limiting activities.  She does not usually notice any joint swelling warmth or erythema.  Worst affected areas are in the upper and lower back bilaterally.  She is done some work with physical therapy without a long persistent benefit to symptoms. She takes Cymbalta primarily for anxiety and depression symptoms.  She takes naproxen as needed which is also partially helpful for joint and muscle pains. She has taken Flexeril in the past for muscular pain and trazodone for insomnia but feels this medicine did not do well with her.  She had previous sleep study that was negative for significant sleep apnea. Skin rashes on the face have been described as probably rosacea she has flushing with stress and with alcohol.  She maintains chronic mild rashes on the arms sometimes eczema type symptoms.  She is frequently heat intolerance and feels hot with some skin flushing.  Denies discoloration in hands and feet such as pallor or cyanosis. Dry eye symptoms are a major complaint with some worsening over time for which she was concerned of possible sjogren's syndrome but negative serology to date. She uses eye drops for this currently and has discussed possible going to restasis treatment with her eye doctor. She does not have  as much problem with mouth dryness, although drinks water very frequently. She is also had chronic problems with interstitial cystitis apparently some structural abnormality with the ureters and decreased bladder volume.  Previous work-up including contrast CT study of the abdomen with some possible cystic changes but not thought to represent polycystic kidney disease.  Urinalysis has shown persistent proteinuria usually without other findings and renal function remains normal.   Labs reviewed 11/2020 ANA neg dsDNA, SSA, SSB  neg RF neg CCP neg ESR 41, 43 CRP 24.8, 35.5 CBC WBC 11.6 Plts 493 CMP wnl TSH 2.64 UA +protein   05/2020 Ferritin 7 Vit D 21   Review of Systems  Constitutional:  Positive for fatigue.  HENT:  Positive for mouth dryness. Negative for mouth sores.   Eyes:  Positive for dryness.  Respiratory:  Positive for shortness of breath.   Cardiovascular:  Positive for chest pain. Negative for palpitations.  Gastrointestinal:  Negative for blood in stool, constipation and diarrhea.  Endocrine: Negative for increased urination.  Genitourinary:  Negative for involuntary urination.  Musculoskeletal:  Positive for joint pain, joint pain, myalgias, muscle weakness, morning stiffness, muscle tenderness and myalgias. Negative for gait problem and joint swelling.  Skin:  Negative for color change, rash, hair loss and sensitivity to sunlight.  Allergic/Immunologic: Negative for susceptible to infections.  Neurological:  Positive for dizziness and headaches.  Hematological:  Negative for swollen glands.  Psychiatric/Behavioral:  Positive for depressed mood and sleep disturbance. The patient is nervous/anxious.     PMFS History:  Patient Active Problem List   Diagnosis Date Noted   Recurrent major depression resistant to treatment ([place]) 04/17/2021   Essential hypertension 04/17/2021   Acne rosacea 01/17/2021   Elevated C-reactive protein (CRP) 12/11/2020   Elevated sed rate 12/11/2020   Arthralgia 11/27/2020   Social anxiety disorder 08/02/2020   B12 deficiency 06/05/2020   Dry eye 05/25/2020   Vitamin D deficiency 05/25/2020   Epistaxis, recurrent 01/05/2020   Ankle weakness 12/01/2019   Mixed hyperlipidemia 12/01/2019   Paresthesia of skin 05/27/2019   BMI 50.0-59.9, adult ([place]) 05/12/2019   Chronic fatigue 05/12/2019   Chronic left-sided low back pain without sciatica 12/02/2017   Interstitial cystitis (chronic) with hematuria 35/46/5681   Partially duplicated ureter 27/51/7001    Primary hypertension 03/25/2017   Midline thoracic back pain 09/27/2015   Acne vulgaris 09/27/2015   Anorexia nervosa, binge-eating purging type 04/11/2015   Dysmenorrhea 02/27/2015   Sleep disturbance 11/18/2014   Attention deficit hyperactivity disorder (ADHD), combined type 08/24/2014    Past Medical History:  Diagnosis Date   ADHD (attention deficit hyperactivity disorder)    Anorexia    Anxiety    Asthma    B12 deficiency    Bulimia    Depression    Morbid obesity ([place])    Treatment-resistant depression     Family History  Problem Relation Age of Onset   Anxiety disorder Mother    Drug abuse Father    Hyperlipidemia Maternal Grandmother    Hypertension Maternal Grandmother    Hyperlipidemia Maternal Grandfather    Hypertension Maternal Grandfather    Colon cancer Neg Hx    Pancreatic cancer Neg Hx    Esophageal cancer Neg Hx    Past Surgical History:  Procedure Laterality Date   ADENOIDECTOMY     BLADDER REPAIR     LIGAMENT REPAIR     TONSILLECTOMY     Social History   Social History Narrative   Not on file  Immunization History  Administered Date(s) Administered   DTaP 12/05/1998, 02/01/1999, 04/15/1999, 12/31/1999, 12/20/2002   HIB (PRP-OMP) 12/05/1998, 02/01/1999, 04/15/1999, 12/31/1999   HIB (PRP-T) 12/05/1998, 02/01/1999, 04/15/1999, 12/31/1999   HPV 9-valent 01/01/2010, 04/04/2010, 08/07/2010   HPV Quadrivalent 01/01/2010, 04/04/2010, 08/07/2010   Hepatitis A, Adult 12/23/2004, 10/13/2005   Hepatitis A, Ped/Adol-2 Dose 12/23/2004, 10/13/2005   Hepatitis B, PED/ADOLESCENT 1998/04/12, 10/30/1998, 07/01/1999   IPV 12/05/1998, 02/01/1999, 04/15/1999, 12/20/2002   Influenza Split 11/10/2011, 11/12/2012, 11/09/2013, 10/14/2018   Influenza,inj,Quad PF,6+ Mos 10/14/2018   Influenza,inj,Quad PF,6-35 Mos 11/25/2016   Influenza,inj,quad, With Preservative 10/03/2014   Influenza-Unspecified 11/25/2016   MMR 09/30/1999, 12/20/2002   Meningococcal B, OMV  11/25/2016, 01/29/2017   Meningococcal Conjugate 01/01/2010, 10/03/2014   Meningococcal Mcv4o 01/01/2010, 10/03/2014   Moderna Sars-Covid-2 Vaccination 02/14/2019, 04/03/2019   Pneumococcal Polysaccharide-23 12/05/1998, 02/01/1999, 04/15/1999, 12/31/1999   Pneumococcal-Unspecified 12/05/1998, 02/01/1999, 04/15/1999, 12/31/1999   Td 05/21/2009, 07/28/2019   Tdap 05/21/2009   Varicella 09/30/1999, 12/23/2004     Objective: Vital Signs: BP 121/79 (BP Location: Right Arm, Patient Position: Sitting, Cuff Size: Large)   Pulse (!) 109   Resp 15   Ht $R'5\' 2"'BY$  (1.575 m)   Wt (!) 300 lb 6.4 oz (136.3 kg)   BMI 54.94 kg/m    Physical Exam Constitutional:      Appearance: She is obese.  Eyes:     Conjunctiva/sclera: Conjunctivae normal.  Cardiovascular:     Rate and Rhythm: Regular rhythm. Tachycardia present.  Pulmonary:     Effort: Pulmonary effort is normal.     Breath sounds: Normal breath sounds.  Skin:    General: Skin is warm and dry.     Comments: Central facial erythema, no papules  Neurological:     Mental Status: She is alert.      Musculoskeletal Exam:  Shoulders full ROM tenderness to pressure over shoulders and paraspinal muscles Elbows full ROM no tenderness or swelling Wrists full ROM no tenderness or swelling Fingers full ROM no tenderness or swelling Bilateral paraspinal muscle tenderness to palpation over mid and low back Decreased FABER ROM tolerated, tenderness to pressure most severe over left side of sacrum, does somewhat overlie SI joint Knees full ROM no tenderness or swelling Ankles full ROM no tenderness or swelling   Investigation: No additional findings.  Imaging: No results found.  Recent Labs: Lab Results  Component Value Date   WBC 11.6 (H) 11/27/2020   HGB 12.1 11/27/2020   PLT 493 (H) 11/27/2020   NA 138 11/27/2020   K 4.4 11/27/2020   CL 105 11/27/2020   CO2 22 11/27/2020   GLUCOSE 118 (H) 11/27/2020   BUN 15 11/27/2020    CREATININE 0.69 11/27/2020   BILITOT 0.3 11/27/2020   ALKPHOS 24 (L) 04/21/2016   AST 13 11/27/2020   ALT 11 11/27/2020   PROT 6.8 11/27/2020   ALBUMIN 3.8 04/21/2016   CALCIUM 9.3 11/27/2020    Speciality Comments: No specialty comments available.  Procedures:  No procedures performed Allergies: Dust mite extract   Assessment / Plan:     Visit Diagnoses: Chronic left-sided low back pain without sciatica Chronic midline thoracic back pain  Persistent low back pain x-rays are pretty unremarkable she is HLA-B27 negative but with inflammatory markers and back pain criteria of prolonged morning symptoms nighttime awakening and young age of onset need to rule out a nonradiographic axial spondylarthritis.  She has been taking multiple medications including NSAIDs and muscle relaxants most commonly well as intermittent steroids for at  least a year now with limited response.  We will plan to obtain MRI of the lumbar spine and sacrum to either identify inflammatory sacroiliitis or if there is an alternate structural cause for the symptoms.  Elevated sed rate Elevated C-reactive protein (CRP)  Persistently elevated in outside clinic and repeat at our lab earlier this year.  No peripheral joint inflammatory changes or obvious skin inflammation on exam today.  Chronic fatigue  Probably multifactorial she has prolonged sleep latency and sleep fragmentation possible sleep apnea also has some depression or anxiety mood problems contributing.  Some may also be related to ongoing inflammation with abnormal serum markers previous work-up.  Mri lumbar/SI needed   Orders: Orders Placed This Encounter  Procedures   MR Lumbar Spine W Wo Contrast   MR SACRUM SI JOINTS W WO CONTRAST   No orders of the defined types were placed in this encounter.    Follow-Up Instructions: No follow-ups on file.   Collier Salina, MD  Note - This record has been created using [employer].  Chart  creation errors have been sought, but may not always  have been located. Such creation errors do not reflect on  the standard of medical care.

## 2021-09-19 DIAGNOSIS — F509 Eating disorder, unspecified: Secondary | ICD-10-CM | POA: Diagnosis not present

## 2021-09-19 DIAGNOSIS — F439 Reaction to severe stress, unspecified: Secondary | ICD-10-CM | POA: Diagnosis not present

## 2021-09-19 DIAGNOSIS — F331 Major depressive disorder, recurrent, moderate: Secondary | ICD-10-CM | POA: Diagnosis not present

## 2021-09-20 DIAGNOSIS — F411 Generalized anxiety disorder: Secondary | ICD-10-CM | POA: Diagnosis not present

## 2021-09-20 DIAGNOSIS — F4010 Social phobia, unspecified: Secondary | ICD-10-CM | POA: Diagnosis not present

## 2021-09-20 DIAGNOSIS — F341 Dysthymic disorder: Secondary | ICD-10-CM | POA: Diagnosis not present

## 2021-09-23 ENCOUNTER — Ambulatory Visit: Payer: BC Managed Care – PPO | Attending: Internal Medicine | Admitting: Internal Medicine

## 2021-09-23 ENCOUNTER — Encounter: Payer: Self-pay | Admitting: Internal Medicine

## 2021-09-23 VITALS — BP 121/79 | HR 109 | Resp 15 | Ht 62.0 in | Wt 300.4 lb

## 2021-09-23 DIAGNOSIS — M546 Pain in thoracic spine: Secondary | ICD-10-CM

## 2021-09-23 DIAGNOSIS — R7982 Elevated C-reactive protein (CRP): Secondary | ICD-10-CM

## 2021-09-23 DIAGNOSIS — R5382 Chronic fatigue, unspecified: Secondary | ICD-10-CM

## 2021-09-23 DIAGNOSIS — F331 Major depressive disorder, recurrent, moderate: Secondary | ICD-10-CM | POA: Diagnosis not present

## 2021-09-23 DIAGNOSIS — M545 Low back pain, unspecified: Secondary | ICD-10-CM

## 2021-09-23 DIAGNOSIS — R700 Elevated erythrocyte sedimentation rate: Secondary | ICD-10-CM

## 2021-09-23 DIAGNOSIS — G8929 Other chronic pain: Secondary | ICD-10-CM

## 2021-09-25 DIAGNOSIS — F331 Major depressive disorder, recurrent, moderate: Secondary | ICD-10-CM | POA: Diagnosis not present

## 2021-09-26 DIAGNOSIS — F439 Reaction to severe stress, unspecified: Secondary | ICD-10-CM | POA: Diagnosis not present

## 2021-09-26 DIAGNOSIS — F331 Major depressive disorder, recurrent, moderate: Secondary | ICD-10-CM | POA: Diagnosis not present

## 2021-09-26 DIAGNOSIS — F509 Eating disorder, unspecified: Secondary | ICD-10-CM | POA: Diagnosis not present

## 2021-09-27 DIAGNOSIS — F331 Major depressive disorder, recurrent, moderate: Secondary | ICD-10-CM | POA: Diagnosis not present

## 2021-09-30 ENCOUNTER — Telehealth: Payer: Self-pay | Admitting: Internal Medicine

## 2021-09-30 DIAGNOSIS — F331 Major depressive disorder, recurrent, moderate: Secondary | ICD-10-CM | POA: Diagnosis not present

## 2021-09-30 NOTE — Telephone Encounter (Signed)
Patient called the office stating she was referred to have an MRI by Dr. Benjamine Mola but hasn't heard anything from them. Patient would like to know where the referral was sent so she can call.

## 2021-09-30 NOTE — Telephone Encounter (Signed)
Advised patient that her MRI has been authorized and we will be reaching out to inform her.

## 2021-10-01 ENCOUNTER — Ambulatory Visit (INDEPENDENT_AMBULATORY_CARE_PROVIDER_SITE_OTHER): Payer: BC Managed Care – PPO | Admitting: Family

## 2021-10-01 ENCOUNTER — Encounter: Payer: Self-pay | Admitting: Family

## 2021-10-01 VITALS — BP 118/88 | HR 98 | Temp 98.0°F | Ht 62.0 in | Wt 299.8 lb

## 2021-10-01 DIAGNOSIS — R5382 Chronic fatigue, unspecified: Secondary | ICD-10-CM

## 2021-10-01 DIAGNOSIS — K219 Gastro-esophageal reflux disease without esophagitis: Secondary | ICD-10-CM

## 2021-10-01 DIAGNOSIS — N3010 Interstitial cystitis (chronic) without hematuria: Secondary | ICD-10-CM | POA: Diagnosis not present

## 2021-10-01 DIAGNOSIS — I10 Essential (primary) hypertension: Secondary | ICD-10-CM | POA: Diagnosis not present

## 2021-10-01 DIAGNOSIS — Z23 Encounter for immunization: Secondary | ICD-10-CM | POA: Diagnosis not present

## 2021-10-01 DIAGNOSIS — R7309 Other abnormal glucose: Secondary | ICD-10-CM

## 2021-10-01 DIAGNOSIS — Z13220 Encounter for screening for lipoid disorders: Secondary | ICD-10-CM

## 2021-10-01 DIAGNOSIS — J4520 Mild intermittent asthma, uncomplicated: Secondary | ICD-10-CM

## 2021-10-01 MED ORDER — ALBUTEROL SULFATE HFA 108 (90 BASE) MCG/ACT IN AERS
1.0000 | INHALATION_SPRAY | Freq: Four times a day (QID) | RESPIRATORY_TRACT | 1 refills | Status: DC | PRN
Start: 2021-10-01 — End: 2021-12-24

## 2021-10-01 MED ORDER — LISINOPRIL 10 MG PO TABS
10.0000 mg | ORAL_TABLET | Freq: Every day | ORAL | 1 refills | Status: DC
Start: 2021-10-01 — End: 2022-06-03

## 2021-10-01 MED ORDER — OMEPRAZOLE 20 MG PO CPDR: 20.0000 mg | DELAYED_RELEASE_CAPSULE | Freq: Every day | ORAL | 1 refills | Status: DC

## 2021-10-01 NOTE — Progress Notes (Signed)
Emily [name] is a 21 y.o. female with the following history as recorded in Epic[employer]:  Patient Active Problem List   Diagnosis Date Noted   Recurrent major depression resistant to treatment ([place]) 04/17/2021   Essential hypertension 04/17/2021   Acne rosacea 01/17/2021   Elevated C-reactive protein (CRP) 12/11/2020   Elevated sed rate 12/11/2020   Arthralgia 11/27/2020   Social anxiety disorder 08/02/2020   B12 deficiency 06/05/2020   Dry eye 05/25/2020   Vitamin D deficiency 05/25/2020   Epistaxis, recurrent 01/05/2020   Ankle weakness 12/01/2019   Mixed hyperlipidemia 12/01/2019   Paresthesia of skin 05/27/2019   BMI 50.0-59.9, adult ([place]) 05/12/2019   Chronic fatigue 05/12/2019   Chronic left-sided low back pain without sciatica 12/02/2017   Interstitial cystitis (chronic) with hematuria 16/10/9602   Partially duplicated ureter 54/09/8117   Primary hypertension 03/25/2017   Midline thoracic back pain 09/27/2015   Acne vulgaris 09/27/2015   Anorexia nervosa, binge-eating purging type 04/11/2015   Dysmenorrhea 02/27/2015   Sleep disturbance 11/18/2014   Attention deficit hyperactivity disorder (ADHD), combined type 08/24/2014    Current Outpatient Medications  Medication Sig Dispense Refill   cyclobenzaprine (FLEXERIL) 10 MG tablet Take 1 tablet (10 mg total) by mouth 3 (three) times daily as needed for muscle spasms. 30 tablet 0   DULoxetine (CYMBALTA) 60 MG capsule TAKE 1 CAPSULE BY MOUTH 2 TIMES DAILY. 180 capsule 1   Methylphenidate HCl ER 54 MG TB24      naproxen (NAPROSYN) 500 MG tablet TAKE 1 TABLET BY MOUTH TWICE A DAY AS NEEDED 60 tablet 1   QUEtiapine (SEROQUEL) 25 MG tablet Take 25 mg by mouth at bedtime.     QUEtiapine (SEROQUEL) 50 MG tablet Take 50 mg by mouth at bedtime.     albuterol (VENTOLIN HFA) 108 (90 Base) MCG/ACT inhaler Inhale 1-2 puffs into the lungs every 6 (six) hours as needed for wheezing or shortness of breath. 18 each 1   lisinopril  (ZESTRIL) 10 MG tablet Take 1 tablet (10 mg total) by mouth daily. 90 tablet 1   omeprazole (PRILOSEC) 20 MG capsule Take 1 capsule (20 mg total) by mouth daily. 90 capsule 1   No current facility-administered medications for this visit.    Allergies: Dust mite extract  Past Medical History:  Diagnosis Date   ADHD (attention deficit hyperactivity disorder)    Anorexia    Anxiety    Asthma    B12 deficiency    Bulimia    Depression    Morbid obesity ([place])    Treatment-resistant depression     Past Surgical History:  Procedure Laterality Date   ADENOIDECTOMY     BLADDER REPAIR     LIGAMENT REPAIR     TONSILLECTOMY      Family History  Problem Relation Age of Onset   Anxiety disorder Mother    Drug abuse Father    Hyperlipidemia Maternal Grandmother    Hypertension Maternal Grandmother    Hyperlipidemia Maternal Grandfather    Hypertension Maternal Grandfather    Colon cancer Neg Hx    Pancreatic cancer Neg Hx    Esophageal cancer Neg Hx     Social History   Tobacco Use   Smoking status: Never    Passive exposure: Yes   Smokeless tobacco: Never   Tobacco comments:    mother smokes outside home  Substance Use Topics   Alcohol use: Yes    Alcohol/week: 0.0 standard drinks of alcohol    Comment:  occasional    Subjective:   Patient presents today as new patient to establish care;  Is transitioning from the pediatric world and needs to gets updated referrals to her specialists; Is working with rheumatology and psychiatrist/ therapist regularly;  Would like to get flu shot today;  Defers establishing at GYN at this time;      Objective:  Vitals:   10/01/21 1412  BP: 118/88  Pulse: 98  Temp: 98 F (36.7 C)  TempSrc: Oral  SpO2: 99%  Weight: 299 lb 12.8 oz (136 kg)  Height: 5\' 2"  (1.575 m)    General: Well developed, well nourished, in no acute distress  Skin : Warm and dry.  Head: Normocephalic and atraumatic  Lungs: Respirations unlabored; clear to  auscultation bilaterally without wheeze, rales, rhonchi  CVS exam: normal rate and regular rhythm.  Neurologic: Alert and oriented; speech intact; face symmetrical; moves all extremities well; CNII-XII intact without focal deficit   Assessment:  1. Intermittent asthma without complication, unspecified asthma severity   2. Primary hypertension   3. Gastroesophageal reflux disease   4. Interstitial cystitis   5. Need for immunization against influenza     Plan:  Refill updated on albuterol inhaler; patient asks to have referral updated back to her asthma/ allergy specialist. Stable; refill updated; Stable; refill updated;  Refer to urogyn to establish care- prefers a female provider;  Flu shot given today;  She will return for fasting labs at later date; follow up to be determined based on lab results; will most likely plan to see patient q6-12 months.   No follow-ups on file.  Orders Placed This Encounter  Procedures   Flu Vaccine QUAD 75mo+IM (Fluarix, Fluzone & Alfiuria Quad PF)   Ambulatory referral to Allergy    Referral Priority:   Routine    Referral Type:   Allergy Testing    Referral Reason:   Specialty Services Required    Requested Specialty:   Allergy    Number of Visits Requested:   1   Ambulatory referral to Urogynecology    Referral Priority:   Routine    Referral Type:   Consultation    Referral Reason:   Specialty Services Required    Requested Specialty:   Urology    Number of Visits Requested:   1    Requested Prescriptions   Signed Prescriptions Disp Refills   albuterol (VENTOLIN HFA) 108 (90 Base) MCG/ACT inhaler 18 each 1    Sig: Inhale 1-2 puffs into the lungs every 6 (six) hours as needed for wheezing or shortness of breath.   lisinopril (ZESTRIL) 10 MG tablet 90 tablet 1    Sig: Take 1 tablet (10 mg total) by mouth daily.   omeprazole (PRILOSEC) 20 MG capsule 90 capsule 1    Sig: Take 1 capsule (20 mg total) by mouth daily.

## 2021-10-02 ENCOUNTER — Other Ambulatory Visit: Payer: BC Managed Care – PPO

## 2021-10-02 DIAGNOSIS — F331 Major depressive disorder, recurrent, moderate: Secondary | ICD-10-CM | POA: Diagnosis not present

## 2021-10-03 DIAGNOSIS — F509 Eating disorder, unspecified: Secondary | ICD-10-CM | POA: Diagnosis not present

## 2021-10-03 DIAGNOSIS — F331 Major depressive disorder, recurrent, moderate: Secondary | ICD-10-CM | POA: Diagnosis not present

## 2021-10-03 DIAGNOSIS — F439 Reaction to severe stress, unspecified: Secondary | ICD-10-CM | POA: Diagnosis not present

## 2021-10-04 DIAGNOSIS — F341 Dysthymic disorder: Secondary | ICD-10-CM | POA: Diagnosis not present

## 2021-10-04 DIAGNOSIS — F411 Generalized anxiety disorder: Secondary | ICD-10-CM | POA: Diagnosis not present

## 2021-10-07 ENCOUNTER — Other Ambulatory Visit (INDEPENDENT_AMBULATORY_CARE_PROVIDER_SITE_OTHER): Payer: BC Managed Care – PPO

## 2021-10-07 DIAGNOSIS — Z13220 Encounter for screening for lipoid disorders: Secondary | ICD-10-CM | POA: Diagnosis not present

## 2021-10-07 DIAGNOSIS — I10 Essential (primary) hypertension: Secondary | ICD-10-CM | POA: Diagnosis not present

## 2021-10-07 DIAGNOSIS — R5382 Chronic fatigue, unspecified: Secondary | ICD-10-CM | POA: Diagnosis not present

## 2021-10-07 DIAGNOSIS — R7309 Other abnormal glucose: Secondary | ICD-10-CM | POA: Diagnosis not present

## 2021-10-08 LAB — CBC WITH DIFFERENTIAL/PLATELET
Basophils Absolute: 0.1 10*3/uL (ref 0.0–0.1)
Basophils Relative: 0.9 % (ref 0.0–3.0)
Eosinophils Absolute: 0.1 10*3/uL (ref 0.0–0.7)
Eosinophils Relative: 0.9 % (ref 0.0–5.0)
HCT: 38.9 % (ref 36.0–46.0)
Hemoglobin: 12.7 g/dL (ref 12.0–15.0)
Lymphocytes Relative: 29.9 % (ref 12.0–46.0)
Lymphs Abs: 3.4 10*3/uL (ref 0.7–4.0)
MCHC: 32.6 g/dL (ref 30.0–36.0)
MCV: 91.9 fl (ref 78.0–100.0)
Monocytes Absolute: 0.8 10*3/uL (ref 0.1–1.0)
Monocytes Relative: 7 % (ref 3.0–12.0)
Neutro Abs: 6.9 10*3/uL (ref 1.4–7.7)
Neutrophils Relative %: 61.3 % (ref 43.0–77.0)
Platelets: 377 10*3/uL (ref 150.0–400.0)
RBC: 4.23 Mil/uL (ref 3.87–5.11)
RDW: 15.4 % (ref 11.5–15.5)
WBC: 11.3 10*3/uL — ABNORMAL HIGH (ref 4.0–10.5)

## 2021-10-08 LAB — COMPREHENSIVE METABOLIC PANEL
ALT: 11 U/L (ref 0–35)
AST: 11 U/L (ref 0–37)
Albumin: 3.9 g/dL (ref 3.5–5.2)
Alkaline Phosphatase: 33 U/L — ABNORMAL LOW (ref 39–117)
BUN: 18 mg/dL (ref 6–23)
CO2: 22 mEq/L (ref 19–32)
Calcium: 9.3 mg/dL (ref 8.4–10.5)
Chloride: 103 mEq/L (ref 96–112)
Creatinine, Ser: 0.75 mg/dL (ref 0.40–1.20)
GFR: 112.53 mL/min (ref >60.00)
Glucose, Bld: 87 mg/dL (ref 70–99)
Potassium: 4.5 mEq/L (ref 3.5–5.1)
Sodium: 136 mEq/L (ref 135–145)
Total Bilirubin: 0.4 mg/dL (ref 0.2–1.2)
Total Protein: 6.9 g/dL (ref 6.0–8.3)

## 2021-10-08 LAB — HEMOGLOBIN A1C: Hgb A1c MFr Bld: 5.7 % (ref 4.6–6.5)

## 2021-10-08 LAB — TSH: TSH: 2.39 u[IU]/mL (ref 0.35–5.50)

## 2021-10-08 LAB — LIPID PANEL
Cholesterol: 236 mg/dL — ABNORMAL HIGH (ref 0–200)
HDL: 46.3 mg/dL (ref >39.00)
LDL Cholesterol: 152 mg/dL — ABNORMAL HIGH (ref 0–99)
NonHDL: 190.02
Total CHOL/HDL Ratio: 5
Triglycerides: 192 mg/dL — ABNORMAL HIGH (ref 0.0–149.0)
VLDL: 38.4 mg/dL (ref 0.0–40.0)

## 2021-10-09 DIAGNOSIS — F331 Major depressive disorder, recurrent, moderate: Secondary | ICD-10-CM | POA: Diagnosis not present

## 2021-10-10 ENCOUNTER — Encounter: Payer: Self-pay | Admitting: Family

## 2021-10-11 DIAGNOSIS — F331 Major depressive disorder, recurrent, moderate: Secondary | ICD-10-CM | POA: Diagnosis not present

## 2021-10-13 ENCOUNTER — Ambulatory Visit
Admission: RE | Admit: 2021-10-13 | Discharge: 2021-10-13 | Disposition: A | Discharge status: Home / Self Care | Payer: BC Managed Care – PPO | Source: Ambulatory Visit | Attending: Internal Medicine | Admitting: Internal Medicine

## 2021-10-13 DIAGNOSIS — M545 Low back pain, unspecified: Secondary | ICD-10-CM | POA: Diagnosis not present

## 2021-10-13 DIAGNOSIS — G8929 Other chronic pain: Secondary | ICD-10-CM

## 2021-10-13 MED ORDER — GADOPICLENOL 0.5 MMOL/ML IV SOLN
10.0000 mL | Freq: Once | INTRAVENOUS | Status: AC | PRN
  Administered 2021-10-13: 10 mL via INTRAVENOUS

## 2021-10-15 ENCOUNTER — Telehealth: Payer: Self-pay | Admitting: Internal Medicine

## 2021-10-15 DIAGNOSIS — F331 Major depressive disorder, recurrent, moderate: Secondary | ICD-10-CM | POA: Diagnosis not present

## 2021-10-15 NOTE — Telephone Encounter (Signed)
Patient states when the temperature changes she usually has muscle pain and discomfort, but lately she is also experiencing fatigue to the point where she is having difficulty with daily activities.  Patient requested a return call to let her know if there is anything Dr. Benjamine Mola recommends while she waits for her MRI results.

## 2021-10-16 ENCOUNTER — Ambulatory Visit: Payer: BC Managed Care – PPO | Admitting: Internal Medicine

## 2021-10-17 DIAGNOSIS — F331 Major depressive disorder, recurrent, moderate: Secondary | ICD-10-CM | POA: Diagnosis not present

## 2021-10-17 DIAGNOSIS — F439 Reaction to severe stress, unspecified: Secondary | ICD-10-CM | POA: Diagnosis not present

## 2021-10-17 DIAGNOSIS — F509 Eating disorder, unspecified: Secondary | ICD-10-CM | POA: Diagnosis not present

## 2021-10-18 DIAGNOSIS — F411 Generalized anxiety disorder: Secondary | ICD-10-CM | POA: Diagnosis not present

## 2021-10-18 DIAGNOSIS — F4010 Social phobia, unspecified: Secondary | ICD-10-CM | POA: Diagnosis not present

## 2021-10-18 DIAGNOSIS — F341 Dysthymic disorder: Secondary | ICD-10-CM | POA: Diagnosis not present

## 2021-10-22 NOTE — Progress Notes (Signed)
Office Visit Note  Patient: Emily [name]             Date of Birth: August 21, 2000           MRN: [id number]             PCP: Marrian Salvage, FNP Referring: Marrian Salvage,* Visit Date: 10/23/2021   Subjective:  Follow-up (Achy and fatigued. Follow-up with results.)   History of Present Illness: Emily [name] is a 21 y.o. female here for follow up for evaluation of chronic back pain and multiple other symptoms findings at last visit with persistent ESR and CRP elevations other tests all negative.  Sacrum and lumbar spine MRI was obtained and these were negative for any evidence of inflammatory sacroiliitis or active lumbar spine spondyloarthritis.  She continues having pain and stiffness in multiple areas and general fatigue.  Previous HPI 09/23/2021 Emily [name] is a 21 y.o. female here for follow up for evaluation of chronic back pain and multiple other symptoms findings at last visit with persistent ESR and CRP elevations other tests all negative.  She was unable to follow-up at originally scheduled appointment but has continued with similar severity of symptoms throughout this year.  She has had some changes made for treatment of anxiety and depression symptoms over the interval.  She continues use of NSAIDs with very incomplete symptom relief.  Pain is particularly bad at nighttime with difficulty falling and staying asleep.  She is taking up to 4 Flexeril tablets at night to sleep when symptoms are particularly bad.   Previous HPI 02/13/2021 Emily [name] is a 21 y.o. female here for follow up for evaluation of chronic back pain and multiple other symptoms findings at last visit with persistent ESR and CRP elevations other tests all negative. She continues to have back pain worst symptoms are lying in bed at night. She also has increased back pain first thing in the morning.   Previous HPI 01/17/21 Emily [name] is a 21 y.o. female here  for evaluation of fatigue, joint pains, dry eyes with elevated ESR and CRP.  She has a chronic history of multiple symptoms going back to at least about 7 years ago.  At that time she had eating disorder with some problems attributed to malnutrition and this condition but have persisted despite resolution of her eating disorder problems.  Mostly with a lot of fatigue, intermittent paresthesias and sensitivity, insomnia, skin rashes, and body pains especially in the back and upper arms.  She feels these have overall just progressed with time and now feels like muscle and joint pains are frequently limiting activities.  She does not usually notice any joint swelling warmth or erythema.  Worst affected areas are in the upper and lower back bilaterally.  She is done some work with physical therapy without a long persistent benefit to symptoms. She takes Cymbalta primarily for anxiety and depression symptoms.  She takes naproxen as needed which is also partially helpful for joint and muscle pains. She has taken Flexeril in the past for muscular pain and trazodone for insomnia but feels this medicine did not do well with her.  She had previous sleep study that was negative for significant sleep apnea. Skin rashes on the face have been described as probably rosacea she has flushing with stress and with alcohol.  She maintains chronic mild rashes on the arms sometimes eczema type symptoms.  She is frequently heat intolerance and feels hot with some  skin flushing.  Denies discoloration in hands and feet such as pallor or cyanosis. Dry eye symptoms are a major complaint with some worsening over time for which she was concerned of possible sjogren's syndrome but negative serology to date. She uses eye drops for this currently and has discussed possible going to restasis treatment with her eye doctor. She does not have as much problem with mouth dryness, although drinks water very frequently. She is also had chronic  problems with interstitial cystitis apparently some structural abnormality with the ureters and decreased bladder volume.  Previous work-up including contrast CT study of the abdomen with some possible cystic changes but not thought to represent polycystic kidney disease.  Urinalysis has shown persistent proteinuria usually without other findings and renal function remains normal.   Labs reviewed 11/2020 ANA neg dsDNA, SSA, SSB neg RF neg CCP neg ESR 41, 43 CRP 24.8, 35.5 CBC WBC 11.6 Plts 493 CMP wnl TSH 2.64 UA +protein   05/2020 Ferritin 7 Vit D 21   Review of Systems  Constitutional:  Positive for fatigue.  HENT:  Positive for mouth dryness. Negative for mouth sores.   Eyes:  Positive for dryness.  Respiratory:  Positive for shortness of breath.   Cardiovascular:  Negative for chest pain and palpitations.  Gastrointestinal:  Negative for blood in stool, constipation and diarrhea.  Endocrine: Negative for increased urination.  Genitourinary:  Negative for involuntary urination.  Musculoskeletal:  Positive for joint pain, gait problem, joint pain, joint swelling, myalgias, muscle weakness, morning stiffness, muscle tenderness and myalgias.  Skin:  Negative for rash, hair loss and sensitivity to sunlight.  Allergic/Immunologic: Negative for susceptible to infections.  Neurological:  Positive for headaches. Negative for dizziness.  Hematological:  Negative for swollen glands.  Psychiatric/Behavioral:  Positive for depressed mood and sleep disturbance. The patient is nervous/anxious.     PMFS History:  Patient Active Problem List   Diagnosis Date Noted   Recurrent major depression resistant to treatment ([place]) 04/17/2021   Essential hypertension 04/17/2021   Acne rosacea 01/17/2021   Elevated C-reactive protein (CRP) 12/11/2020   Elevated sed rate 12/11/2020   Arthralgia 11/27/2020   Social anxiety disorder 08/02/2020   B12 deficiency 06/05/2020   Dry eye 05/25/2020    Vitamin D deficiency 05/25/2020   Epistaxis, recurrent 01/05/2020   Ankle weakness 12/01/2019   Mixed hyperlipidemia 12/01/2019   Paresthesia of skin 05/27/2019   BMI 50.0-59.9, adult ([place]) 05/12/2019   Chronic fatigue 05/12/2019   Chronic left-sided low back pain without sciatica 12/02/2017   Interstitial cystitis (chronic) with hematuria 91/47/8295   Partially duplicated ureter 62/13/0865   Primary hypertension 03/25/2017   Midline thoracic back pain 09/27/2015   Acne vulgaris 09/27/2015   Anorexia nervosa, binge-eating purging type 04/11/2015   Dysmenorrhea 02/27/2015   Sleep disturbance 11/18/2014   Attention deficit hyperactivity disorder (ADHD), combined type 08/24/2014    Past Medical History:  Diagnosis Date   ADHD (attention deficit hyperactivity disorder)    Anorexia    Anxiety    Asthma    B12 deficiency    Bulimia    Depression    Morbid obesity ([place])    Treatment-resistant depression     Family History  Problem Relation Age of Onset   Anxiety disorder Mother    Drug abuse Father    Hyperlipidemia Maternal Grandmother    Hypertension Maternal Grandmother    Hyperlipidemia Maternal Grandfather    Hypertension Maternal Grandfather    Colon cancer Neg Hx  Pancreatic cancer Neg Hx    Esophageal cancer Neg Hx    Past Surgical History:  Procedure Laterality Date   ADENOIDECTOMY     BLADDER REPAIR     LIGAMENT REPAIR     TONSILLECTOMY     Social History   Social History Narrative   Not on file   Immunization History  Administered Date(s) Administered   DTaP 12/05/1998, 02/01/1999, 04/15/1999, 12/31/1999, 12/20/2002   HIB (PRP-OMP) 12/05/1998, 02/01/1999, 04/15/1999, 12/31/1999   HIB (PRP-T) 12/05/1998, 02/01/1999, 04/15/1999, 12/31/1999   HPV 9-valent 01/01/2010, 04/04/2010, 08/07/2010   HPV Quadrivalent 01/01/2010, 04/04/2010, 08/07/2010   Hepatitis A, Adult 12/23/2004, 10/13/2005   Hepatitis A, Ped/Adol-2 Dose 12/23/2004, 10/13/2005   Hepatitis  B, PED/ADOLESCENT 2000-07-18, 10/30/1998, 07/01/1999   IPV 12/05/1998, 02/01/1999, 04/15/1999, 12/20/2002   Influenza Split 11/10/2011, 11/12/2012, 11/09/2013, 10/14/2018   Influenza,inj,Quad PF,6+ Mos 10/14/2018, 10/01/2021   Influenza,inj,Quad PF,6-35 Mos 11/25/2016   Influenza,inj,quad, With Preservative 10/03/2014   Influenza-Unspecified 11/25/2016   MMR 09/30/1999, 12/20/2002   Meningococcal B, OMV 11/25/2016, 01/29/2017   Meningococcal Conjugate 01/01/2010, 10/03/2014   Meningococcal Mcv4o 01/01/2010, 10/03/2014   Moderna Sars-Covid-2 Vaccination 02/14/2019, 04/03/2019   Pneumococcal Polysaccharide-23 12/05/1998, 02/01/1999, 04/15/1999, 12/31/1999   Pneumococcal-Unspecified 12/05/1998, 02/01/1999, 04/15/1999, 12/31/1999   Td 05/21/2009, 07/28/2019   Tdap 05/21/2009   Varicella 09/30/1999, 12/23/2004     Objective: Vital Signs: BP 129/84 (BP Location: Right Arm, Patient Position: Sitting, Cuff Size: Large)   Pulse (!) 105   Resp 14   Ht $R'5\' 2"'RZ$  (1.575 m)   Wt 300 lb (136.1 kg)   LMP  (LMP Unknown)   BMI 54.87 kg/m    Physical Exam Constitutional:      Appearance: She is obese.  Cardiovascular:     Rate and Rhythm: Regular rhythm. Tachycardia present.  Pulmonary:     Effort: Pulmonary effort is normal.     Breath sounds: Normal breath sounds.  Skin:    General: Skin is warm and dry.     Findings: No rash.  Neurological:     Mental Status: She is alert.      Musculoskeletal Exam:  Elbows full ROM no tenderness or swelling Wrists full ROM no tenderness or swelling Fingers full ROM no tenderness or swelling Some low back paraspinal muscle tenderness to pressure, some back pain provoked with passive hip range of motion Tender to pressure over lateral hips tenderness at knee joint line and ankles both with pressure and passive range of motion   Investigation: No additional findings.  Imaging: No results found.  Recent Labs: Lab Results  Component Value Date    WBC 8.5 11/07/2021   HGB 12.8 11/07/2021   PLT 377.0 10/07/2021   NA 136 10/07/2021   K 4.5 10/07/2021   CL 103 10/07/2021   CO2 22 10/07/2021   GLUCOSE 87 10/07/2021   BUN 18 10/07/2021   CREATININE 0.75 10/07/2021   BILITOT 0.4 10/07/2021   ALKPHOS 33 (L) 10/07/2021   AST 11 10/07/2021   ALT 11 10/07/2021   PROT 6.9 10/07/2021   ALBUMIN 3.9 10/07/2021   CALCIUM 9.3 10/07/2021    Speciality Comments: No specialty comments available.  Procedures:  No procedures performed Allergies: Dust mite extract   Assessment / Plan:     Visit Diagnoses: Chronic left-sided low back pain without sciatica Chronic midline thoracic back pain  Chronic fatigue - Plan: Ambulatory referral to Physical Medicine Rehab  Diffuse pain especially throughout the back hips and legs not well localized to the joints but  over much of the muscles with tenderness to light pressure in the chronic fatigue seems most suspicious for fibromyalgia syndrome.  She is already on Cymbalta with which I agree continuing treatment.  Will refer to physical medicine and rehabilitation clinic for additional evaluation of suspected fibromyalgia or myofascial pain syndrome and for additional symptom management recommendations.  Elevated sed rate Elevated C-reactive protein (CRP)  Persistent low elevations in serum inflammatory markers but no other serology exam or imaging findings correspond with this.  CRP elevation may be seen without identifiable underlying pathology and an increased proportion of obese patients and she has had considerable unintentional weight gain in the past few years.  Otherwise would just monitor conservatively at this time without clear evidence of active inflammatory process.   Orders: Orders Placed This Encounter  Procedures   Ambulatory referral to Physical Medicine Rehab   No orders of the defined types were placed in this encounter.    Follow-Up Instructions: Return in about 6 months  (around 04/24/2022), or if symptoms worsen or fail to improve.   Collier Salina, MD  Note - This record has been created using [employer].  Chart creation errors have been sought, but may not always  have been located. Such creation errors do not reflect on  the standard of medical care.

## 2021-10-23 ENCOUNTER — Ambulatory Visit: Payer: BC Managed Care – PPO | Attending: Internal Medicine | Admitting: Internal Medicine

## 2021-10-23 ENCOUNTER — Encounter: Payer: Self-pay | Admitting: Internal Medicine

## 2021-10-23 VITALS — BP 129/84 | HR 105 | Resp 14 | Ht 62.0 in | Wt 300.0 lb

## 2021-10-23 DIAGNOSIS — M545 Low back pain, unspecified: Secondary | ICD-10-CM | POA: Diagnosis not present

## 2021-10-23 DIAGNOSIS — R7982 Elevated C-reactive protein (CRP): Secondary | ICD-10-CM

## 2021-10-23 DIAGNOSIS — G8929 Other chronic pain: Secondary | ICD-10-CM

## 2021-10-23 DIAGNOSIS — R700 Elevated erythrocyte sedimentation rate: Secondary | ICD-10-CM | POA: Diagnosis not present

## 2021-10-23 DIAGNOSIS — M546 Pain in thoracic spine: Secondary | ICD-10-CM

## 2021-10-23 DIAGNOSIS — R5382 Chronic fatigue, unspecified: Secondary | ICD-10-CM

## 2021-10-24 DIAGNOSIS — F331 Major depressive disorder, recurrent, moderate: Secondary | ICD-10-CM | POA: Diagnosis not present

## 2021-10-24 DIAGNOSIS — F509 Eating disorder, unspecified: Secondary | ICD-10-CM | POA: Diagnosis not present

## 2021-10-24 DIAGNOSIS — F439 Reaction to severe stress, unspecified: Secondary | ICD-10-CM | POA: Diagnosis not present

## 2021-10-28 ENCOUNTER — Encounter: Payer: Self-pay | Admitting: Family

## 2021-10-29 ENCOUNTER — Other Ambulatory Visit: Payer: Self-pay | Admitting: Family

## 2021-10-29 DIAGNOSIS — N946 Dysmenorrhea, unspecified: Secondary | ICD-10-CM

## 2021-10-29 MED ORDER — LEVONORGESTREL-ETHINYL ESTRAD 0.15-30 MG-MCG PO TABS: 1.0000 | ORAL_TABLET | Freq: Every day | ORAL | 1 refills | Status: DC

## 2021-10-31 DIAGNOSIS — F439 Reaction to severe stress, unspecified: Secondary | ICD-10-CM | POA: Diagnosis not present

## 2021-10-31 DIAGNOSIS — F331 Major depressive disorder, recurrent, moderate: Secondary | ICD-10-CM | POA: Diagnosis not present

## 2021-10-31 DIAGNOSIS — F509 Eating disorder, unspecified: Secondary | ICD-10-CM | POA: Diagnosis not present

## 2021-11-05 ENCOUNTER — Ambulatory Visit: Payer: BC Managed Care – PPO | Admitting: Allergy & Immunology

## 2021-11-06 ENCOUNTER — Encounter: Payer: Self-pay | Admitting: Physical Medicine & Rehabilitation

## 2021-11-07 ENCOUNTER — Ambulatory Visit (INDEPENDENT_AMBULATORY_CARE_PROVIDER_SITE_OTHER): Payer: BC Managed Care – PPO | Admitting: Allergy & Immunology

## 2021-11-07 ENCOUNTER — Encounter: Payer: Self-pay | Admitting: Allergy & Immunology

## 2021-11-07 VITALS — BP 104/68 | HR 95 | Temp 97.9°F | Resp 18 | Ht 62.0 in | Wt 301.4 lb

## 2021-11-07 DIAGNOSIS — J310 Chronic rhinitis: Secondary | ICD-10-CM

## 2021-11-07 DIAGNOSIS — J45998 Other asthma: Secondary | ICD-10-CM | POA: Diagnosis not present

## 2021-11-07 DIAGNOSIS — J4540 Moderate persistent asthma, uncomplicated: Secondary | ICD-10-CM

## 2021-11-07 MED ORDER — BREZTRI AEROSPHERE 160-9-4.8 MCG/ACT IN AERO: 2.0000 | INHALATION_SPRAY | Freq: Two times a day (BID) | RESPIRATORY_TRACT | 5 refills | Status: DC

## 2021-11-07 MED ORDER — XHANCE 93 MCG/ACT NA EXHU: 1.0000 | INHALANT_SUSPENSION | Freq: Two times a day (BID) | NASAL | 5 refills | Status: DC

## 2021-11-07 NOTE — Patient Instructions (Addendum)
1. Mild intermittent asthma, uncomplicated - Lung testing looked good today. - Spacer demonstration provided. - Daily controller medication(s): Breztri two puffs twice daily (CONTAINS three medicines to help with your breathing) - Prior to physical activity: albuterol 2 puffs 10-15 minutes before physical activity. - Rescue medications: albuterol 4 puffs every 4-6 hours as needed - Asthma control goals:  * Full participation in all desired activities (may need albuterol before activity) * Albuterol use two time or less a week on average (not counting use with activity) * Cough interfering with sleep two time or less a month * Oral steroids no more than once a year * No hospitalizations  2. Chronic non-allergic rhinitis -- We are going to get some lab work to see whether we can catch some allergies that way (you were negative on skin testing two years ago, but thinks change).  - Stop taking: Flonase (fluticasone) and Zyrtec - Start taking: Xhance one spray per nostril twice daily AND Xyzal 5mg  daily  - Truett Perna is a steroid but it gets deeper into your sinuses to make magic happen.  - You can take an extra dose of antihistamines if needed on bad days.   3. Return in about 4 weeks (around 12/05/2021).    Please inform us of any Emergency Department visits, hospitalizations, or changes in symptoms. Call us before going to the ED for breathing or allergy symptoms since we might be able to fit you in for a sick visit. Feel free to contact us anytime with any questions, problems, or concerns.  It was a pleasure to see you again today!  Websites that have reliable patient information: 1. American Academy of Asthma, Allergy, and Immunology: www.aaaai.org 2. Food Allergy Research and Education (FARE): foodallergy.org 3. Mothers of Asthmatics: http://www.asthmacommunitynetwork.org 4. American College of Allergy, Asthma, and Immunology: www.acaai.org   COVID-19 Vaccine Information can be found  at: ShippingScam.co.uk For questions related to vaccine distribution or appointments, please email [email or web address] or call [phone].   We realize that you might be concerned about having an allergic reaction to the COVID19 vaccines. To help with that concern, WE ARE OFFERING THE COVID19 VACCINES IN OUR OFFICE! Ask the front desk for dates!     "Like" [place] on Facebook and Instagram for our latest updates!      A healthy democracy works best when [employer] participate! Make sure you are registered to vote! If you have moved or changed any of your contact information, you will need to get this updated before voting!  In some cases, you MAY be able to register to vote online: [email or web address]

## 2021-11-07 NOTE — Progress Notes (Signed)
FOLLOW UP  Date of Service/Encounter:  11/07/21   Assessment:   Moderate persistent asthma, uncomplicated    Chronic non-allergic rhinitis  Keratosis pilaris  Pustular lesions - just popped up over the last few day   Chronic pain - followed by Dr. Dimple Casey  Depression - s/p multiple medication changes and improved with TMS   Plan/Recommendations:   1. Moderate persistent asthma, uncomplicated - Lung testing looked good today. - Spacer demonstration provided. - Daily controller medication(s): Breztri two puffs twice daily (CONTAINS three medicines to help with your breathing) - Prior to physical activity: albuterol 2 puffs 10-15 minutes before physical activity. - Rescue medications: albuterol 4 puffs every 4-6 hours as needed - Asthma control goals:  * Full participation in all desired activities (may need albuterol before activity) * Albuterol use two time or less a week on average (not counting use with activity) * Cough interfering with sleep two time or less a month * Oral steroids no more than once a year * No hospitalizations  2. Chronic non-allergic rhinitis -- We are going to get some lab work to see whether we can catch some allergies that way (you were negative on skin testing two years ago, but thinks change).  - Stop taking: Flonase (fluticasone) and Zyrtec - Start taking: Xhance one spray per nostril twice daily AND Xyzal 5mg  daily  - is a steroid but it gets deeper into your sinuses to make magic happen.  - You can take an extra dose of antihistamines if needed on bad days.   3. Return in about 4 weeks (around 12/05/2021).   Subjective:   Emily [name] is a 21 y.o. female presenting today for follow up of  Chief Complaint  Patient presents with   Wheezing   Nasal Congestion    Emily [name] has a history of the following: Patient Active Problem List   Diagnosis Date Noted   Recurrent major depression resistant to treatment  ([facility]) 04/17/2021   Essential hypertension 04/17/2021   Acne rosacea 01/17/2021   Elevated C-reactive protein (CRP) 12/11/2020   Elevated sed rate 12/11/2020   Arthralgia 11/27/2020   Social anxiety disorder 08/02/2020   B12 deficiency 06/05/2020   Dry eye 05/25/2020   Vitamin D deficiency 05/25/2020   Epistaxis, recurrent 01/05/2020   Ankle weakness 12/01/2019   Mixed hyperlipidemia 12/01/2019   Paresthesia of skin 05/27/2019   BMI 50.0-59.9, adult ([facility]) 05/12/2019   Chronic fatigue 05/12/2019   Chronic left-sided low back pain without sciatica 12/02/2017   Interstitial cystitis (chronic) with hematuria 11/26/2017   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  Emily [name] is a 21 y.o. female presenting for a follow up visit.  She was last seen in August 2021.  At that time, lung testing looked good.  We did not make any changes.  We continue with albuterol as needed.  We did not feel like a controller was necessary.  She underwent testing that was negative to the entire panel.  We started Zyrtec and Flonase, emphasizing to use it on exposure to cats.  Since the last visit, she unfortunately has had a terrible bout of treatment resistent depression. She underwent treatments with TMS (  transcranial magnetic stimulation), which she did this daily for two months. She has been changing medications and making a lot of other changes as well. She is back in school and is doing her second year at [employer]. She is going to do two classes this semester to ease herself back in. She is now more than halfway through her degree.   Asthma/Respiratory Symptom History: Asthma  is not well controlled at this point. She has not been feeling great. She has to be "smart" about what she can do. She had a pregnant friend who she helped bring up water to a second floor apartment. Cold weather makes it a lot worse. Anytime that she has some physical exertion more than standing up, she will have a tight chest.  She thinks that this got worse. Today is a fairly good day, but she did park in the wrong spot and had to walk up the hill.   She has been on a daily medicine when she was younger. She did have multiple episodes where she required nebulizer. But she went several years without a problem.   Allergic Rhinitis Symptom History: She has been on Flonase for an extended period of time. She did do fine during the summer but now with the dry air, it has worsened a lot.  She has some eye drops, but they are not helping. She was going to be placed on Restasis.   She has had a recent workup for autoimmune disease. He follows with Dr. Benjamine Mola. He is considering seeing pain management. She has not been placed on prednisone for her autoimmune issues. She has been on this intermittently and she always feels great with it. But she knows that she cannot stay on it long term. Evidently if pain management is not successful, Dr. Benjamine Mola is planning to put her on Humira. This is Emily [name]'s understanding of the plan, anyway,   She completed her history degree from [employer]. She is now attending [employer].   Otherwise, there have been no changes to her past medical history, surgical history, family history, or social history.    Review of Systems  Constitutional: Negative.  Negative for chills, fever, malaise/fatigue and weight loss.  HENT:  Positive for congestion. Negative for ear discharge, ear pain and sinus pain.   Eyes:  Negative for pain, discharge and redness.       Positive of ocular pruritis.   Respiratory:  Positive for cough, sputum production and shortness of breath. Negative for  wheezing.   Cardiovascular: Negative.  Negative for chest pain and palpitations.  Gastrointestinal:  Negative for abdominal pain, constipation, diarrhea, heartburn, nausea and vomiting.  Skin: Negative.  Negative for itching and rash.  Neurological:  Negative for dizziness and headaches.  Endo/Heme/Allergies:  Positive for environmental allergies. Does not bruise/bleed easily.       Objective:   Blood pressure 104/68, pulse 95, temperature 97.9 F (36.6 C), temperature source Temporal, resp. rate 18, height 5\' 2"  (1.575 m), weight (!) 301 lb 6.4 oz (136.7 kg), SpO2 99 %. Body mass index is 55.13 kg/m.    Physical Exam Vitals reviewed.  Constitutional:      Appearance: She is well-developed. She is obese.     Comments: Talkative pleasant female.   HENT:     Head: Normocephalic and atraumatic.     Right Ear: Tympanic membrane, ear canal and external ear normal. No drainage, swelling or tenderness. Tympanic membrane is not injected, scarred, erythematous, retracted or bulging.  Left Ear: Tympanic membrane, ear canal and external ear normal. No drainage, swelling or tenderness. Tympanic membrane is not injected, scarred, erythematous, retracted or bulging.     Nose: No nasal deformity, septal deviation, mucosal edema or rhinorrhea.     Right Turbinates: Enlarged, swollen and pale.     Left Turbinates: Enlarged, swollen and pale.     Right Sinus: No maxillary sinus tenderness or frontal sinus tenderness.     Left Sinus: No maxillary sinus tenderness or frontal sinus tenderness.     Comments: Turbinates enlarged bilaterally.    Mouth/Throat:     Mouth: Mucous membranes are not pale and not dry.     Pharynx: Uvula midline.     Comments: Cobblestoning present in the posterior oropharynx. Tonsils enlarged. No exudates. Eyes:     General:        Right eye: No discharge.        Left eye: No discharge.     Conjunctiva/sclera: Conjunctivae normal.     Right eye: Right conjunctiva is  not injected. No chemosis.    Left eye: Left conjunctiva is not injected. No chemosis.    Pupils: Pupils are equal, round, and reactive to light.  Cardiovascular:     Rate and Rhythm: Normal rate and regular rhythm.     Heart sounds: Normal heart sounds.  Pulmonary:     Effort: Pulmonary effort is normal. No tachypnea, accessory muscle usage or respiratory distress.     Breath sounds: Normal breath sounds. No wheezing, rhonchi or rales.     Comments: Moving air well in all lung fields. No increased work of breathing noted.  Chest:     Chest wall: No tenderness.  Abdominal:     Tenderness: There is no abdominal tenderness. There is no guarding or rebound.  Lymphadenopathy:     Head:     Right side of head: No submandibular, tonsillar or occipital adenopathy.     Left side of head: No submandibular, tonsillar or occipital adenopathy.     Cervical: No cervical adenopathy.  Skin:    Coloration: Skin is not pale.     Findings: No abrasion, erythema, petechiae or rash. Rash is not papular, urticarial or vesicular.     Comments: No eczematous lesions noted.   Neurological:     Mental Status: She is alert.  Psychiatric:        Behavior: Behavior is cooperative.      Diagnostic studies:    Spirometry: results normal (FEV1: 2.82/92%, FVC: 3.37/96%, FEV1/FVC: 84%).    Spirometry consistent with normal pattern.   Allergy Studies: none       [name], MD  [facility]

## 2021-11-08 ENCOUNTER — Encounter: Payer: Self-pay | Admitting: Allergy & Immunology

## 2021-11-10 LAB — ALLERGENS W/COMP RFLX AREA 2
Alternaria Alternata IgE: <0.1 kU/L
Aspergillus Fumigatus IgE: <0.1 kU/L
Bermuda Grass IgE: <0.1 kU/L
Cedar, Mountain IgE: <0.1 kU/L
Cladosporium Herbarum IgE: <0.1 kU/L
Cockroach, German IgE: <0.1 kU/L
Common Silver Birch IgE: <0.1 kU/L
Cottonwood IgE: <0.1 kU/L
D Farinae IgE: 0.28 kU/L — AB
D Pteronyssinus IgE: <0.1 kU/L
E001-IgE Cat Dander: <0.1 kU/L
E005-IgE Dog Dander: <0.1 kU/L
Elm, American IgE: <0.1 kU/L
IgE (Immunoglobulin E), Serum: 17 IU/mL (ref 6–495)
Johnson Grass IgE: <0.1 kU/L
Maple/Box Elder IgE: <0.1 kU/L
Mouse Urine IgE: <0.1 kU/L
Oak, White IgE: <0.1 kU/L
Pecan, Hickory IgE: <0.1 kU/L
Penicillium Chrysogen IgE: <0.1 kU/L
Pigweed, Rough IgE: <0.1 kU/L
Ragweed, Short IgE: <0.1 kU/L
Sheep Sorrel IgE Qn: <0.1 kU/L
Timothy Grass IgE: <0.1 kU/L
White Mulberry IgE: <0.1 kU/L

## 2021-11-10 LAB — CBC WITH DIFFERENTIAL
Basophils Absolute: 0 10*3/uL (ref 0.0–0.2)
Basos: 0 %
EOS (ABSOLUTE): 0.1 10*3/uL (ref 0.0–0.4)
Eos: 1 %
Hematocrit: 38.7 % (ref 34.0–46.6)
Hemoglobin: 12.8 g/dL (ref 11.1–15.9)
Immature Grans (Abs): 0 10*3/uL (ref 0.0–0.1)
Immature Granulocytes: 0 %
Lymphocytes Absolute: 2.8 10*3/uL (ref 0.7–3.1)
Lymphs: 33 %
MCH: 30.7 pg (ref 26.6–33.0)
MCHC: 33.1 g/dL (ref 31.5–35.7)
MCV: 93 fL (ref 79–97)
Monocytes Absolute: 0.8 10*3/uL (ref 0.1–0.9)
Monocytes: 9 %
Neutrophils Absolute: 4.7 10*3/uL (ref 1.4–7.0)
Neutrophils: 57 %
RBC: 4.17 x10E6/uL (ref 3.77–5.28)
RDW: 13.6 % (ref 11.7–15.4)
WBC: 8.5 10*3/uL (ref 3.4–10.8)

## 2021-11-12 DIAGNOSIS — F411 Generalized anxiety disorder: Secondary | ICD-10-CM | POA: Diagnosis not present

## 2021-11-12 DIAGNOSIS — F341 Dysthymic disorder: Secondary | ICD-10-CM | POA: Diagnosis not present

## 2021-11-12 DIAGNOSIS — F4010 Social phobia, unspecified: Secondary | ICD-10-CM | POA: Diagnosis not present

## 2021-11-22 DIAGNOSIS — F509 Eating disorder, unspecified: Secondary | ICD-10-CM | POA: Diagnosis not present

## 2021-11-22 DIAGNOSIS — F439 Reaction to severe stress, unspecified: Secondary | ICD-10-CM | POA: Diagnosis not present

## 2021-11-22 DIAGNOSIS — F331 Major depressive disorder, recurrent, moderate: Secondary | ICD-10-CM | POA: Diagnosis not present

## 2021-11-25 ENCOUNTER — Encounter
Payer: BC Managed Care – PPO | Attending: Physical Medicine & Rehabilitation | Admitting: Physical Medicine & Rehabilitation

## 2021-11-25 ENCOUNTER — Encounter: Payer: Self-pay | Admitting: Physical Medicine & Rehabilitation

## 2021-11-25 VITALS — BP 119/80 | HR 104 | Ht 62.0 in | Wt 297.8 lb

## 2021-11-25 DIAGNOSIS — F329 Major depressive disorder, single episode, unspecified: Secondary | ICD-10-CM | POA: Insufficient documentation

## 2021-11-25 DIAGNOSIS — M797 Fibromyalgia: Secondary | ICD-10-CM | POA: Insufficient documentation

## 2021-11-25 MED ORDER — PREGABALIN 75 MG PO CAPS: 75.0000 mg | ORAL_CAPSULE | Freq: Two times a day (BID) | ORAL | 2 refills | Status: DC

## 2021-11-25 NOTE — Progress Notes (Signed)
Subjective:    Patient ID: Emily [name], female    DOB: 2000/08/04, 21 y.o.   MRN: [id number]  HPI Emily [name] is a 21 y.o. year old female  who  has a past medical history of ADHD (attention deficit hyperactivity disorder), Anorexia, Anxiety, Asthma, B12 deficiency, Bulimia, Depression, Morbid obesity ([place]), and Treatment-resistant depression.   They are presenting to PM&R clinic as a new patient for pain management evaluation.  Emily [name] has pain that is worse in her back that is stabbing aching in quality.  She has had this pain for about 8 years.  Pain interferes with her sleep.  While her back is the worst source of her pain she has pain throughout her entire body.  Gentle stretching will improve her pain.   Pain is worsened by sitting.  Pain is also increased after she stands for several minutes.  Her activity is decreased due to the pain.  She reports she would like to avoid narcotic pain medications if possible.  She reports that she has had family members who have been addicted to these types of medications.  She has been followed by rheumatology due to ESR and CRP elevations however other tests have been negative.  It does not appear that she has yet been diagnosed with any specific rheumatological condition.  She has a lot of fatigue and has insomnia with significant difficulty falling and staying asleep.  She will often have paresthesias in her arms and legs.  She has a history of depression and is on Cymbalta.  She also had [name] in September with benefit to her mood.  No SI or HI.  No bowel or bladder changes although she has chronic interstitial cystitis.   Medications tried: -Duloxetine 86m BID - currently using for mood -Flexeril- helped at first, not much now -Naproxone-helped at first, not much now -Tylenol- no benefit -Ibuprofen- no benefit -Oxycodone for foot surgery helped her foot pain at the time -Prednisione used in the the past with limited  benefit  Other treatments: Physical therapy did not provide significant benefit  Pain Inventory Average Pain 6 Pain Right Now 6 My pain is burning, dull, stabbing, and aching  In the last 24 hours, has pain interfered with the following? General activity 8 Relation with others 7 Enjoyment of life 8 What TIME of day is your pain at its worst? evening and night Sleep (in general) Poor  Pain is worse with: walking, bending, sitting, inactivity, and standing Pain improves with: rest and therapy/exercise Relief from Meds: 4  walk without assistance ability to climb steps?  yes do you drive?  yes Do you have any goals in this area?  yes  employed # of hrs/week . 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  yes  weakness dizziness confusion depression anxiety  Any changes since last visit?  no  Any changes since last visit?  no    Family History  Problem Relation Age of Onset   Anxiety disorder Mother    Drug abuse Father    Hyperlipidemia Maternal Grandmother    Hypertension Maternal Grandmother    Hyperlipidemia Maternal Grandfather    Hypertension Maternal Grandfather    Colon cancer Neg Hx    Pancreatic cancer Neg Hx    Esophageal cancer Neg Hx    Social History   Socioeconomic History   Marital status: Single    Spouse name: Not on file   Number of children:  Not on file   Years of education: Not on file   Highest education level: Not on file  Occupational History   Not on file  Tobacco Use   Smoking status: Never    Passive exposure: Yes   Smokeless tobacco: Never   Tobacco comments:    mother smokes outside home  Vaping Use   Vaping Use: Never used  Substance and Sexual Activity   Alcohol use: Yes    Alcohol/week: 0.0 standard drinks of alcohol    Comment: occasional   Drug use: No   Sexual activity: Never    Partners: Male    Comment: Female partners  Other Topics Concern   Not on file  Social History Narrative    Not on file   Social Determinants of Health   Financial Resource Strain: Not on file  Food Insecurity: Not on file  Transportation Needs: Not on file  Physical Activity: Not on file  Stress: Not on file  Social Connections: Not on file   Past Surgical History:  Procedure Laterality Date   ADENOIDECTOMY     BLADDER REPAIR     LIGAMENT REPAIR     TONSILLECTOMY     Past Medical History:  Diagnosis Date   ADHD (attention deficit hyperactivity disorder)    Anorexia    Anxiety    Asthma    B12 deficiency    Bulimia    Depression    Morbid obesity ([place])    Treatment-resistant depression    BP 119/80   Pulse (!) 104   Ht _0  (1.575 m)   Wt 297 lb 12.8 oz (135.1 kg)   SpO2 96%   BMI 54.47 kg/m   Opioid Risk Score:   Fall Risk Score:  `1  Depression screen [facility] 2/9     11/25/2021    2:51 PM 10/01/2021    2:14 PM 03/22/2021   11:20 AM 11/27/2020   11:46 AM 08/02/2020    5:00 PM 04/25/2019    3:48 PM 03/04/2018    2:22 PM  Depression screen PHQ 2/9  Decreased Interest _1 Down, Depressed, Hopeless _2 PHQ - 2 Score _3 Altered sleeping _4 0  Tired, decreased energy _5 Change in appetite _6 Feeling bad or failure about yourself  0 _7 0  Trouble concentrating 1 2 0 1 0 1 2  Moving slowly or fidgety/restless 0 0 1 0 0 0 0  Suicidal thoughts 0 0     0  PHQ-9 Score _8 Difficult doing work/chores Somewhat difficult Somewhat difficult          Review of Systems  Constitutional:  Positive for appetite change.       Poor  HENT: Negative.    Eyes: Negative.   Respiratory:  Positive for shortness of breath.   Cardiovascular: Negative.   Gastrointestinal: Negative.   Endocrine: Negative.   Genitourinary: Negative.   Musculoskeletal:  Positive for back pain.       Hip pain  Skin:  Positive for rash.  Allergic/Immunologic: Positive for environmental allergies.       Dust mites   Neurological:  Positive for dizziness and weakness.  Hematological: Negative.   Psychiatric/Behavioral:  Positive for confusion  and dysphoric mood. The patient is nervous/anxious.   All other systems reviewed and are negative.      Objective:   Physical Exam   Gen: no distress, normal appearing HEENT: oral mucosa pink and moist, NCAT Chest: normal effort, normal rate of breathing Abd: soft, non-distended Ext: no edema Psych: pleasant, normal affect Skin: intact Neuro: Alert and awake, follows commands, sensation intact light touch in all 4 extremities, strength 5 out of 5 in all 4 extremities, cranial nerves II through XII intact DTR normal and symmetric Finger-nose intact bilaterally Musculoskeletal: SLR negative, FABER and FADIR negative however resulted in back pain No joint swelling noted Tenderness throughout periscapular muscles Tenderness throughout paraspinal muscles Tenderness at bilateral SI joints and QL Tenderness at bilateral shoulders, elbows, left wrist Tenderness at bilateral hips, knees, ankles   10/13/21 CLINICAL DATA:  Low back pain. 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 Low back pain radiating to both legs, worsening over the last year.   EXAM: MRI LUMBAR SPINE WITHOUT AND WITH CONTRAST   TECHNIQUE: Multiplanar and multiecho pulse sequences of the lumbar spine were obtained without and with intravenous contrast.   CONTRAST:  10 cc view way.   COMPARISON:  Radiography 02/13/2021   FINDINGS: Segmentation: Transitional lumbosacral anatomy. Transitional vertebra is labeled as S1.   Alignment:  Minimal scoliotic curvature convex to the left.   Vertebrae:  No fracture or focal bone lesion.   Conus medullaris and cauda equina: Conus extends to the L1 level. Conus and cauda equina appear normal.   Paraspinal and other soft tissues: Normal   Disc levels:   Disc levels are normal. No disc degeneration, bulge or herniation. No stenosis of the canal or  foramina. No advanced facet arthropathy. Very mild facet joint degeneration and enhancement at L5-S1.   IMPRESSION: 1. Transitional lumbosacral anatomy. Transitional vertebra is labeled as S1. 2. Minimal scoliotic curvature convex to the left. 3. No disc degeneration or herniation. No stenosis of the canal or foramina. 4. Very mild facet degeneration and enhancement at L5-S1. This could relate to low back pain or referred facet syndrome pain.       Assessment & Plan:   Fibromyalgia -Currently suspect fibromyalgia is the most likely diagnosis.  She has had a extensive work-up by rheumatology and it does not appear that any abnormalities noted other than elevated ESR and CRP.  Presentation does not seem typical of polymyalgia rheumatica.  Unless alternative rheumatological condition is identified I think fibromyalgia is the most likely condition resulting in her body wide pain she also has associated depression and sleep disturbance. -She is already on max dose of Cymbalta would continue this for now as it may be benefiting her pain -We will try Lyrica 75 mg twice daily for suspected fibromyalgia -Discussed low impact very gradual progressive exercise -Discussed and provided list of foods to assist with pain control  Depression -She is currently on duloxetine 60 mg twice daily, this may be providing benefit for her pain is well - If change in therapy is considered by her mental health providers other SNRIs or TCA medications may be good options at least from the perspective of pain control

## 2021-11-27 DIAGNOSIS — F411 Generalized anxiety disorder: Secondary | ICD-10-CM | POA: Diagnosis not present

## 2021-11-27 DIAGNOSIS — F341 Dysthymic disorder: Secondary | ICD-10-CM | POA: Diagnosis not present

## 2021-11-28 DIAGNOSIS — F331 Major depressive disorder, recurrent, moderate: Secondary | ICD-10-CM | POA: Diagnosis not present

## 2021-11-28 DIAGNOSIS — F439 Reaction to severe stress, unspecified: Secondary | ICD-10-CM | POA: Diagnosis not present

## 2021-11-28 DIAGNOSIS — F509 Eating disorder, unspecified: Secondary | ICD-10-CM | POA: Diagnosis not present

## 2021-11-29 NOTE — Patient Instructions (Incomplete)
1. Moderate persistent asthma - Daily controller medication(s): Breztri two puffs twice daily with spacer to help prevent cough and wheeze. Rinse mouth - Prior to physical activity: albuterol 2 puffs 10-15 minutes before physical activity. - Rescue medications: albuterol 4 puffs every 4-6 hours as needed - Asthma control goals:  * Full participation in all desired activities (may need albuterol before activity) * Albuterol use two time or less a week on average (not counting use with activity) * Cough interfering with sleep two time or less a month * Oral steroids no more than once a year * No hospitalizations  2. Perennial allergic rhinitis - lab testing on  11/07/21 positive to dust mite - Continue  Xhance one spray per nostril twice daily for stuffy nose AND start Xyzal 5mg  daily as needed for runny nose - You can take an extra dose of antihistamines if needed on bad days.   3. Schedule a follow up appointment in 4 months or sooner if needed

## 2021-12-02 ENCOUNTER — Telehealth: Payer: Self-pay

## 2021-12-02 ENCOUNTER — Encounter: Payer: Self-pay | Admitting: Family

## 2021-12-02 ENCOUNTER — Other Ambulatory Visit: Payer: Self-pay

## 2021-12-02 ENCOUNTER — Ambulatory Visit (INDEPENDENT_AMBULATORY_CARE_PROVIDER_SITE_OTHER): Payer: BC Managed Care – PPO | Admitting: Family

## 2021-12-02 VITALS — BP 122/74 | HR 92 | Temp 98.8°F | Resp 18 | Ht 62.0 in | Wt 301.0 lb

## 2021-12-02 DIAGNOSIS — J4540 Moderate persistent asthma, uncomplicated: Secondary | ICD-10-CM | POA: Diagnosis not present

## 2021-12-02 DIAGNOSIS — J3089 Other allergic rhinitis: Secondary | ICD-10-CM | POA: Diagnosis not present

## 2021-12-02 NOTE — Telephone Encounter (Signed)
PA received via CMM for Xhance 93MCG/ACT exhaler suspension through [place].  PA has been submitted and is awaiting determination.  Key: [id number] - PA Case ID: [id number]

## 2021-12-02 NOTE — Progress Notes (Signed)
[address] [place] [address] Dept: [phone]  FOLLOW UP NOTE  Patient ID: Emily [name], female    DOB: 11/29/2000  Age: 21 y.o. MRN: [id number] Date of Office Visit: 12/02/2021  Assessment  Chief Complaint: Asthma (4 wk f/u - A lot Better) and Chronic Non-Allergic rhinitis (4 wk f/u - Pretty Good. Patient stated she has forgotten to do the twice a day with the Xhance sometimes.)  HPI Emily [name] is a 21 year old female who presents today for follow-up of moderate persistent asthma, perennial allergic rhinitis, and a history of keratosis pilaris, pustular lesions-just popped up over the last few days, chronic pain followed by Dr. Dimple Casey, and depression-status post multiple medication changes improved with TMS.  She reports since her last office visit she has started taking Lyrica.  She also went to the doctor of physical medicine and was told she had fibromyalgia, but it was not added as a diagnosis.  Moderate persistent asthma: She reports her asthma is  a lot better and she is now able to breathe so deep.  She continues to take Breztri 2 puffs twice a day with spacer and albuterol as needed.  She reports tightness in her chest only if she forgets to take a dose of her Breztri.  She denies cough, wheeze, shortness of breath, and nocturnal awakenings due to breathing problems.  Since her last office visit she has not required any systemic steroids or made any trips to the emergency room or urgent care due to breathing problems.  She used her albuterol inhaler as precautionary before [place] [employer] [facility].  She was excited that she did not have to use it while doing all the walking at the science center.  Perennial allergic rhinitis: She is currently taking Xhance 1 spray each nostril 1-2 times a day and has not started Xyzal 5 mg once a day because she has not picked it up yet.  She does feel like the Timmothy Sours has opened up her nostrils more.   She denies rhinorrhea, nasal congestion, and postnasal drip.  She has not had any sinus infections since we last saw her.   Drug Allergies:  Allergies  Allergen Reactions   Dust Mite Extract Other (See Comments)    Sneezing, coughing, runny nose Sneezing, coughing, runny nose     Review of Systems: Review of Systems  Constitutional:  Negative for chills and fever.  HENT:         Denies rhinorrhea, nasal congestion, and postnasal drip  Eyes:        Reports dry eyes and denies itchy watery eyes  Respiratory:  Negative for cough, shortness of breath and wheezing.        Reports shortness of breath only if she forgets to take a dose of her Breztri.  Denies coughing, wheezing, shortness of breath, and nocturnal awakenings due to breathing,  Cardiovascular:  Negative for chest pain and palpitations.  Gastrointestinal:        Reports heartburn symptoms today while on omeprazole.  She reports diagnosis of anorexia and binge  Genitourinary:  Positive for frequency.       Reports increased frequency of urination due to interstitial cystitis  Skin:  Negative for itching and rash.  Neurological:  Positive for headaches.       Reports headaches due to anorexia  Endo/Heme/Allergies:  Positive for environmental allergies.     Physical Exam: BP 122/74   Pulse 92   Temp  98.8 F (37.1 C)   Resp 18   Ht 5\' 2"  (1.575 m)   Wt (!) 301 lb (136.5 kg)   SpO2 97%   BMI 55.05 kg/m    Physical Exam Constitutional:      Appearance: Normal appearance.  HENT:     Head: Normocephalic and atraumatic.     Comments: Pharynx normal, eyes normal, ears normal, nose: Bilateral lower turbinates mildly edematous and slightly erythematous with no drainage noted    Right Ear: Tympanic membrane, ear canal and external ear normal.     Left Ear: Tympanic membrane, ear canal and external ear normal.     Mouth/Throat:     Mouth: Mucous membranes are moist.     Pharynx: Oropharynx is clear.  Eyes:      Conjunctiva/sclera: Conjunctivae normal.  Cardiovascular:     Rate and Rhythm: Regular rhythm.     Heart sounds: Normal heart sounds.  Pulmonary:     Effort: Pulmonary effort is normal.     Breath sounds: Normal breath sounds.     Comments: Lungs clear to auscultation Musculoskeletal:     Cervical back: Neck supple.  Skin:    General: Skin is warm.  Neurological:     Mental Status: She is alert and oriented to person, place, and time.  Psychiatric:        Mood and Affect: Mood normal.        Behavior: Behavior normal.        Thought Content: Thought content normal.        Judgment: Judgment normal.     Diagnostics: FVC 3.52 L (99%), FEV1 2.97 L (96%).  Predicted FVC 3.55 L, predicted FEV1 3.09 L.  Spirometry indicates normal respiratory function.  Assessment and Plan: 1. Asthma, well controlled, moderate persistent   2. Perennial allergic rhinitis     No orders of the defined types were placed in this encounter.   Patient Instructions  1. Moderate persistent asthma - Daily controller medication(s): Breztri two puffs twice daily with spacer to help prevent cough and wheeze. Rinse mouth - Prior to physical activity: albuterol 2 puffs 10-15 minutes before physical activity. - Rescue medications: albuterol 4 puffs every 4-6 hours as needed - Asthma control goals:  * Full participation in all desired activities (may need albuterol before activity) * Albuterol use two time or less a week on average (not counting use with activity) * Cough interfering with sleep two time or less a month * Oral steroids no more than once a year * No hospitalizations  2. Perennial allergic rhinitis - lab testing on  11/07/21 positive to dust mite - Continue  Xhance one spray per nostril twice daily for stuffy nose AND start Xyzal 5mg  daily as needed for runny nose - You can take an extra dose of antihistamines if needed on bad days.   3. Schedule a follow up appointment in 4 months or sooner  if needed      Return in about 4 months (around 04/02/2022), or if symptoms worsen or fail to improve.    Thank you for the opportunity to care for this patient.  Please do not hesitate to contact me with questions.  , FNP [facility]

## 2021-12-03 NOTE — Telephone Encounter (Signed)
PA has been DENIED for [facility].

## 2021-12-03 NOTE — Telephone Encounter (Signed)
Yes provider wants to use the j33.9 dx code thank you

## 2021-12-03 NOTE — Telephone Encounter (Signed)
PA resubmitted for [facility] with DX j33.9 via CMM. PA submitted and is awaiting response.   KeyCaroll Rancher - PA Case ID: [id number]

## 2021-12-03 NOTE — Telephone Encounter (Signed)
The provider would like to know if you used dx code J33.8

## 2021-12-03 NOTE — Telephone Encounter (Signed)
Did we try using diagnosis code J40.8?

## 2021-12-03 NOTE — Telephone Encounter (Signed)
That particular DX was not used as there is no mention of nasal polyps in patients chart, please include associated DX if it is different than the main DX listed in the chart.

## 2021-12-03 NOTE — Telephone Encounter (Signed)
Pa for xhance was denied please advise to change in therapy

## 2021-12-04 NOTE — Telephone Encounter (Signed)
Pa was still denied with j code

## 2021-12-04 NOTE — Telephone Encounter (Signed)
PA has still been DENIED with DX code J33.9

## 2021-12-09 NOTE — Telephone Encounter (Signed)
Called and left a voicemail asking for patient to return call to discuss.  °

## 2021-12-09 NOTE — Telephone Encounter (Signed)
Patient called back and was informed. Patient verbalized understanding.  °

## 2021-12-09 NOTE — Telephone Encounter (Signed)
Have her finish using Xhance and then we will transition to Nasacort 2 sprays in each nostril once a day as needed for stuffy nose. Nasacort will replace Xhance.

## 2021-12-09 NOTE — Telephone Encounter (Signed)
Please let Emily [name] know that Emily [name] was denied. Please find out what steroid nasal sprays she has tried: Flonase (fluticasone), Nasacort, Nasonex, etc

## 2021-12-09 NOTE — Telephone Encounter (Signed)
Called and spoke with patient and she stated that she did receive the first shipment from the pharmacy for [facility] but she didn't want to get a huge bill if it isn't going to be covered going forward. She states that she has tried flonase.

## 2021-12-12 DIAGNOSIS — F331 Major depressive disorder, recurrent, moderate: Secondary | ICD-10-CM | POA: Diagnosis not present

## 2021-12-12 DIAGNOSIS — F509 Eating disorder, unspecified: Secondary | ICD-10-CM | POA: Diagnosis not present

## 2021-12-12 DIAGNOSIS — F439 Reaction to severe stress, unspecified: Secondary | ICD-10-CM | POA: Diagnosis not present

## 2021-12-17 DIAGNOSIS — F331 Major depressive disorder, recurrent, moderate: Secondary | ICD-10-CM | POA: Diagnosis not present

## 2021-12-17 DIAGNOSIS — F509 Eating disorder, unspecified: Secondary | ICD-10-CM | POA: Diagnosis not present

## 2021-12-17 DIAGNOSIS — F439 Reaction to severe stress, unspecified: Secondary | ICD-10-CM | POA: Diagnosis not present

## 2021-12-19 DIAGNOSIS — Z713 Dietary counseling and surveillance: Secondary | ICD-10-CM | POA: Diagnosis not present

## 2021-12-24 ENCOUNTER — Other Ambulatory Visit: Payer: Self-pay | Admitting: Family

## 2021-12-26 DIAGNOSIS — F509 Eating disorder, unspecified: Secondary | ICD-10-CM | POA: Diagnosis not present

## 2021-12-26 DIAGNOSIS — F439 Reaction to severe stress, unspecified: Secondary | ICD-10-CM | POA: Diagnosis not present

## 2021-12-26 DIAGNOSIS — F331 Major depressive disorder, recurrent, moderate: Secondary | ICD-10-CM | POA: Diagnosis not present

## 2022-01-02 ENCOUNTER — Ambulatory Visit: Payer: BC Managed Care – PPO | Admitting: Physical Medicine & Rehabilitation

## 2022-01-02 DIAGNOSIS — F439 Reaction to severe stress, unspecified: Secondary | ICD-10-CM | POA: Diagnosis not present

## 2022-01-02 DIAGNOSIS — F509 Eating disorder, unspecified: Secondary | ICD-10-CM | POA: Diagnosis not present

## 2022-01-02 DIAGNOSIS — F331 Major depressive disorder, recurrent, moderate: Secondary | ICD-10-CM | POA: Diagnosis not present

## 2022-01-14 DIAGNOSIS — F411 Generalized anxiety disorder: Secondary | ICD-10-CM | POA: Diagnosis not present

## 2022-01-14 DIAGNOSIS — F331 Major depressive disorder, recurrent, moderate: Secondary | ICD-10-CM | POA: Diagnosis not present

## 2022-01-14 DIAGNOSIS — F341 Dysthymic disorder: Secondary | ICD-10-CM | POA: Diagnosis not present

## 2022-01-16 ENCOUNTER — Encounter: Payer: BC Managed Care – PPO | Admitting: Physical Medicine & Rehabilitation

## 2022-01-16 DIAGNOSIS — F509 Eating disorder, unspecified: Secondary | ICD-10-CM | POA: Diagnosis not present

## 2022-01-16 DIAGNOSIS — F331 Major depressive disorder, recurrent, moderate: Secondary | ICD-10-CM | POA: Diagnosis not present

## 2022-01-16 DIAGNOSIS — F439 Reaction to severe stress, unspecified: Secondary | ICD-10-CM | POA: Diagnosis not present

## 2022-01-17 ENCOUNTER — Encounter
Payer: BC Managed Care – PPO | Attending: Physical Medicine & Rehabilitation | Admitting: Physical Medicine & Rehabilitation

## 2022-01-17 ENCOUNTER — Encounter: Payer: Self-pay | Admitting: Physical Medicine & Rehabilitation

## 2022-01-17 VITALS — BP 105/66 | HR 101 | Ht 62.0 in | Wt 300.0 lb

## 2022-01-17 DIAGNOSIS — M797 Fibromyalgia: Secondary | ICD-10-CM

## 2022-01-17 DIAGNOSIS — Z713 Dietary counseling and surveillance: Secondary | ICD-10-CM | POA: Diagnosis not present

## 2022-01-17 DIAGNOSIS — M2550 Pain in unspecified joint: Secondary | ICD-10-CM | POA: Diagnosis not present

## 2022-01-17 MED ORDER — PREGABALIN 100 MG PO CAPS: 100.0000 mg | ORAL_CAPSULE | Freq: Two times a day (BID) | ORAL | 0 refills | Status: DC

## 2022-01-17 NOTE — Progress Notes (Signed)
Subjective:    Patient ID: Emily [name], female    DOB: 2000-05-08, 22 y.o.   MRN: [id number]  HPI  HPI 11/25/2021  Emily [name] is a 22 y.o. year old female  who  has a past medical history of ADHD (attention deficit hyperactivity disorder), Anorexia, Anxiety, Asthma, B12 deficiency, Bulimia, Depression, Morbid obesity (HCC), and Treatment-resistant depression.   They are presenting to PM&R clinic as a new patient for pain management evaluation.  Ms. Mcdevitt has pain that is worse in her back that is stabbing aching in quality.  She has had this pain for about 8 years.  Pain interferes with her sleep.  While her back is the worst source of her pain she has pain throughout her entire body.  Gentle stretching will improve her pain.   Pain is worsened by sitting.  Pain is also increased after she stands for several minutes.  Her activity is decreased due to the pain.   She reports she would like to avoid narcotic pain medications if possible.  She reports that she has had family members who have been addicted to these types of medications.   She has been followed by rheumatology due to ESR and CRP elevations however other tests have been negative.  It does not appear that she has yet been diagnosed with any specific rheumatological condition.  She has a lot of fatigue and has insomnia with significant difficulty falling and staying asleep.  She will often have paresthesias in her arms and legs.  She has a history of depression and is on Cymbalta.  She also had TMS in September with benefit to her mood.  No SI or HI.  No bowel or bladder changes although she has chronic interstitial cystitis.     Medications tried: -Duloxetine 60mg  BID - currently using for mood -Flexeril- helped at first, not much now -Naproxone-helped at first, not much now -Tylenol- no benefit -Ibuprofen- no benefit -Oxycodone for foot surgery helped her foot pain at the time -Prednisione used in the the  past with limited benefit   Other treatments: Physical therapy did not provide significant benefit Lyrica helping Weaning off duloxetine to prestiq   Interval History  Medicine while in house is here for follow-up regarding her chronic pain.  She reports needing mild improvement in her overall pain since starting Lyrica.  She has not had any significant side effects with the medication.  Her mental health provider is planning to change her from duloxetine to a different medication.  She has not tried a TENS unit previously.   Pain Inventory Average Pain 7 Pain Right Now 4 My pain is constant, burning, dull, and aching  In the last 24 hours, has pain interfered with the following? General activity 6 Relation with others 4 Enjoyment of life 6 What TIME of day is your pain at its worst? daytime, evening, and night Sleep (in general) Poor  Pain is worse with: bending, sitting, standing, and some activites Pain improves with: rest Relief from Meds: 3  Family History  Problem Relation Age of Onset   Anxiety disorder Mother    Drug abuse Father    Hyperlipidemia Maternal Grandmother    Hypertension Maternal Grandmother    Hyperlipidemia Maternal Grandfather    Hypertension Maternal Grandfather    Colon cancer Neg Hx    Pancreatic cancer Neg Hx    Esophageal cancer Neg Hx    Social History   Socioeconomic History   Marital status: Single  Spouse name: Not on file   Number of children: Not on file   Years of education: Not on file   Highest education level: Not on file  Occupational History   Not on file  Tobacco Use   Smoking status: Never    Passive exposure: Yes   Smokeless tobacco: Never   Tobacco comments:    mother smokes outside home  Vaping Use   Vaping Use: Never used  Substance and Sexual Activity   Alcohol use: Yes    Alcohol/week: 0.0 standard drinks of alcohol    Comment: occasional   Drug use: No   Sexual activity: Never    Partners: Male     Comment: Female partners  Other Topics Concern   Not on file  Social History Narrative   Not on file   Social Determinants of Health   Financial Resource Strain: Not on file  Food Insecurity: Not on file  Transportation Needs: Not on file  Physical Activity: Not on file  Stress: Not on file  Social Connections: Not on file   Past Surgical History:  Procedure Laterality Date   ADENOIDECTOMY     BLADDER REPAIR     LIGAMENT REPAIR     TONSILLECTOMY     Past Surgical History:  Procedure Laterality Date   ADENOIDECTOMY     BLADDER REPAIR     LIGAMENT REPAIR     TONSILLECTOMY     Past Medical History:  Diagnosis Date   ADHD (attention deficit hyperactivity disorder)    Anorexia    Anxiety    Asthma    B12 deficiency    Bulimia    Depression    Morbid obesity ([place])    Treatment-resistant depression    Ht 5\' 2"  (1.575 m)   Wt 300 lb (136.1 kg)   BMI 54.87 kg/m   Opioid Risk Score:   Fall Risk Score:  `1  Depression screen [facility] 2/9     01/17/2022    3:03 PM 11/25/2021    2:51 PM 10/01/2021    2:14 PM 03/22/2021   11:20 AM 11/27/2020   11:46 AM 08/02/2020    5:00 PM 04/25/2019    3:48 PM  Depression screen PHQ 2/9  Decreased Interest 0 1 1 1 1 2 1   Down, Depressed, Hopeless 0 1 1 2 1 3 1   PHQ - 2 Score 0 2 2 3 2 5 2   Altered sleeping  2 3 3 1 2 1   Tired, decreased energy  2 3 2 1 2 1   Change in appetite  2 3 2 1 2 1   Feeling bad or failure about yourself   0 1 2 1 2 1   Trouble concentrating  1 2 0 1 0 1  Moving slowly or fidgety/restless  0 0 1 0 0 0  Suicidal thoughts  0 0      PHQ-9 Score  9 14 13 7 13 7   Difficult doing work/chores  Somewhat difficult Somewhat difficult          Review of Systems  Musculoskeletal:  Positive for back pain and neck pain.       B/L shoulder pain  B/L hip pain   All other systems reviewed and are negative.      Objective:   Physical Exam    Gen: no distress, normal appearing HEENT: oral mucosa pink and moist,  NCAT Abd: soft, non-distended Ext: no edema Psych: pleasant, normal affect Skin: intact Neuro: Alert and awake, follows  commands, sensation intact light touch in all 4 extremities, strength 5 out of 5 in all 4 extremities, cranial nerves II through XII grossly intact No ataxia noted Musculoskeletal: Slump test negative no joint swelling noted Tenderness throughout periscapular muscles Tenderness throughout paraspinal muscles Tenderness at bilateral SI joints and QL Tenderness at bilateral shoulders, elbows, left wrist Tenderness at bilateral hips, knees, ankles     10/13/21 CLINICAL DATA:  Low back pain. 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 Low back pain radiating to both legs, worsening over the last year.   EXAM: MRI LUMBAR SPINE WITHOUT AND WITH CONTRAST   TECHNIQUE: Multiplanar and multiecho pulse sequences of the lumbar spine were obtained without and with intravenous contrast.   CONTRAST:  10 cc view way.   COMPARISON:  Radiography 02/13/2021   FINDINGS: Segmentation: Transitional lumbosacral anatomy. Transitional vertebra is labeled as S1.   Alignment:  Minimal scoliotic curvature convex to the left.   Vertebrae:  No fracture or focal bone lesion.   Conus medullaris and cauda equina: Conus extends to the L1 level. Conus and cauda equina appear normal.   Paraspinal and other soft tissues: Normal   Disc levels:   Disc levels are normal. No disc degeneration, bulge or herniation. No stenosis of the canal or foramina. No advanced facet arthropathy. Very mild facet joint degeneration and enhancement at L5-S1.   IMPRESSION: 1. Transitional lumbosacral anatomy. Transitional vertebra is labeled as S1. 2. Minimal scoliotic curvature convex to the left. 3. No disc degeneration or herniation. No stenosis of the canal or foramina. 4. Very mild facet degeneration and enhancement at L5-S1. This could relate to low back pain or referred facet syndrome pain.     Assessment  & Plan:  Fibromyalgia/polyarthralgia -Currently suspect fibromyalgia is the most likely diagnosis.  She has had a extensive work-up by rheumatology and it does not appear that any abnormalities noted other than elevated ESR and CRP.  Presentation does not seem typical of polymyalgia rheumatica.  Unless alternative rheumatological condition is identified I think fibromyalgia is the most likely condition resulting in her body wide pain she also has associated depression and sleep disturbance. -Mental health provider planning to change her off Cymbalta-will monitor for changes in her pain. -Increase Lyrica to 100 mg twice daily -Continue low impact gradual progressive exercise -Discussed and provided list of foods to assist with pain control last visit -Zynex device order placed- discussed with patient   Depression -Duloxetine is being discontinued by mental health for different medication, monitor for effects of this change

## 2022-01-23 DIAGNOSIS — F509 Eating disorder, unspecified: Secondary | ICD-10-CM | POA: Diagnosis not present

## 2022-01-23 DIAGNOSIS — F331 Major depressive disorder, recurrent, moderate: Secondary | ICD-10-CM | POA: Diagnosis not present

## 2022-01-23 DIAGNOSIS — F439 Reaction to severe stress, unspecified: Secondary | ICD-10-CM | POA: Diagnosis not present

## 2022-01-29 DIAGNOSIS — F411 Generalized anxiety disorder: Secondary | ICD-10-CM | POA: Diagnosis not present

## 2022-01-29 DIAGNOSIS — F341 Dysthymic disorder: Secondary | ICD-10-CM | POA: Diagnosis not present

## 2022-01-29 DIAGNOSIS — F331 Major depressive disorder, recurrent, moderate: Secondary | ICD-10-CM | POA: Diagnosis not present

## 2022-01-30 DIAGNOSIS — F439 Reaction to severe stress, unspecified: Secondary | ICD-10-CM | POA: Diagnosis not present

## 2022-01-30 DIAGNOSIS — F509 Eating disorder, unspecified: Secondary | ICD-10-CM | POA: Diagnosis not present

## 2022-01-30 DIAGNOSIS — F331 Major depressive disorder, recurrent, moderate: Secondary | ICD-10-CM | POA: Diagnosis not present

## 2022-01-31 DIAGNOSIS — Z713 Dietary counseling and surveillance: Secondary | ICD-10-CM | POA: Diagnosis not present

## 2022-02-06 DIAGNOSIS — F331 Major depressive disorder, recurrent, moderate: Secondary | ICD-10-CM | POA: Diagnosis not present

## 2022-02-06 DIAGNOSIS — F439 Reaction to severe stress, unspecified: Secondary | ICD-10-CM | POA: Diagnosis not present

## 2022-02-06 DIAGNOSIS — F509 Eating disorder, unspecified: Secondary | ICD-10-CM | POA: Diagnosis not present

## 2022-02-13 DIAGNOSIS — F411 Generalized anxiety disorder: Secondary | ICD-10-CM | POA: Diagnosis not present

## 2022-02-13 DIAGNOSIS — F341 Dysthymic disorder: Secondary | ICD-10-CM | POA: Diagnosis not present

## 2022-02-13 DIAGNOSIS — F331 Major depressive disorder, recurrent, moderate: Secondary | ICD-10-CM | POA: Diagnosis not present

## 2022-02-27 DIAGNOSIS — F439 Reaction to severe stress, unspecified: Secondary | ICD-10-CM | POA: Diagnosis not present

## 2022-02-27 DIAGNOSIS — F331 Major depressive disorder, recurrent, moderate: Secondary | ICD-10-CM | POA: Diagnosis not present

## 2022-02-27 DIAGNOSIS — F509 Eating disorder, unspecified: Secondary | ICD-10-CM | POA: Diagnosis not present

## 2022-02-28 DIAGNOSIS — F341 Dysthymic disorder: Secondary | ICD-10-CM | POA: Diagnosis not present

## 2022-02-28 DIAGNOSIS — F411 Generalized anxiety disorder: Secondary | ICD-10-CM | POA: Diagnosis not present

## 2022-02-28 DIAGNOSIS — Z713 Dietary counseling and surveillance: Secondary | ICD-10-CM | POA: Diagnosis not present

## 2022-03-04 ENCOUNTER — Ambulatory Visit: Payer: BC Managed Care – PPO | Admitting: Obstetrics and Gynecology

## 2022-03-06 DIAGNOSIS — F509 Eating disorder, unspecified: Secondary | ICD-10-CM | POA: Diagnosis not present

## 2022-03-06 DIAGNOSIS — F331 Major depressive disorder, recurrent, moderate: Secondary | ICD-10-CM | POA: Diagnosis not present

## 2022-03-06 DIAGNOSIS — F439 Reaction to severe stress, unspecified: Secondary | ICD-10-CM | POA: Diagnosis not present

## 2022-03-13 DIAGNOSIS — F509 Eating disorder, unspecified: Secondary | ICD-10-CM | POA: Diagnosis not present

## 2022-03-13 DIAGNOSIS — F331 Major depressive disorder, recurrent, moderate: Secondary | ICD-10-CM | POA: Diagnosis not present

## 2022-03-13 DIAGNOSIS — F439 Reaction to severe stress, unspecified: Secondary | ICD-10-CM | POA: Diagnosis not present

## 2022-03-18 DIAGNOSIS — R229 Localized swelling, mass and lump, unspecified: Secondary | ICD-10-CM | POA: Diagnosis not present

## 2022-03-20 ENCOUNTER — Encounter: Payer: Self-pay | Admitting: Physical Medicine & Rehabilitation

## 2022-03-20 ENCOUNTER — Encounter
Payer: BC Managed Care – PPO | Attending: Physical Medicine & Rehabilitation | Admitting: Physical Medicine & Rehabilitation

## 2022-03-20 VITALS — BP 109/77 | HR 88 | Ht 62.0 in | Wt 301.0 lb

## 2022-03-20 DIAGNOSIS — F329 Major depressive disorder, single episode, unspecified: Secondary | ICD-10-CM | POA: Diagnosis not present

## 2022-03-20 DIAGNOSIS — M797 Fibromyalgia: Secondary | ICD-10-CM | POA: Insufficient documentation

## 2022-03-20 DIAGNOSIS — M2550 Pain in unspecified joint: Secondary | ICD-10-CM | POA: Diagnosis not present

## 2022-03-20 MED ORDER — PREGABALIN 150 MG PO CAPS: 150.0000 mg | ORAL_CAPSULE | Freq: Two times a day (BID) | ORAL | 4 refills | Status: DC

## 2022-03-20 NOTE — Progress Notes (Signed)
Subjective:    Patient ID: Emily [name], female    DOB: 2000/09/22, 22 y.o.   MRN: [id number]  HPI HPI 11/25/2021  Emily [name] is a 22 y.o. year old female  who  has a past medical history of ADHD (attention deficit hyperactivity disorder), Anorexia, Anxiety, Asthma, B12 deficiency, Bulimia, Depression, Morbid obesity ([place]), and Treatment-resistant depression.   They are presenting to PM&R clinic as a new patient for pain management evaluation.  Emily [name] has pain that is worse in her back that is stabbing aching in quality.  She has had this pain for about 8 years.  Pain interferes with her sleep.  While her back is the worst source of her pain she has pain throughout her entire body.  Gentle stretching will improve her pain.   Pain is worsened by sitting.  Pain is also increased after she stands for several minutes.  Her activity is decreased due to the pain.   She reports she would like to avoid narcotic pain medications if possible.  She reports that she has had family members who have been addicted to these types of medications.   She has been followed by rheumatology due to ESR and CRP elevations however other tests have been negative.  It does not appear that she has yet been diagnosed with any specific rheumatological condition.  She has a lot of fatigue and has insomnia with significant difficulty falling and staying asleep.  She will often have paresthesias in her arms and legs.  She has a history of depression and is on Cymbalta.  She also had [place] in September with benefit to her mood.  No SI or HI.  No bowel or bladder changes although she has chronic interstitial cystitis.     Medications tried: -Duloxetine '60mg'$  BID - currently using for mood -Flexeril- helped at first, not much now -Naproxone-helped at first, not much now -Tylenol- no benefit -Ibuprofen- no benefit -Oxycodone for foot surgery helped her foot pain at the time -Prednisione used in the the past  with limited benefit   Other treatments: Physical therapy did not provide significant benefit Lyrica helping Weaning off duloxetine to prestiq     Interval History 01/17/22 Medicine while in house is here for follow-up regarding her chronic pain.  She reports needing mild improvement in her overall pain since starting Lyrica.  She has not had any significant side effects with the medication.  Her mental health provider is planning to change her from duloxetine to a different medication.  She has not tried a TENS unit previously.  Interval History 03/19/21 Emily [name] is here for her chronic pain follow-up.  She reports the pain in her hips and lower back is doing better with Lyrica.  She has less soreness in her arms and legs.  She continues to have occasional shooting pain down the legs.  She denies any side effects with Lyrica.  She continues to have pain around her neck and traps.  Aleve does provide benefit to her pain.  She has been attempting to work on home exercise with walking, she thinks this is beneficial.  Pain Inventory Average Pain 7 Pain Right Now 5 My pain is sharp, burning, dull, and aching  In the last 24 hours, has pain interfered with the following? General activity 7 Relation with others 2 Enjoyment of life 6 What TIME of day is your pain at its worst? evening and night Sleep (in general) Poor  Pain is worse with: sitting,  inactivity, and standing Pain improves with: medication Relief from Meds: 6  Family History  Problem Relation Age of Onset   Anxiety disorder Mother    Drug abuse Father    Hyperlipidemia Maternal Grandmother    Hypertension Maternal Grandmother    Hyperlipidemia Maternal Grandfather    Hypertension Maternal Grandfather    Colon cancer Neg Hx    Pancreatic cancer Neg Hx    Esophageal cancer Neg Hx    Social History   Socioeconomic History   Marital status: Single    Spouse name: Not on file   Number of children: Not on file    Years of education: Not on file   Highest education level: Not on file  Occupational History   Not on file  Tobacco Use   Smoking status: Never    Passive exposure: Yes   Smokeless tobacco: Never   Tobacco comments:    mother smokes outside home  Vaping Use   Vaping Use: Never used  Substance and Sexual Activity   Alcohol use: Yes    Alcohol/week: 0.0 standard drinks of alcohol    Comment: occasional   Drug use: No   Sexual activity: Never    Partners: Male    Comment: Female partners  Other Topics Concern   Not on file  Social History Narrative   Not on file   Social Determinants of Health   Financial Resource Strain: Not on file  Food Insecurity: Not on file  Transportation Needs: Not on file  Physical Activity: Not on file  Stress: Not on file  Social Connections: Not on file   Past Surgical History:  Procedure Laterality Date   ADENOIDECTOMY     BLADDER REPAIR     LIGAMENT REPAIR     TONSILLECTOMY     Past Surgical History:  Procedure Laterality Date   ADENOIDECTOMY     BLADDER REPAIR     LIGAMENT REPAIR     TONSILLECTOMY     Past Medical History:  Diagnosis Date   ADHD (attention deficit hyperactivity disorder)    Anorexia    Anxiety    Asthma    B12 deficiency    Bulimia    Depression    Morbid obesity ([place])    Treatment-resistant depression    BP 109/77   Pulse 88   Ht '5\' 2"'$  (1.575 m)   Wt (!) 301 lb (136.5 kg)   SpO2 97%   BMI 55.05 kg/m   Opioid Risk Score:   Fall Risk Score:  `1  Depression screen [facility] 2/9     01/17/2022    3:03 PM 11/25/2021    2:51 PM 10/01/2021    2:14 PM 03/22/2021   11:20 AM 11/27/2020   11:46 AM 08/02/2020    5:00 PM 04/25/2019    3:48 PM  Depression screen PHQ 2/9  Decreased Interest 0 '1 1 1 1 2 1  '$ Down, Depressed, Hopeless 0 '1 1 2 1 3 1  '$ PHQ - 2 Score 0 '2 2 3 2 5 2  '$ Altered sleeping  '2 3 3 1 2 1  '$ Tired, decreased energy  '2 3 2 1 2 1  '$ Change in appetite  '2 3 2 1 2 1  '$ Feeling bad or failure about yourself    0 '1 2 1 2 1  '$ Trouble concentrating  1 2 0 1 0 1  Moving slowly or fidgety/restless  0 0 1 0 0 0  Suicidal thoughts  0 0  PHQ-9 Score  '9 14 13 7 13 7  '$ Difficult doing work/chores  Somewhat difficult Somewhat difficult        Review of Systems  Musculoskeletal:  Positive for back pain and neck pain.       Shoulder, hips, b/l legs  All other systems reviewed and are negative.      Objective:   Physical Exam  Gen: no distress, normal appearing HEENT: oral mucosa pink and moist, NCAT Abd: soft, non-distended Ext: no edema Psych: pleasant, normal affect Skin: intact Neuro: Alert and awake, follows commands, sensation intact light touch in all 4 extremities, strength 5 out of 5 in all 4 extremities, cranial nerves II through XII grossly intact No ataxia noted Musculoskeletal: Slump test negative no joint swelling noted Tenderness throughout periscapular muscles Trigger points b/l trapezius Tenderness throughout paraspinal muscles Tenderness at bilateral SI joints and QL- improved Tenderness at bilateral shoulders, hips Minimal Tenderness elbows, wrists, knees, ankles-improved from last visit     10/13/21 CLINICAL DATA:  Low back pain. 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 Low back pain radiating to both legs, worsening over the last year.   EXAM: MRI LUMBAR SPINE WITHOUT AND WITH CONTRAST   TECHNIQUE: Multiplanar and multiecho pulse sequences of the lumbar spine were obtained without and with intravenous contrast.   CONTRAST:  10 cc view way.   COMPARISON:  Radiography 02/13/2021   FINDINGS: Segmentation: Transitional lumbosacral anatomy. Transitional vertebra is labeled as S1.   Alignment:  Minimal scoliotic curvature convex to the left.   Vertebrae:  No fracture or focal bone lesion.   Conus medullaris and cauda equina: Conus extends to the L1 level. Conus and cauda equina appear normal.   Paraspinal and other soft tissues: Normal   Disc levels:   Disc levels  are normal. No disc degeneration, bulge or herniation. No stenosis of the canal or foramina. No advanced facet arthropathy. Very mild facet joint degeneration and enhancement at L5-S1.   IMPRESSION: 1. Transitional lumbosacral anatomy. Transitional vertebra is labeled as S1. 2. Minimal scoliotic curvature convex to the left. 3. No disc degeneration or herniation. No stenosis of the canal or foramina. 4. Very mild facet degeneration and enhancement at L5-S1. This could relate to low back pain or referred facet syndrome pain.       Assessment & Plan:  Fibromyalgia/polyarthralgia -Currently suspect fibromyalgia is the most likely diagnosis.  She has had a extensive work-up by rheumatology and it does not appear that any abnormalities noted other than elevated ESR and CRP.  Presentation does not seem typical of polymyalgia rheumatica.  Unless alternative rheumatological condition is identified I think fibromyalgia is the most likely condition resulting in her body wide pain she also has associated depression and sleep disturbance. -On exam she appears to be less tender in her arms and legs. -Mental health provider changing Cymbalta to different medication -Increase Lyrica to '150mg'$  BID -Continue low impact gradual progressive exercise -Discussed and provided list of foods to assist with pain control prior visit -Discussed TENS unit, she may purchase device herself -Discussed trying trigger point injections next visit- trapezius muscles, patient would like to try this -Discussed theracane or similar device   Depression -continue f/u with mental health provider

## 2022-03-27 DIAGNOSIS — F439 Reaction to severe stress, unspecified: Secondary | ICD-10-CM | POA: Diagnosis not present

## 2022-03-27 DIAGNOSIS — F331 Major depressive disorder, recurrent, moderate: Secondary | ICD-10-CM | POA: Diagnosis not present

## 2022-03-27 DIAGNOSIS — F509 Eating disorder, unspecified: Secondary | ICD-10-CM | POA: Diagnosis not present

## 2022-03-30 NOTE — Patient Instructions (Incomplete)
1. Moderate persistent asthma - Daily controller medication(s): Breztri two puffs twice daily with spacer to help prevent cough and wheeze. Rinse mouth - Prior to physical activity: albuterol 2 puffs 10-15 minutes before physical activity. - Rescue medications: albuterol 4 puffs every 4-6 hours as needed - Asthma control goals:  * Full participation in all desired activities (may need albuterol before activity) * Albuterol use two time or less a week on average (not counting use with activity) * Cough interfering with sleep two time or less a month * Oral steroids no more than once a year * No hospitalizations  2. Perennial allergic rhinitis - lab testing on  11/07/21 positive to dust mite - Continue  Xhance one spray per nostril twice daily for stuffy nose AND continue Xyzal 5mg  daily as needed for runny nose - You can take an extra dose of antihistamines if needed on bad days.   3. Schedule a follow up appointment in  months or sooner if needed

## 2022-03-31 ENCOUNTER — Ambulatory Visit (INDEPENDENT_AMBULATORY_CARE_PROVIDER_SITE_OTHER): Payer: BC Managed Care – PPO | Admitting: Family

## 2022-03-31 ENCOUNTER — Encounter: Payer: Self-pay | Admitting: Family

## 2022-03-31 DIAGNOSIS — J4541 Moderate persistent asthma with (acute) exacerbation: Secondary | ICD-10-CM | POA: Diagnosis not present

## 2022-03-31 DIAGNOSIS — J3089 Other allergic rhinitis: Secondary | ICD-10-CM

## 2022-03-31 MED ORDER — PREDNISONE 10 MG PO TABS: ORAL_TABLET | ORAL | 0 refills | Status: DC

## 2022-03-31 NOTE — Progress Notes (Signed)
RE: Emily [name] MRN: [id number] DOB: 2000-05-13 Date of Telemedicine Visit: 03/31/2022  Referring provider: Marrian Salvage,* Primary care provider: Marrian Salvage, FNP  Chief Complaint: Follow-up (Pt c/o she is having issues with her asthma flaring x 2 months. Feeling very restricted when breathing.) and Wheezing   Telemedicine Follow Up Visit via Telephone: I connected with Emily [name] for a follow up on 03/31/22 by telephone and verified that I am speaking with the correct person using two identifiers.   I discussed the limitations, risks, security and privacy concerns of performing an evaluation and management service by telephone and the availability of in person appointments. I also discussed with the patient that there may be a patient responsible charge related to this service. The patient expressed understanding and agreed to proceed.  Patient is at home .  Provider is at the office.  Visit start time: 2:26 PM Visit end time: 2:49 PM Insurance consent/check in by: Elta [place] Medical consent and medical assistant/nurse: Laurell Roof  History of Present Illness: She is a 22 y.o. female, who is being followed for well-controlled moderate persistent asthma and perennial allergic rhinitis. Her previous allergy office visit was on December 02, 2021 with  Emily Charon, FNP .   Moderate persistent asthma: She describes her asthma as struggling right now.  She is in a bad flareup due to air quality.  She reports really bad shortness of breath, wheezing, and a lot of tightness in her chest.  She does report that albuterol does help with the symptoms.  She is using albuterol 2-4 times a week depending on what she is doing.  She denies fever, chills, coughing, and nocturnal awakenings due to breathing problems.  She reports earlier this month when she was at the doctor's office her oxygen was low at 87%.  They had her take a couple deep breaths and she got up to  93%.  She does continue to take Breztri 2 puffs twice a day with a spacer.  Since her last office visit she has not made any trips to the emergency room or urgent care due to breathing problems.  She has also not received any steroids since her last office visit.  She thinks that she has maybe had 1-2 steroids in the past year.  One of the steroid she feels like was for her back.  She reports that she is able to tolerate steroids and that it may cause her to be a little bit more angry or moody.  Her absolute eosinophil count on November 07, 2021 was 0.1.  Her lab work to environmental allergens was positive to dust mite 0.28 kU/L and her IgE was 17.  Perennial allergic rhinitis: She reports a little bit of nasal congestion which she feels like is normal for her.  She also has some postnasal drip when she eats certain foods.  She denies rhinorrhea.  She has not had any sinus infections since we last saw her.  She does feel like Nasacort has been working good.  She continues to use Nasacort at night.  She also takes Xyzal 5 mg daily.    Perennial allergic rhinitis  Assessment and Plan: [name] is a 22 y.o. female with: Patient Instructions  1. Moderate persistent asthma with acute exacerbation -Start prednisone 10 mg taking 2 tablets twice a day for 3 days, then on the 4th day take 2 tablets in the morning and on the 5th day take one tablet and stop.  - If  your symptoms do not get better please go to urgent care or the emergency room -Consider Tezspire injections.  Will give you information at your next office visit next week - Daily controller medication(s): Breztri two puffs twice daily with spacer to help prevent cough and wheeze. Rinse mouth - Prior to physical activity: albuterol 2 puffs 10-15 minutes before physical activity. - Rescue medications: albuterol 4 puffs every 4-6 hours as needed - Asthma control goals:  * Full participation in all desired activities (may need albuterol before  activity) * Albuterol use two time or less a week on average (not counting use with activity) * Cough interfering with sleep two time or less a month * Oral steroids no more than once a year * No hospitalizations  2. Perennial allergic rhinitis - lab testing on  11/07/21 positive to dust mite - Continue  Nasacort one spray per nostril twice daily for stuffy nose AND continue Xyzal 5mg  daily as needed for runny nose - You can take an extra dose of antihistamines if needed on bad days.   - Please contact your providers about the medications causing palpitations -Will refer to neurology do to the daily headaches  3.  Keep your already scheduled follow-up appointment on April 08, 2022 at 4 PM or sooner if needed     Return in about 8 days (around 04/08/2022), or if symptoms worsen or fail to improve.  Meds ordered this encounter  Medications   predniSONE (DELTASONE) 10 MG tablet    Sig: Take 2 tablets twice a day for 3 days, then on the 4th day take 2 tablets in the morning, and on the 5th day take one tablet and stop    Dispense:  15 tablet    Refill:  0   Lab Orders  No laboratory test(s) ordered today    Diagnostics: None.  Medication List:  Current Outpatient Medications  Medication Sig Dispense Refill   albuterol (VENTOLIN HFA) 108 (90 Base) MCG/ACT inhaler INHALE 1-2 PUFFS BY MOUTH EVERY 6 HOURS AS NEEDED FOR WHEEZE OR SHORTNESS OF BREATH 18 each 1   atomoxetine (STRATTERA) 60 MG capsule Take 60 mg by mouth daily.     BREZTRI AEROSPHERE 160-9-4.8 MCG/ACT AERO Inhale 2 puffs into the lungs in the morning and at bedtime. 10.7 g 5   cyclobenzaprine (FLEXERIL) 10 MG tablet Take 1 tablet (10 mg total) by mouth 3 (three) times daily as needed for muscle spasms. 30 tablet 0   DULoxetine (CYMBALTA) 60 MG capsule TAKE 1 CAPSULE BY MOUTH 2 TIMES DAILY. 180 capsule 1   levonorgestrel-ethinyl estradiol (ALTAVERA) 0.15-30 MG-MCG tablet Take 1 tablet by mouth daily. 112 tablet 1    lisinopril (ZESTRIL) 10 MG tablet Take 1 tablet (10 mg total) by mouth daily. 90 tablet 1   naproxen (NAPROSYN) 500 MG tablet TAKE 1 TABLET BY MOUTH TWICE A DAY AS NEEDED 60 tablet 1   omeprazole (PRILOSEC) 20 MG capsule Take 1 capsule (20 mg total) by mouth daily. 90 capsule 1   predniSONE (DELTASONE) 10 MG tablet Take 2 tablets twice a day for 3 days, then on the 4th day take 2 tablets in the morning, and on the 5th day take one tablet and stop 15 tablet 0   pregabalin (LYRICA) 150 MG capsule Take 1 capsule (150 mg total) by mouth 2 (two) times daily. 30 capsule 4   QUEtiapine (SEROQUEL) 25 MG tablet Take 25 mg by mouth at bedtime.     QUEtiapine (SEROQUEL) 50  MG tablet Take 50 mg by mouth at bedtime.     XHANCE 93 MCG/ACT EXHU Place 1 spray into the nose in the morning and at bedtime. (Patient not taking: Reported on 03/31/2022) 16 mL 5   No current facility-administered medications for this visit.   Allergies: Allergies  Allergen Reactions   Dust Mite Extract Other (See Comments)    Sneezing, coughing, runny nose Sneezing, coughing, runny nose    I reviewed her past medical history, social history, family history, and environmental history and no significant changes have been reported from previous visit on December 02, 2021.  Review of Systems  Constitutional:  Negative for chills and fever.  HENT:         Reports a little bit of nasal congestion.  She also has postnasal drip with certain foods.  Denies rhinorrhea  Eyes:        Denies itchy watery eyes  Respiratory:         Reports shortness of breath that is really bad, wheezing, and a lot of tightness in her chest.  Denies coughing and nocturnal awakenings due to breathing problems.  Cardiovascular:  Positive for palpitations. Negative for chest pain.       Reports palpitations since taking 2 medications together that clash.  She reports that the pharmacist told her this could happen.  She has not spoken with the physicians that  have prescribed this medicine.  Instructed her to contact them.  She verbalizes understanding.  Gastrointestinal:        Reports cramping right now.  She also reports reflux symptoms that can be worse when she is purging. Her symptoms get better when she does not purge. She does take omeprazole at night  Skin:  Negative for rash.       Denies itchy skin  Neurological:  Positive for headaches.       Reports daily headaches that she describes as "brain fog headaches.".  She feels that she has a brick in her head.  The headaches are worse at night she.  She describes the pain as a dull ache and she does not feel like they are migraines, because she can still function.   Objective: Physical Exam Not obtained as encounter was done via telephone.   Previous notes and tests were reviewed.  I discussed the assessment and treatment plan with the patient. The patient was provided an opportunity to ask questions and all were answered. The patient agreed with the plan and demonstrated an understanding of the instructions.   The patient was advised to call back or seek an in-person evaluation if the symptoms worsen or if the condition fails to improve as anticipated.  I provided 23 minutes of non-face-to-face time during this encounter.  It was my pleasure to participate in [place] care today. Please feel free to contact me with any questions or concerns.   Sincerely,  Emily Charon, FNP

## 2022-04-03 DIAGNOSIS — F4010 Social phobia, unspecified: Secondary | ICD-10-CM | POA: Diagnosis not present

## 2022-04-03 DIAGNOSIS — F439 Reaction to severe stress, unspecified: Secondary | ICD-10-CM | POA: Diagnosis not present

## 2022-04-03 DIAGNOSIS — F509 Eating disorder, unspecified: Secondary | ICD-10-CM | POA: Diagnosis not present

## 2022-04-03 DIAGNOSIS — F341 Dysthymic disorder: Secondary | ICD-10-CM | POA: Diagnosis not present

## 2022-04-03 DIAGNOSIS — F411 Generalized anxiety disorder: Secondary | ICD-10-CM | POA: Diagnosis not present

## 2022-04-03 DIAGNOSIS — F331 Major depressive disorder, recurrent, moderate: Secondary | ICD-10-CM | POA: Diagnosis not present

## 2022-04-06 ENCOUNTER — Other Ambulatory Visit: Payer: Self-pay | Admitting: Family

## 2022-04-06 DIAGNOSIS — N946 Dysmenorrhea, unspecified: Secondary | ICD-10-CM

## 2022-04-07 NOTE — Patient Instructions (Incomplete)
1. Moderate persistent asthma  -Consider Tezspire injections.  Information given on Tezspire. Call us if this is something that you are interested in starting - Daily controller medication(s): Breztri two puffs twice daily with spacer to help prevent cough and wheeze. Rinse mouth - Prior to physical activity: albuterol 2 puffs 10-15 minutes before physical activity. - Rescue medications: albuterol 4 puffs every 4-6 hours as needed - Asthma control goals:  * Full participation in all desired activities (may need albuterol before activity) * Albuterol use two time or less a week on average (not counting use with activity) * Cough interfering with sleep two time or less a month * Oral steroids no more than once a year * No hospitalizations  2. Perennial allergic rhinitis - lab testing on  11/07/21 positive to dust mite - Continue  Nasacort one spray per nostril twice daily for stuffy nose AND continue Xyzal 5mg  daily as needed for runny nose - You can take an extra dose of antihistamines if needed on bad days.   3. Gustatory rhinitis Start ipratropium bromide 0.03% using 1-2 sprays  as needed in each nostril 10-15 minutes before breakfast, lunch and dinner. Caution as this can be drying  4.  Schedule a follow up appointment in 4 weeks or sooner if needed

## 2022-04-08 ENCOUNTER — Ambulatory Visit (INDEPENDENT_AMBULATORY_CARE_PROVIDER_SITE_OTHER): Payer: BC Managed Care – PPO | Admitting: Family

## 2022-04-08 ENCOUNTER — Other Ambulatory Visit: Payer: Self-pay

## 2022-04-08 ENCOUNTER — Encounter: Payer: Self-pay | Admitting: Family

## 2022-04-08 VITALS — BP 110/60 | HR 99 | Temp 98.3°F | Resp 12 | Wt 300.6 lb

## 2022-04-08 DIAGNOSIS — J4540 Moderate persistent asthma, uncomplicated: Secondary | ICD-10-CM | POA: Diagnosis not present

## 2022-04-08 DIAGNOSIS — J3089 Other allergic rhinitis: Secondary | ICD-10-CM

## 2022-04-08 DIAGNOSIS — J310 Chronic rhinitis: Secondary | ICD-10-CM

## 2022-04-08 NOTE — Progress Notes (Signed)
[place] [id number] Dept: [phone]  FOLLOW UP NOTE  Patient ID: Emily [name], female    DOB: 2000/06/16  Age: 22 y.o. MRN: [id number] Date of Office Visit: 04/08/2022  Assessment  Chief Complaint: Follow-up  HPI Emily [name] is a 22 year old female who presents today for follow-up of moderate persistent asthma with acute exacerbation and perennial allergic rhinitis.  She was last seen by myself on March 31, 2022 via televisit.  She denies any new diagnosis or surgery since her last office visit.  Moderate persistent asthma: She continues to take Breztri 2 puffs twice a day with a spacer and has albuterol to use as needed.  She did finish the prednisone that was prescribed at her last office visit.  She did feel like the prednisone opened her up.  She can tell though that today the air quality is not as good.  She feels like her asthma is limiting her physical activity.  She reports very minimal cough that can be worse with cold air, pressure in her chest/tightness in chest, and shortness of breath.  She denies nocturnal awakenings due to breathing problems and wheezing.  She feels like it takes an effort to get a deep breath.  She also mentions that she knows that anxiety can be a part of this also.  She has used her albuterol inhaler once since we last saw her.  When she uses her albuterol it does help with the sensation of pressure in her chest/tightness in chest, and shortness of breath.  She has not made any trips to the emergency room or urgent care due to breathing problems since we last saw her.  Perennial allergic rhinitis: She is currently taking Nasacort 2 sprays each nostril at night and Xyzal 5 mg a day.  She reports nasal congestion and a drippy nose after eating main meals.  She does not have a drippy nose with snacks.  She has not had any sinus infections since we last saw her.   Drug Allergies:  Allergies  Allergen Reactions   Dust Mite  Extract Other (See Comments)    Sneezing, coughing, runny nose Sneezing, coughing, runny nose     Review of Systems: Review of Systems  Constitutional:  Negative for chills and fever.  HENT:         Reports nasal congestion.  She also reports a drippy nose before eating major meals.  It does not occur with snacks.  She denies postnasal drip.  Eyes:        Reports that she has dry eyes  Respiratory:  Positive for cough and shortness of breath. Negative for wheezing.        Reports feeling pressure in her chest, shortness of breath, and minimal coughing.that are all relieved with albuterol.  Denies nocturnal awakenings due to breathing problems and wheezing.  Cardiovascular:  Negative for chest pain and palpitations.  Gastrointestinal:        Denies heartburn or reflux symptoms  Skin:  Negative for itching and rash.  Neurological:  Positive for headaches.       Reports headaches that she feels are migraines.  Is awaiting referral to neurologist.  Endo/Heme/Allergies:  Positive for environmental allergies.     Physical Exam: BP 110/60   Pulse 99   Temp 98.3 F (36.8 C) (Temporal)   Resp 12   Wt (!) 300 lb 9.6 oz (136.4 kg)   SpO2 96%   BMI 54.98 kg/m  Physical Exam Constitutional:      Appearance: Normal appearance.  HENT:     Head: Normocephalic and atraumatic.     Comments: Pharynx normal, eyes normal, ears normal, nose: Bilateral lower turbinates mildly edematous with no drainage noted    Right Ear: Tympanic membrane, ear canal and external ear normal.     Left Ear: Tympanic membrane, ear canal and external ear normal.     Mouth/Throat:     Mouth: Mucous membranes are moist.     Pharynx: Oropharynx is clear.  Eyes:     Conjunctiva/sclera: Conjunctivae normal.  Cardiovascular:     Rate and Rhythm: Regular rhythm. Tachycardia present.     Heart sounds: Normal heart sounds.  Pulmonary:     Effort: Pulmonary effort is normal.     Breath sounds: Normal breath  sounds.     Comments: Lungs clear to auscultation Musculoskeletal:     Cervical back: Neck supple.  Skin:    General: Skin is warm.  Neurological:     Mental Status: She is alert and oriented to person, place, and time.  Psychiatric:        Mood and Affect: Mood normal.        Behavior: Behavior normal.        Thought Content: Thought content normal.        Judgment: Judgment normal.     Diagnostics: FVC 3.78 L (106%), FEV1 3.30 L (106%).  Spirometry indicates normal spirometry.  Assessment and Plan: 1. Not well controlled moderate persistent asthma   2. Perennial allergic rhinitis   3. Gustatory rhinitis     No orders of the defined types were placed in this encounter.   Patient Instructions  1. Moderate persistent asthma  -Consider Tezspire injections.  Information given on Tezspire. Call us if this is something that you are interested in starting - Daily controller medication(s): Breztri two puffs twice daily with spacer to help prevent cough and wheeze. Rinse mouth - Prior to physical activity: albuterol 2 puffs 10-15 minutes before physical activity. - Rescue medications: albuterol 4 puffs every 4-6 hours as needed - Asthma control goals:  * Full participation in all desired activities (may need albuterol before activity) * Albuterol use two time or less a week on average (not counting use with activity) * Cough interfering with sleep two time or less a month * Oral steroids no more than once a year * No hospitalizations  2. Perennial allergic rhinitis - lab testing on  11/07/21 positive to dust mite - Continue  Nasacort one spray per nostril twice daily for stuffy nose AND continue Xyzal 5mg  daily as needed for runny nose - You can take an extra dose of antihistamines if needed on bad days.   3. Gustatory rhinitis Start ipratropium bromide 0.03% using 1-2 sprays  as needed in each nostril 10-15 minutes before breakfast, lunch and dinner. Caution as this can be  drying  4.  Schedule a follow up appointment in 4 weeks or sooner if needed    Return in about 4 weeks (around 05/06/2022).    Thank you for the opportunity to care for this patient.  Please do not hesitate to contact me with questions.  Althea Charon, FNP [facility] [place] [facility] [place]

## 2022-04-10 DIAGNOSIS — F331 Major depressive disorder, recurrent, moderate: Secondary | ICD-10-CM | POA: Diagnosis not present

## 2022-04-10 DIAGNOSIS — F439 Reaction to severe stress, unspecified: Secondary | ICD-10-CM | POA: Diagnosis not present

## 2022-04-10 DIAGNOSIS — F509 Eating disorder, unspecified: Secondary | ICD-10-CM | POA: Diagnosis not present

## 2022-04-14 ENCOUNTER — Other Ambulatory Visit: Payer: Self-pay | Admitting: Family

## 2022-04-14 DIAGNOSIS — K219 Gastro-esophageal reflux disease without esophagitis: Secondary | ICD-10-CM

## 2022-04-15 DIAGNOSIS — Z808 Family history of malignant neoplasm of other organs or systems: Secondary | ICD-10-CM | POA: Diagnosis not present

## 2022-04-15 DIAGNOSIS — L578 Other skin changes due to chronic exposure to nonionizing radiation: Secondary | ICD-10-CM | POA: Diagnosis not present

## 2022-04-15 DIAGNOSIS — D2261 Melanocytic nevi of right upper limb, including shoulder: Secondary | ICD-10-CM | POA: Diagnosis not present

## 2022-04-15 DIAGNOSIS — L814 Other melanin hyperpigmentation: Secondary | ICD-10-CM | POA: Diagnosis not present

## 2022-04-17 ENCOUNTER — Encounter: Payer: Self-pay | Admitting: Physical Medicine & Rehabilitation

## 2022-04-17 ENCOUNTER — Encounter
Payer: BC Managed Care – PPO | Attending: Physical Medicine & Rehabilitation | Admitting: Physical Medicine & Rehabilitation

## 2022-04-17 VITALS — BP 110/65 | HR 111 | Ht 62.0 in | Wt 303.8 lb

## 2022-04-17 DIAGNOSIS — M797 Fibromyalgia: Secondary | ICD-10-CM

## 2022-04-17 DIAGNOSIS — M2550 Pain in unspecified joint: Secondary | ICD-10-CM

## 2022-04-17 DIAGNOSIS — F329 Major depressive disorder, single episode, unspecified: Secondary | ICD-10-CM | POA: Diagnosis not present

## 2022-04-17 MED ORDER — LIDOCAINE HCL 1 % IJ SOLN
4.0000 mL | Freq: Once | INTRAMUSCULAR | Status: AC
  Administered 2022-04-17: 4 mL

## 2022-04-17 NOTE — Progress Notes (Signed)
Subjective:    Patient ID: Emily [name], female    DOB: 05-18-2000, 22 y.o.   MRN: [id number]  HPI HPI 11/25/2021  Emily [name] is a 22 y.o. year old female  who  has a past medical history of ADHD (attention deficit hyperactivity disorder), Anorexia, Anxiety, Asthma, B12 deficiency, Bulimia, Depression, Morbid obesity (HCC), and Treatment-resistant depression.   They are presenting to PM&R clinic as a new patient for pain management evaluation.  Emily [name] has pain that is worse in her back that is stabbing aching in quality.  She has had this pain for about 8 years.  Pain interferes with her sleep.  While her back is the worst source of her pain she has pain throughout her entire body.  Gentle stretching will improve her pain.   Pain is worsened by sitting.  Pain is also increased after she stands for several minutes.  Her activity is decreased due to the pain.   She reports she would like to avoid narcotic pain medications if possible.  She reports that she has had family members who have been addicted to these types of medications.   She has been followed by rheumatology due to ESR and CRP elevations however other tests have been negative.  It does not appear that she has yet been diagnosed with any specific rheumatological condition.  She has a lot of fatigue and has insomnia with significant difficulty falling and staying asleep.  She will often have paresthesias in her arms and legs.  She has a history of depression and is on Cymbalta.  She also had TMS in September with benefit to her mood.  No SI or HI.  No bowel or bladder changes although she has chronic interstitial cystitis.     Medications tried: -Duloxetine 60mg  BID - currently using for mood -Flexeril- helped at first, not much now -Naproxone-helped at first, not much now -Tylenol- no benefit -Ibuprofen- no benefit -Oxycodone for foot surgery helped her foot pain at the time -Prednisione used in the the past  with limited benefit   Other treatments: Physical therapy did not provide significant benefit Lyrica helping Weaning off duloxetine to prestiq     Interval History 01/17/22 Medicine while in house is here for follow-up regarding her chronic pain.  She reports needing mild improvement in her overall pain since starting Lyrica.  She has not had any significant side effects with the medication.  Her mental health provider is planning to change her from duloxetine to a different medication.  She has not tried a TENS unit previously.  Interval History 03/19/21 Emily [name] is here for her chronic pain follow-up.  She reports the pain in her hips and lower back is doing better with Lyrica.  She has less soreness in her arms and legs.  She continues to have occasional shooting pain down the legs.  She denies any side effects with Lyrica.  She continues to have pain around her neck and traps.  Aleve does provide benefit to her pain.  She has been attempting to work on home exercise with walking, she thinks this is beneficial.  Interval History 03/19/21 Patient is here for trigger point injection to her trapezius muscles where she was noted to have several trigger points.  Patient reports Lyrica continues to improve her pain and not causing any significant side effects.  She has tried Thera cane however she finds it is too painful.  We discussed    Pain Inventory Average Pain 7  Pain Right Now 5 My pain is sharp, burning, dull, and aching  In the last 24 hours, has pain interfered with the following? General activity 7 Relation with others 2 Enjoyment of life 6 What TIME of day is your pain at its worst? evening and night Sleep (in general) Poor  Pain is worse with: sitting, inactivity, and standing Pain improves with: medication Relief from Meds: 6  Family History  Problem Relation Age of Onset   Anxiety disorder Mother    Drug abuse Father    Hyperlipidemia Maternal Grandmother     Hypertension Maternal Grandmother    Hyperlipidemia Maternal Grandfather    Hypertension Maternal Grandfather    Colon cancer Neg Hx    Pancreatic cancer Neg Hx    Esophageal cancer Neg Hx    Social History   Socioeconomic History   Marital status: Single    Spouse name: Not on file   Number of children: Not on file   Years of education: Not on file   Highest education level: Not on file  Occupational History   Not on file  Tobacco Use   Smoking status: Never    Passive exposure: Yes   Smokeless tobacco: Never   Tobacco comments:    mother smokes outside home  Vaping Use   Vaping Use: Never used  Substance and Sexual Activity   Alcohol use: Yes    Alcohol/week: 0.0 standard drinks of alcohol    Comment: occasional   Drug use: No   Sexual activity: Never    Partners: Male    Comment: Female partners  Other Topics Concern   Not on file  Social History Narrative   Not on file   Social Determinants of Health   Financial Resource Strain: Not on file  Food Insecurity: Not on file  Transportation Needs: Not on file  Physical Activity: Not on file  Stress: Not on file  Social Connections: Not on file   Past Surgical History:  Procedure Laterality Date   ADENOIDECTOMY     BLADDER REPAIR     LIGAMENT REPAIR     TONSILLECTOMY     Past Surgical History:  Procedure Laterality Date   ADENOIDECTOMY     BLADDER REPAIR     LIGAMENT REPAIR     TONSILLECTOMY     Past Medical History:  Diagnosis Date   ADHD (attention deficit hyperactivity disorder)    Anorexia    Anxiety    Asthma    B12 deficiency    Bulimia    Depression    Morbid obesity    Treatment-resistant depression    BP 110/65   Pulse (!) 111   Ht 5\' 2"  (1.575 m)   Wt (!) 137.8 kg   SpO2 95%   BMI 55.57 kg/m   Opioid Risk Score:   Fall Risk Score:  `1  Depression screen [facility] 2/9     04/17/2022    3:14 PM 03/20/2022    3:22 PM 01/17/2022    3:03 PM 11/25/2021    2:51 PM 10/01/2021    2:14 PM  03/22/2021   11:20 AM 11/27/2020   11:46 AM  Depression screen PHQ 2/9  Decreased Interest 1 2 0 1 1 1 1   Down, Depressed, Hopeless 1 2 0 1 1 2 1   PHQ - 2 Score 2 4 0 2 2 3 2   Altered sleeping    2 3 3 1   Tired, decreased energy    2 3 2  1  Change in appetite    2 3 2 1   Feeling bad or failure about yourself     0 1 2 1   Trouble concentrating    1 2 0 1  Moving slowly or fidgety/restless    0 0 1 0  Suicidal thoughts    0 0    PHQ-9 Score    9 14 13 7   Difficult doing work/chores    Somewhat difficult Somewhat difficult      Review of Systems  Musculoskeletal:  Positive for back pain and neck pain.       Shoulder, hips, b/l legs  All other systems reviewed and are negative.      Objective:   Physical Exam  Gen: no distress, normal appearing HEENT: oral mucosa pink and moist, NCAT Abd: soft, non-distended Ext: no edema Psych: pleasant, normal affect Skin: intact Neuro: Alert and awake, follows commands, sensation intact light touch in all 4 extremities, strength No focal motor deficits, cranial nerves II through XII grossly intact No ataxia noted Musculoskeletal: Slump test negative no joint swelling noted Tenderness throughout periscapular muscles Trigger points b/l trapezius Tenderness throughout paraspinal muscles      10/13/21 CLINICAL DATA:  Low back pain. 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 Low back pain radiating to both legs, worsening over the last year.   EXAM: MRI LUMBAR SPINE WITHOUT AND WITH CONTRAST   TECHNIQUE: Multiplanar and multiecho pulse sequences of the lumbar spine were obtained without and with intravenous contrast.   CONTRAST:  10 cc view way.   COMPARISON:  Radiography 02/13/2021   FINDINGS: Segmentation: Transitional lumbosacral anatomy. Transitional vertebra is labeled as S1.   Alignment:  Minimal scoliotic curvature convex to the left.   Vertebrae:  No fracture or focal bone lesion.   Conus medullaris and cauda equina: Conus extends  to the L1 level. Conus and cauda equina appear normal.   Paraspinal and other soft tissues: Normal   Disc levels:   Disc levels are normal. No disc degeneration, bulge or herniation. No stenosis of the canal or foramina. No advanced facet arthropathy. Very mild facet joint degeneration and enhancement at L5-S1.   IMPRESSION: 1. Transitional lumbosacral anatomy. Transitional vertebra is labeled as S1. 2. Minimal scoliotic curvature convex to the left. 3. No disc degeneration or herniation. No stenosis of the canal or foramina. 4. Very mild facet degeneration and enhancement at L5-S1. This could relate to low back pain or referred facet syndrome pain.       Assessment & Plan:  Fibromyalgia/polyarthralgia -Currently suspect fibromyalgia is the most likely diagnosis.  She has had a extensive work-up by rheumatology and it does not appear that any abnormalities noted other than elevated ESR and CRP.  Presentation does not seem typical of polymyalgia rheumatica.  Unless alternative rheumatological condition is identified I think fibromyalgia is the most likely condition resulting in her body wide pain she also has associated depression and sleep disturbance. -Mental health provider changing Cymbalta to different medication -Continue Lyrica to 150mg  BID -Continue low impact gradual progressive exercise- limited by asthma currently -Discussed and provided list of foods to assist with pain control prior visit -Continue theracane or similar device   Patient here for trigger point injections for  Consent done and on chart.  Cleaned areas with alcohol and injected using a 27 gauge 1.5 inch needle  Injected  Using 1% Lidocaine with no EPI  Upper traps 2 areas bilaterally  There was no bleeding or complications     Depression -continue f/u  with mental health provider

## 2022-04-18 DIAGNOSIS — F509 Eating disorder, unspecified: Secondary | ICD-10-CM | POA: Diagnosis not present

## 2022-04-18 DIAGNOSIS — F439 Reaction to severe stress, unspecified: Secondary | ICD-10-CM | POA: Diagnosis not present

## 2022-04-18 DIAGNOSIS — F331 Major depressive disorder, recurrent, moderate: Secondary | ICD-10-CM | POA: Diagnosis not present

## 2022-04-19 ENCOUNTER — Encounter: Payer: Self-pay | Admitting: Physical Medicine & Rehabilitation

## 2022-04-24 DIAGNOSIS — F509 Eating disorder, unspecified: Secondary | ICD-10-CM | POA: Diagnosis not present

## 2022-04-24 DIAGNOSIS — F439 Reaction to severe stress, unspecified: Secondary | ICD-10-CM | POA: Diagnosis not present

## 2022-04-24 DIAGNOSIS — F331 Major depressive disorder, recurrent, moderate: Secondary | ICD-10-CM | POA: Diagnosis not present

## 2022-04-27 NOTE — Progress Notes (Deleted)
Office Visit Note  Patient: Emily [name]             Date of Birth: 07-14-2000           MRN: [id number]             PCP: Olive Bass, FNP Referring: Olive Bass,* Visit Date: 04/28/2022   Subjective:  No chief complaint on file.   History of Present Illness: Emily [name] is a 22 y.o. female here for follow up ***   Previous HPI 10/23/21 Emily [name] is a 22 y.o. female here for follow up for evaluation of chronic back pain and multiple other symptoms findings at last visit with persistent ESR and CRP elevations other tests all negative.  Sacrum and lumbar spine MRI was obtained and these were negative for any evidence of inflammatory sacroiliitis or active lumbar spine spondyloarthritis.  She continues having pain and stiffness in multiple areas and general fatigue.   Previous HPI 09/23/2021 Emily [name] is a 22 y.o. female here for follow up for evaluation of chronic back pain and multiple other symptoms findings at last visit with persistent ESR and CRP elevations other tests all negative.  She was unable to follow-up at originally scheduled appointment but has continued with similar severity of symptoms throughout this year.  She has had some changes made for treatment of anxiety and depression symptoms over the interval.  She continues use of NSAIDs with very incomplete symptom relief.  Pain is particularly bad at nighttime with difficulty falling and staying asleep.  She is taking up to 4 Flexeril tablets at night to sleep when symptoms are particularly bad.   Previous HPI 02/13/2021 Emily [name] is a 22 y.o. female here for follow up for evaluation of chronic back pain and multiple other symptoms findings at last visit with persistent ESR and CRP elevations other tests all negative. She continues to have back pain worst symptoms are lying in bed at night. She also has increased back pain first thing in the morning.    Previous HPI 01/17/21 Emily [name] is a 22 y.o. female here for evaluation of fatigue, joint pains, dry eyes with elevated ESR and CRP.  She has a chronic history of multiple symptoms going back to at least about 7 years ago.  At that time she had eating disorder with some problems attributed to malnutrition and this condition but have persisted despite resolution of her eating disorder problems.  Mostly with a lot of fatigue, intermittent paresthesias and sensitivity, insomnia, skin rashes, and body pains especially in the back and upper arms.  She feels these have overall just progressed with time and now feels like muscle and joint pains are frequently limiting activities.  She does not usually notice any joint swelling warmth or erythema.  Worst affected areas are in the upper and lower back bilaterally.  She is done some work with physical therapy without a long persistent benefit to symptoms. She takes Cymbalta primarily for anxiety and depression symptoms.  She takes naproxen as needed which is also partially helpful for joint and muscle pains. She has taken Flexeril in the past for muscular pain and trazodone for insomnia but feels this medicine did not do well with her.  She had previous sleep study that was negative for significant sleep apnea. Skin rashes on the face have been described as probably rosacea she has flushing with stress and with alcohol.  She maintains chronic mild rashes on  the arms sometimes eczema type symptoms.  She is frequently heat intolerance and feels hot with some skin flushing.  Denies discoloration in hands and feet such as pallor or cyanosis. Dry eye symptoms are a major complaint with some worsening over time for which she was concerned of possible sjogren's syndrome but negative serology to date. She uses eye drops for this currently and has discussed possible going to restasis treatment with her eye doctor. She does not have as much problem with mouth dryness,  although drinks water very frequently. She is also had chronic problems with interstitial cystitis apparently some structural abnormality with the ureters and decreased bladder volume.  Previous work-up including contrast CT study of the abdomen with some possible cystic changes but not thought to represent polycystic kidney disease.  Urinalysis has shown persistent proteinuria usually without other findings and renal function remains normal.   Labs reviewed 11/2020 ANA neg dsDNA, SSA, SSB neg RF neg CCP neg ESR 41, 43 CRP 24.8, 35.5 CBC WBC 11.6 Plts 493 CMP wnl TSH 2.64 UA +protein   05/2020 Ferritin 7 Vit D 21   No Rheumatology ROS completed.   PMFS History:  Patient Active Problem List   Diagnosis Date Noted   Recurrent major depression resistant to treatment 04/17/2021   Essential hypertension 04/17/2021   Acne rosacea 01/17/2021   Elevated C-reactive protein (CRP) 12/11/2020   Elevated sed rate 12/11/2020   Arthralgia 11/27/2020   Social anxiety disorder 08/02/2020   B12 deficiency 06/05/2020   Dry eye 05/25/2020   Vitamin D deficiency 05/25/2020   Epistaxis, recurrent 01/05/2020   Ankle weakness 12/01/2019   Mixed hyperlipidemia 12/01/2019   Paresthesia of skin 05/27/2019   BMI 50.0-59.9, adult 05/12/2019   Chronic fatigue 05/12/2019   Chronic left-sided low back pain without sciatica 12/02/2017   Interstitial cystitis (chronic) with hematuria 11/26/2017   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   Primary hypertension 03/25/2017   Midline thoracic back pain 09/27/2015   Acne vulgaris 09/27/2015   Anorexia nervosa, binge-eating purging type 04/11/2015   Dysmenorrhea 02/27/2015   Sleep disturbance 11/18/2014   Attention deficit hyperactivity disorder (ADHD), combined type 08/24/2014    Past Medical History:  Diagnosis Date   ADHD (attention deficit hyperactivity disorder)    Anorexia    Anxiety    Asthma    B12 deficiency    Bulimia    Depression     Morbid obesity    Treatment-resistant depression     Family History  Problem Relation Age of Onset   Anxiety disorder Mother    Drug abuse Father    Hyperlipidemia Maternal Grandmother    Hypertension Maternal Grandmother    Hyperlipidemia Maternal Grandfather    Hypertension Maternal Grandfather    Colon cancer Neg Hx    Pancreatic cancer Neg Hx    Esophageal cancer Neg Hx    Past Surgical History:  Procedure Laterality Date   ADENOIDECTOMY     BLADDER REPAIR     LIGAMENT REPAIR     TONSILLECTOMY     Social History   Social History Narrative   Not on file   Immunization History  Administered Date(s) Administered   DTaP 12/05/1998, 02/01/1999, 04/15/1999, 12/31/1999, 12/20/2002   HIB (PRP-OMP) 12/05/1998, 02/01/1999, 04/15/1999, 12/31/1999   HIB (PRP-T) 12/05/1998, 02/01/1999, 04/15/1999, 12/31/1999   HPV 9-valent 01/01/2010, 04/04/2010, 08/07/2010   HPV Quadrivalent 01/01/2010, 04/04/2010, 08/07/2010   Hepatitis A, Adult 12/23/2004, 10/13/2005   Hepatitis A, Ped/Adol-2 Dose 12/23/2004, 10/13/2005   Hepatitis B, PED/ADOLESCENT  29-Jan-1998, 10/30/1998, 07/01/1999   IPV 12/05/1998, 02/01/1999, 04/15/1999, 12/20/2002   Influenza Split 11/10/2011, 11/12/2012, 11/09/2013, 10/14/2018   Influenza,inj,Quad PF,6+ Mos 10/14/2018, 10/01/2021   Influenza,inj,Quad PF,6-35 Mos 11/25/2016   Influenza,inj,quad, With Preservative 10/03/2014   Influenza-Unspecified 11/25/2016   MMR 09/30/1999, 12/20/2002   Meningococcal B, OMV 11/25/2016, 01/29/2017   Meningococcal Conjugate 01/01/2010, 10/03/2014   Meningococcal Mcv4o 01/01/2010, 10/03/2014   Moderna Sars-Covid-2 Vaccination 02/14/2019, 04/03/2019   Pneumococcal Polysaccharide-23 12/05/1998, 02/01/1999, 04/15/1999, 12/31/1999   Pneumococcal-Unspecified 12/05/1998, 02/01/1999, 04/15/1999, 12/31/1999   Td 05/21/2009, 07/28/2019   Tdap 05/21/2009   Varicella 09/30/1999, 12/23/2004     Objective: Vital Signs: There were no vitals  taken for this visit.   Physical Exam   Musculoskeletal Exam: ***  CDAI Exam: CDAI Score: -- Patient Global: --; Provider Global: -- Swollen: --; Tender: -- Joint Exam 04/28/2022   No joint exam has been documented for this visit   There is currently no information documented on the homunculus. Go to the Rheumatology activity and complete the homunculus joint exam.  Investigation: No additional findings.  Imaging: No results found.  Recent Labs: Lab Results  Component Value Date   WBC 8.5 11/07/2021   HGB 12.8 11/07/2021   PLT 377.0 10/07/2021   NA 136 10/07/2021   K 4.5 10/07/2021   CL 103 10/07/2021   CO2 22 10/07/2021   GLUCOSE 87 10/07/2021   BUN 18 10/07/2021   CREATININE 0.75 10/07/2021   BILITOT 0.4 10/07/2021   ALKPHOS 33 (L) 10/07/2021   AST 11 10/07/2021   ALT 11 10/07/2021   PROT 6.9 10/07/2021   ALBUMIN 3.9 10/07/2021   CALCIUM 9.3 10/07/2021    Speciality Comments: No specialty comments available.  Procedures:  No procedures performed Allergies: Dust mite extract   Assessment / Plan:     Visit Diagnoses: No diagnosis found.  ***  Orders: No orders of the defined types were placed in this encounter.  No orders of the defined types were placed in this encounter.    Follow-Up Instructions: No follow-ups on file.   Fuller Plan, MD  Note - This record has been created using [employer].  Chart creation errors have been sought, but may not always  have been located. Such creation errors do not reflect on  the standard of medical care.

## 2022-04-28 ENCOUNTER — Ambulatory Visit: Payer: BC Managed Care – PPO | Admitting: Internal Medicine

## 2022-05-02 ENCOUNTER — Ambulatory Visit (INDEPENDENT_AMBULATORY_CARE_PROVIDER_SITE_OTHER): Payer: BC Managed Care – PPO | Admitting: Obstetrics and Gynecology

## 2022-05-02 ENCOUNTER — Encounter: Payer: Self-pay | Admitting: Obstetrics and Gynecology

## 2022-05-02 VITALS — BP 99/68 | HR 98 | Ht 64.2 in | Wt 302.8 lb

## 2022-05-02 DIAGNOSIS — Z0000 Encounter for general adult medical examination without abnormal findings: Secondary | ICD-10-CM

## 2022-05-02 DIAGNOSIS — R350 Frequency of micturition: Secondary | ICD-10-CM | POA: Diagnosis not present

## 2022-05-02 DIAGNOSIS — N3281 Overactive bladder: Secondary | ICD-10-CM

## 2022-05-02 DIAGNOSIS — R319 Hematuria, unspecified: Secondary | ICD-10-CM | POA: Diagnosis not present

## 2022-05-02 DIAGNOSIS — M797 Fibromyalgia: Secondary | ICD-10-CM | POA: Diagnosis not present

## 2022-05-02 DIAGNOSIS — F329 Major depressive disorder, single episode, unspecified: Secondary | ICD-10-CM | POA: Diagnosis not present

## 2022-05-02 DIAGNOSIS — N3010 Interstitial cystitis (chronic) without hematuria: Secondary | ICD-10-CM | POA: Diagnosis not present

## 2022-05-02 DIAGNOSIS — M2550 Pain in unspecified joint: Secondary | ICD-10-CM | POA: Diagnosis not present

## 2022-05-02 LAB — POCT URINALYSIS DIPSTICK
Bilirubin, UA: NEGATIVE
Glucose, UA: NEGATIVE
Ketones, UA: NEGATIVE
Leukocytes, UA: NEGATIVE
Nitrite, UA: NEGATIVE
Protein, UA: NEGATIVE
Spec Grav, UA: 1.025 (ref 1.010–1.025)
Urobilinogen, UA: 0.2 E.U./dL
pH, UA: 6.5 (ref 5.0–8.0)

## 2022-05-02 LAB — URINALYSIS, ROUTINE W REFLEX MICROSCOPIC
Bilirubin Urine: NEGATIVE
Glucose, UA: NEGATIVE mg/dL
Ketones, ur: NEGATIVE mg/dL
Leukocytes,Ua: NEGATIVE
Nitrite: NEGATIVE
Protein, ur: NEGATIVE mg/dL
Specific Gravity, Urine: 1.013 (ref 1.005–1.030)
pH: 6 (ref 5.0–8.0)

## 2022-05-02 MED ORDER — SOLIFENACIN SUCCINATE 5 MG PO TABS: 5.0000 mg | ORAL_TABLET | Freq: Every day | ORAL | 5 refills | Status: DC

## 2022-05-02 NOTE — Patient Instructions (Signed)
Today we talked about ways to manage bladder urgency such as altering your diet to avoid irritative beverages and foods (bladder diet) as well as attempting to decrease stress and other exacerbating factors.  You can also chew a plain Tums 1-3 times per day to make your urine less acidic, especially if you have eating/drinking acidic things.   There is a website with helpful information for people with bladder irritation, called the IC Network at https://www.ic-network.com. This website has more information about a healthy bladder diet and patient forums for support.  The Most Bothersome Foods* The Least Bothersome Foods*  Coffee - Regular & Decaf Tea - caffeinated Carbonated beverages - cola, non-colas, diet & caffeine-free Alcohols - Beer, Red Wine, White Wine, [address] - Grapefruit, [place], Orange, [employer] - Cranberry, Grapefruit, Orange, Pineapple Vegetables - Tomato & Tomato Products Flavor Enhancers - Hot peppers, Spicy foods, Chili, Horseradish, Vinegar, Monosodium glutamate (MSG) Artificial Sweeteners - NutraSweet, Sweet 'N Low, Equal (sweetener), Saccharin Ethnic foods - [place], [place], [place] food [employer] - low-fat & whole Fruits - Bananas, Blueberries, Honeydew melon, Pears, Raisins, Watermelon Vegetables - Broccoli, [address] Sprouts, [place], Carrots, Cauliflower, [place], Cucumber, Mushrooms, Peas, Radishes, Squash, Zucchini, White potatoes, Sweet potatoes & yams Poultry - Chicken, Eggs, [place], [employer] - Beef, Diplomatic Services operational officer, Lamb Seafood - Shrimp, [place] fish, Salmon Grains - Oat, Rice Snacks - Pretzels, Popcorn  *Lenward Chancellor et al. Diet and its role in interstitial cystitis/bladder pain syndrome (IC/BPS) and comorbid conditions. [employer]. BJU Int. 2012 Jan 11.     Interstitial Cystitis/ Bladder Pain treatment:  Avoid irriative foods and beverages and identify other triggers Work to decrease stress- meditation, yoga Medications for bladder pain:  pyridium (Azo), methenamine, Uribel Medications: amitiptyline, hydroxyzine (anti-histamine), tums, L-arginine, [place]  Bladder instillations for pain flares Cystoscopy with hydrodistention   Additional treatments to try for relief of IC symptoms:  Over the counter natural supplements: -Bladder Ease by [employer], Bladder Rest or Bladder Builder (all contain L-arginine) - helps to rebuild protective bladder layer and reduce bladder pain -Marshmallow Root (relieves inflammation in the urinary tract) - pills available or drink as a tea -Aloe Vera: [place] [employer]vest Aloe Vera capsules,  AloePath (also contains L-arginine and calcium cabonate)- can take several months to see improvement -Fish oil - 4 g daily -Tumeric (standardized to 95% curcuminoids) - 500 mg three times daily
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
## 2022-05-05 NOTE — Patient Instructions (Incomplete)
1. Moderate persistent asthma  -Since your insurance will end at the end of this month, I will hold off on trying to get Tezspire approved. When you get your new insurance card, come by our office so we can get a copy of it and try to get Tezspire approved. - Daily controller medication(s): Breztri two puffs twice daily with spacer to help prevent cough and wheeze. Rinse mouth - Prior to physical activity: albuterol 2 puffs 10-15 minutes before physical activity. - Rescue medications: albuterol 4 puffs every 4-6 hours as needed - Asthma control goals:  * Full participation in all desired activities (may need albuterol before activity) * Albuterol use two time or less a week on average (not counting use with activity) * Cough interfering with sleep two time or less a month * Oral steroids no more than once a year * No hospitalizations  2. Perennial allergic rhinitis - lab testing on  11/07/21 positive to dust mite - Continue  Nasacort one spray per nostril twice daily for stuffy nose AND continue Xyzal  daily as needed for runny nose - You can take an extra dose of antihistamines if needed on bad days.   3. Gustatory rhinitis Consider starting ipratropium bromide 0.03% when you get your new insurance.Use 1-2 sprays  as needed in each nostril 10-15 minutes before breakfast, lunch and dinner. Caution as this can be drying  4.  Schedule a follow up appointment in 3 months or sooner if needed

## 2022-05-06 ENCOUNTER — Other Ambulatory Visit: Payer: Self-pay

## 2022-05-06 ENCOUNTER — Ambulatory Visit: Payer: BC Managed Care – PPO | Admitting: Family

## 2022-05-06 ENCOUNTER — Encounter: Payer: Self-pay | Admitting: Family

## 2022-05-06 VITALS — BP 130/70 | HR 113 | Temp 98.5°F | Resp 12 | Wt 301.7 lb

## 2022-05-06 DIAGNOSIS — J4540 Moderate persistent asthma, uncomplicated: Secondary | ICD-10-CM | POA: Diagnosis not present

## 2022-05-06 DIAGNOSIS — J3089 Other allergic rhinitis: Secondary | ICD-10-CM | POA: Diagnosis not present

## 2022-05-06 DIAGNOSIS — J310 Chronic rhinitis: Secondary | ICD-10-CM

## 2022-05-06 MED ORDER — IPRATROPIUM BROMIDE 0.03 % NA SOLN: NASAL | 2 refills | Status: DC

## 2022-05-06 NOTE — Addendum Note (Signed)
Addended by: Rolland Bimler D on: 05/06/2022 05:43 PM   Modules accepted: Orders

## 2022-05-06 NOTE — Progress Notes (Signed)
[address] [place] [address] Dept: [phone]  FOLLOW UP NOTE  Patient ID: Emily [name], female    DOB: September 11, 2000  Age: 22 y.o. MRN: [id number] Date of Office Visit: 05/06/2022  Assessment  Chief Complaint: Follow-up (Discuss Tezspire shot)  HPI Emily [name] is a 22 year old female who presents today for follow-up of not well-controlled moderate persistent asthma, perennial allergic rhinitis, and gustatory rhinitis.  She was last seen on April 08, 2022 by myself.  Since her last office visit she denies any new diagnosis or surgery since her last office visit.  Moderate persistent asthma: She continues to take Breztri 2 puffs twice a day with spacer and albuterol as needed.  She reports a little bit of coughing, tightness in her chest, and shortness of breath.  She denies fever, chills, cough and nocturnal awakenings due to breathing problems.  Since her last office visit she has not required any systemic steroids or made any trips to the emergency room or urgent care due to breathing problems.  She has been using her albuterol inhaler preemptively before doing things such as running errands or lifting.  Discussed trying to use her albuterol as needed rather than preemptively for things such as lifting or running errands.  She also feels like her asthma is worse on extreme warm days and cold weather.  She has not used her albuterol this weekend or today.  She is interested in starting Tezspire injections, but mentions that her current insurance will end at the end of this month and she  will have a new insurance.  Perennial allergic rhinitis: She reports clear rhinorrhea, nasal congestion, and a little postnasal drip.  She takes Xyzal 5 mg daily and Nasacort 1 spray each nostril twice a day.  She has not had any sinus infections since we last saw her.  Gustatory rhinitis: She reports that she never received the prescription for ipratropium bromide 0.03% nasal spray.  She  figures that the insurance did not cover it.  She would like to wait and see if her new insurance at the beginning of next month covers it.   Drug Allergies:  Allergies  Allergen Reactions   Dust Mite Extract Other (See Comments)    Sneezing, coughing, runny nose  Sneezing, coughing, runny nose, Sneezing, coughing, runny nose    Review of Systems: Review of Systems  Constitutional:  Negative for chills and fever.  HENT:         Reports clear rhinorrhea, nasal congestion, and a little postnasal drip  Eyes:        Reports a little itchy watery eyes  Respiratory:  Positive for shortness of breath and wheezing. Negative for cough.        Reports little wheezing, tightness in her chest, and shortness of breath.  Denies coughing and nocturnal awakenings  Cardiovascular:  Negative for chest pain and palpitations.  Gastrointestinal:        His heartburn or reflux symptoms  Skin:        She reports that her cheeks get flushed when she is anxious  Neurological:  Positive for headaches.       Reports headaches.  Has an appointment with neurology in June  Endo/Heme/Allergies:  Positive for environmental allergies.     Physical Exam: BP 130/70   Pulse (!) 113   Temp 98.5 F (36.9 C) (Temporal)   Resp 12   Wt (!) 301 lb 11.2 oz (136.9 kg)   SpO2 96%  BMI 51.46 kg/m    Physical Exam Constitutional:      Appearance: Normal appearance.  HENT:     Head: Normocephalic and atraumatic.     Comments: Pharynx normal. Eyes normal. Ears normal. Nose normal    Right Ear: Tympanic membrane, ear canal and external ear normal.     Left Ear: Tympanic membrane, ear canal and external ear normal.     Nose: Nose normal.     Mouth/Throat:     Mouth: Mucous membranes are moist.     Pharynx: Oropharynx is clear.  Eyes:     Conjunctiva/sclera: Conjunctivae normal.  Cardiovascular:     Rate and Rhythm: Regular rhythm.     Heart sounds: Normal heart sounds.  Pulmonary:     Effort: Pulmonary  effort is normal.     Breath sounds: Normal breath sounds.     Comments: Lungs clear to ascultation Musculoskeletal:     Cervical back: Neck supple.  Skin:    General: Skin is warm.     Comments: Cheeks flushed  Neurological:     Mental Status: She is alert and oriented to person, place, and time.  Psychiatric:        Mood and Affect: Mood normal.        Behavior: Behavior normal.        Thought Content: Thought content normal.        Judgment: Judgment normal.     Diagnostics: FVC 3.87 L (109%), FEV1 3.46 L (111%).  Spirometry indicates normal spirometry.  Assessment and Plan: 1. Not well controlled moderate persistent asthma   2. Perennial allergic rhinitis   3. Gustatory rhinitis     Meds ordered this encounter  Medications   ipratropium (ATROVENT) 0.03 % nasal spray    Sig: Place 1-2 sprays as needed for runny drippy nose in each nostril 10-15 minutes before breakfast, lunch and dinner. Caution can be drying    Dispense:  30 mL    Refill:  2    Patient Instructions  1. Moderate persistent asthma  -Since your insurance will end at the end of this month, I will hold off on trying to get Tezspire approved. When you get your new insurance card, come by our office so we can get a copy of it and try to get Tezspire approved. - Daily controller medication(s): Breztri two puffs twice daily with spacer to help prevent cough and wheeze. Rinse mouth - Prior to physical activity: albuterol 2 puffs 10-15 minutes before physical activity. - Rescue medications: albuterol 4 puffs every 4-6 hours as needed - Asthma control goals:  * Full participation in all desired activities (may need albuterol before activity) * Albuterol use two time or less a week on average (not counting use with activity) * Cough interfering with sleep two time or less a month * Oral steroids no more than once a year * No hospitalizations  2. Perennial allergic rhinitis - lab testing on  11/07/21 positive  to dust mite - Continue  Nasacort one spray per nostril twice daily for stuffy nose AND continue Xyzal  daily as needed for runny nose - You can take an extra dose of antihistamines if needed on bad days.   3. Gustatory rhinitis Consider starting ipratropium bromide 0.03% when you get your new insurance.Use 1-2 sprays  as needed in each nostril 10-15 minutes before breakfast, lunch and dinner. Caution as this can be drying  4.  Schedule a follow up appointment in 3 months or  sooner if needed   Return in about 3 months (around 08/05/2022), or if symptoms worsen or fail to improve.    Thank you for the opportunity to care for this patient.  Please do not hesitate to contact me with questions.  Nehemiah Settle, FNP [facility]

## 2022-05-08 DIAGNOSIS — F509 Eating disorder, unspecified: Secondary | ICD-10-CM | POA: Diagnosis not present

## 2022-05-08 DIAGNOSIS — F331 Major depressive disorder, recurrent, moderate: Secondary | ICD-10-CM | POA: Diagnosis not present

## 2022-05-08 DIAGNOSIS — F411 Generalized anxiety disorder: Secondary | ICD-10-CM | POA: Diagnosis not present

## 2022-05-08 DIAGNOSIS — F4010 Social phobia, unspecified: Secondary | ICD-10-CM | POA: Diagnosis not present

## 2022-05-08 DIAGNOSIS — F439 Reaction to severe stress, unspecified: Secondary | ICD-10-CM | POA: Diagnosis not present

## 2022-05-08 DIAGNOSIS — F341 Dysthymic disorder: Secondary | ICD-10-CM | POA: Diagnosis not present

## 2022-06-03 ENCOUNTER — Ambulatory Visit: Payer: No Typology Code available for payment source | Admitting: Family

## 2022-06-03 ENCOUNTER — Encounter: Payer: Self-pay | Admitting: Family

## 2022-06-03 VITALS — BP 130/82 | HR 88 | Ht 64.2 in

## 2022-06-03 DIAGNOSIS — F5002 Anorexia nervosa, binge eating/purging type: Secondary | ICD-10-CM

## 2022-06-03 DIAGNOSIS — I10 Essential (primary) hypertension: Secondary | ICD-10-CM | POA: Diagnosis not present

## 2022-06-03 DIAGNOSIS — K219 Gastro-esophageal reflux disease without esophagitis: Secondary | ICD-10-CM | POA: Diagnosis not present

## 2022-06-03 MED ORDER — LISINOPRIL 10 MG PO TABS
10.0000 mg | ORAL_TABLET | Freq: Every day | ORAL | 1 refills | Status: AC
Start: 2022-06-03 — End: ?

## 2022-06-03 MED ORDER — OMEPRAZOLE 20 MG PO CPDR
20.0000 mg | DELAYED_RELEASE_CAPSULE | Freq: Every day | ORAL | 1 refills | Status: AC
Start: 2022-06-03 — End: ?

## 2022-06-03 NOTE — Patient Instructions (Signed)
  [name] Tinnie Gens is a primary care provider. You do not need a referral for [place]. You need to call and establish care with her.   [facility]  [address]  [place], [place] [address]  [phone]

## 2022-06-03 NOTE — Progress Notes (Signed)
Emily [name] is a 22 y.o. female with the following history as recorded in EpicCare:  Patient Active Problem List   Diagnosis Date Noted   Recurrent major depression resistant to treatment (HCC) 04/17/2021   Essential hypertension 04/17/2021   Acne rosacea 01/17/2021   Elevated C-reactive protein (CRP) 12/11/2020   Elevated sed rate 12/11/2020   Arthralgia 11/27/2020   Social anxiety disorder 08/02/2020   B12 deficiency 06/05/2020   Dry eye 05/25/2020   Vitamin D deficiency 05/25/2020   Epistaxis, recurrent 01/05/2020   Ankle weakness 12/01/2019   Mixed hyperlipidemia 12/01/2019   Paresthesia of skin 05/27/2019   BMI 50.0-59.9, adult (HCC) 05/12/2019   Chronic fatigue 05/12/2019   Chronic left-sided low back pain without sciatica 12/02/2017   Interstitial cystitis (chronic) with hematuria 11/26/2017   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   Primary hypertension 03/25/2017   Midline thoracic back pain 09/27/2015   Acne vulgaris 09/27/2015   Anorexia nervosa, binge-eating purging type 04/11/2015   Dysmenorrhea 02/27/2015   Sleep disturbance 11/18/2014   Attention deficit hyperactivity disorder (ADHD), combined type 08/24/2014    Current Outpatient Medications  Medication Sig Dispense Refill   albuterol (VENTOLIN HFA) 108 (90 Base) MCG/ACT inhaler INHALE 1-2 PUFFS BY MOUTH EVERY 6 HOURS AS NEEDED FOR WHEEZE OR SHORTNESS OF BREATH 18 each 1   ALTAVERA 0.15-30 MG-MCG tablet TAKE 1 TABLET BY MOUTH EVERY DAY 112 tablet 1   atomoxetine (STRATTERA) 60 MG capsule Take 60 mg by mouth daily.     BREZTRI AEROSPHERE 160-9-4.8 MCG/ACT AERO Inhale 2 puffs into the lungs in the morning and at bedtime. 10.7 g 5   cyclobenzaprine (FLEXERIL) 10 MG tablet Take 1 tablet (10 mg total) by mouth 3 (three) times daily as needed for muscle spasms. 30 tablet 0   desvenlafaxine (PRISTIQ) 100 MG 24 hr tablet Take 100 mg by mouth daily.     hydrOXYzine (VISTARIL) 50 MG capsule Take 50 mg by mouth  at bedtime.     ipratropium (ATROVENT) 0.03 % nasal spray Place 1-2 sprays as needed for runny drippy nose in each nostril 10-15 minutes before breakfast, lunch and dinner. Caution can be drying 30 mL 2   naproxen (NAPROSYN) 500 MG tablet TAKE 1 TABLET BY MOUTH TWICE A DAY AS NEEDED 60 tablet 1   pregabalin (LYRICA) 150 MG capsule Take 1 capsule (150 mg total) by mouth 2 (two) times daily. 30 capsule 4   QELBREE 100 MG 24 hr capsule Take 100 mg by mouth every morning.     QUEtiapine (SEROQUEL) 50 MG tablet Take 50 mg by mouth at bedtime.     solifenacin (VESICARE) 5 MG tablet Take 1 tablet (5 mg total) by mouth daily. 30 tablet 5   lisinopril (ZESTRIL) 10 MG tablet Take 1 tablet (10 mg total) by mouth daily. 90 tablet 1   omeprazole (PRILOSEC) 20 MG capsule Take 1 capsule (20 mg total) by mouth daily. 90 capsule 1   QUEtiapine (SEROQUEL) 25 MG tablet Take 25 mg by mouth at bedtime. (Patient not taking: Reported on 06/03/2022)     No current facility-administered medications for this visit.    Allergies: Dust mite extract  Past Medical History:  Diagnosis Date   ADHD (attention deficit hyperactivity disorder)    Anorexia    Anxiety    Asthma    B12 deficiency    Bulimia    Depression    Morbid obesity (HCC)    Treatment-resistant depression     Past Surgical  History:  Procedure Laterality Date   ADENOIDECTOMY     BLADDER REPAIR     LIGAMENT REPAIR     TONSILLECTOMY      Family History  Problem Relation Age of Onset   Anxiety disorder Mother    Drug abuse Father    Hyperlipidemia Maternal Grandmother    Hypertension Maternal Grandmother    Hyperlipidemia Maternal Grandfather    Hypertension Maternal Grandfather    Colon cancer Neg Hx    Pancreatic cancer Neg Hx    Esophageal cancer Neg Hx     Social History   Tobacco Use   Smoking status: Never    Passive exposure: Yes   Smokeless tobacco: Never   Tobacco comments:    mother smokes outside home  Substance Use Topics    Alcohol use: Yes    Alcohol/week: 0.0 standard drinks of alcohol    Comment: occasional    Subjective:   Accompanied by her mother; Would like referral to change her PCP to provider Porfirio Oar- has been told by her therapist that Emily [name] would be able to help manage eating disorder needs; when she contacted that office was told that she needed a referral? Also requesting short term refills on her blood pressure and reflux medication until she is able to see her new PCP; no acute concerns today; Denies any chest pain, shortness of breath, blurred vision or headache   Objective:  Vitals:   06/03/22 1308  BP: 130/82  Pulse: 88  SpO2: 97%  Height: 5' 4.2" (1.631 m)    General: Well developed, well nourished, in no acute distress  Skin : Warm and dry.  Head: Normocephalic and atraumatic  Lungs: Respirations unlabored; clear to auscultation bilaterally without wheeze, rales, rhonchi  CVS exam: normal rate and regular rhythm.  Neurologic: Alert and oriented; speech intact; face symmetrical; moves all extremities well; CNII-XII intact without focal deficit   Assessment:  1. Anorexia nervosa, binge-eating purging type   2. Primary hypertension   3. Gastroesophageal reflux disease, unspecified whether esophagitis present     Plan:  Referral updated to new PCP as requested by patient; Refill updated per patient request; Refill updated per patient request;   No follow-ups on file.  Orders Placed This Encounter  Procedures   Ambulatory Referral to Primary Care    Referral Priority:   Routine    Referral Type:   Consultation    Referral Reason:   Specialty Services Required    Number of Visits Requested:   1    Requested Prescriptions   Signed Prescriptions Disp Refills   lisinopril (ZESTRIL) 10 MG tablet 90 tablet 1    Sig: Take 1 tablet (10 mg total) by mouth daily.   omeprazole (PRILOSEC) 20 MG capsule 90 capsule 1    Sig: Take 1 capsule (20 mg total) by mouth daily.

## 2022-06-11 NOTE — Progress Notes (Unsigned)
[facility] Return Visit  SUBJECTIVE  History of Present Illness: Emily [name] is a 22 y.o. female seen in follow-up for OAB and urinary frequency. Plan at last visit was start pelvic floor PT and start Vesicare 5mg  daily.    Still going 10 times daily and getting up 3-4 times a night.    Past Medical History: Patient  has a past medical history of ADHD (attention deficit hyperactivity disorder), Anorexia, Anxiety, Asthma, B12 deficiency, Bulimia, Depression, Morbid obesity (HCC), and Treatment-resistant depression.   Past Surgical History: She  has a past surgical history that includes Tonsillectomy; Adenoidectomy; Ligament repair; and Bladder repair.   Medications: She has a current medication list which includes the following prescription(s): albuterol, altavera, atomoxetine, breztri aerosphere, cyclobenzaprine, desvenlafaxine, hydroxyzine, ipratropium, lisinopril, naproxen, omeprazole, pregabalin, qelbree, quetiapine, quetiapine, solifenacin, and [DISCONTINUED] pantoprazole.   Allergies: Patient is allergic to dust mite extract.   Social History: Patient  reports that she has never smoked. She has been exposed to tobacco smoke. She has never used smokeless tobacco. She reports current alcohol use. She reports that she does not use drugs.      OBJECTIVE     Physical Exam: Vitals:   06/12/22 1443  BP: 103/68  Pulse: 90   Gen: No apparent distress, A&O x 3.  Detailed Urogynecologic Evaluation:  Deferred.    ASSESSMENT AND PLAN    Emily [name] is a 22 y.o. with:  1. Overactive bladder   2. Urinary frequency   3. Annual physical exam    Patient is not able to afford PT at this time and has had an insurance change. She reports some mild improvement with the Vesicare 5mg  so we will increase to 10mg . She can double what she is currently taking until she is out and a new prescription sent for 10mg  daily.  She will try some self directed physical  movements for frequency and overactive bladder.  New referral placed for Dr. Rondel Baton office to establish care.   Patient to follow up in 6 months or sooner if needed.

## 2022-06-12 ENCOUNTER — Encounter: Payer: Self-pay | Admitting: Obstetrics and Gynecology

## 2022-06-12 ENCOUNTER — Ambulatory Visit (INDEPENDENT_AMBULATORY_CARE_PROVIDER_SITE_OTHER): Payer: No Typology Code available for payment source | Admitting: Obstetrics and Gynecology

## 2022-06-12 VITALS — BP 103/68 | HR 90

## 2022-06-12 DIAGNOSIS — R350 Frequency of micturition: Secondary | ICD-10-CM | POA: Diagnosis not present

## 2022-06-12 DIAGNOSIS — Z0000 Encounter for general adult medical examination without abnormal findings: Secondary | ICD-10-CM

## 2022-06-12 DIAGNOSIS — N3281 Overactive bladder: Secondary | ICD-10-CM | POA: Diagnosis not present

## 2022-06-12 MED ORDER — SOLIFENACIN SUCCINATE 10 MG PO TABS: 10.0000 mg | ORAL_TABLET | Freq: Every day | ORAL | 2 refills | Status: DC

## 2022-06-12 NOTE — Patient Instructions (Addendum)
Double your Vesicare 5mg  (10mg ) until you complete it  You can also add in Pumpkin seed extract (Up to 5gm daily) and l-argigine  for your IC  Send me a message with how the 10mg  Assunta Found is working.   Goal 6-8 times of urinating daily and up 1 time at night.

## 2022-06-13 ENCOUNTER — Encounter: Payer: No Typology Code available for payment source | Admitting: Physical Medicine & Rehabilitation

## 2022-06-24 ENCOUNTER — Other Ambulatory Visit: Payer: Self-pay | Admitting: Physical Medicine & Rehabilitation

## 2022-07-02 ENCOUNTER — Ambulatory Visit: Payer: No Typology Code available for payment source | Admitting: Neurology

## 2022-07-11 ENCOUNTER — Encounter: Payer: No Typology Code available for payment source | Admitting: Physical Medicine & Rehabilitation

## 2022-07-30 ENCOUNTER — Other Ambulatory Visit: Payer: Self-pay | Admitting: Family

## 2022-07-30 DIAGNOSIS — N946 Dysmenorrhea, unspecified: Secondary | ICD-10-CM

## 2022-07-31 ENCOUNTER — Encounter: Payer: Self-pay | Admitting: Obstetrics and Gynecology

## 2022-07-31 ENCOUNTER — Other Ambulatory Visit: Payer: Self-pay | Admitting: Obstetrics and Gynecology

## 2022-07-31 DIAGNOSIS — N946 Dysmenorrhea, unspecified: Secondary | ICD-10-CM

## 2022-07-31 MED ORDER — LEVONORGESTREL-ETHINYL ESTRAD 0.15-30 MG-MCG PO TABS
1.0000 | ORAL_TABLET | Freq: Every day | ORAL | 0 refills | Status: DC
Start: 2022-07-31 — End: 2023-10-06

## 2022-07-31 NOTE — Progress Notes (Signed)
Patient reports that she is attempting to get established with OB-GYN and needs a refill on her Birth-control. One time order for BCP's sent in for patient to assist her with her transitioning to a different OB-GYN.

## 2022-08-05 ENCOUNTER — Ambulatory Visit: Payer: BC Managed Care – PPO | Admitting: Family

## 2022-08-08 ENCOUNTER — Encounter
Payer: No Typology Code available for payment source | Attending: Physical Medicine & Rehabilitation | Admitting: Physical Medicine & Rehabilitation

## 2022-08-08 ENCOUNTER — Encounter: Payer: Self-pay | Admitting: Physical Medicine & Rehabilitation

## 2022-08-08 VITALS — BP 105/67 | HR 100 | Ht 64.0 in | Wt 295.0 lb

## 2022-08-08 DIAGNOSIS — M797 Fibromyalgia: Secondary | ICD-10-CM

## 2022-08-08 DIAGNOSIS — M25572 Pain in left ankle and joints of left foot: Secondary | ICD-10-CM | POA: Diagnosis not present

## 2022-08-08 DIAGNOSIS — F329 Major depressive disorder, single episode, unspecified: Secondary | ICD-10-CM | POA: Diagnosis not present

## 2022-08-08 DIAGNOSIS — M722 Plantar fascial fibromatosis: Secondary | ICD-10-CM

## 2022-08-08 DIAGNOSIS — G8929 Other chronic pain: Secondary | ICD-10-CM | POA: Diagnosis present

## 2022-08-08 NOTE — Progress Notes (Signed)
Subjective:    Patient ID: Emily [name], female    DOB: 16-Dec-2000, 22 y.o.   MRN: [id number]  HPI HPI 11/25/2021  Emily [name] is a 22 y.o. year old female  who  has a past medical history of ADHD (attention deficit hyperactivity disorder), Anorexia, Anxiety, Asthma, B12 deficiency, Bulimia, Depression, Morbid obesity (HCC), and Treatment-resistant depression.   They are presenting to PM&R clinic as a new patient for pain management evaluation.  Emily [name] has pain that is worse in her back that is stabbing aching in quality.  She has had this pain for about 8 years.  Pain interferes with her sleep.  While her back is the worst source of her pain she has pain throughout her entire body.  Gentle stretching will improve her pain.   Pain is worsened by sitting.  Pain is also increased after she stands for several minutes.  Her activity is decreased due to the pain.   She reports she would like to avoid narcotic pain medications if possible.  She reports that she has had family members who have been addicted to these types of medications.   She has been followed by rheumatology due to ESR and CRP elevations however other tests have been negative.  It does not appear that she has yet been diagnosed with any specific rheumatological condition.  She has a lot of fatigue and has insomnia with significant difficulty falling and staying asleep.  She will often have paresthesias in her arms and legs.  She has a history of depression and is on Cymbalta.  She also had TMS in September with benefit to her mood.  No SI or HI.  No bowel or bladder changes although she has chronic interstitial cystitis.     Medications tried: -Duloxetine 60mg  BID - currently using for mood -Flexeril- helped at first, not much now -Naproxone-helped at first, not much now -Tylenol- no benefit -Ibuprofen- no benefit -Oxycodone for foot surgery helped her foot pain at the time -Prednisione used in the the past  with limited benefit   Other treatments: Physical therapy did not provide significant benefit Lyrica helping Weaning off duloxetine to prestiq     Interval History 01/17/22 Medicine while in house is here for follow-up regarding her chronic pain.  She reports needing mild improvement in her overall pain since starting Lyrica.  She has not had any significant side effects with the medication.  Her mental health provider is planning to change her from duloxetine to a different medication.  She has not tried a TENS unit previously.  Interval History 03/19/21 Emily [name] is here for her chronic pain follow-up.  She reports the pain in her hips and lower back is doing better with Lyrica.  She has less soreness in her arms and legs.  She continues to have occasional shooting pain down the legs.  She denies any side effects with Lyrica.  She continues to have pain around her neck and traps.  Aleve does provide benefit to her pain.  She has been attempting to work on home exercise with walking, she thinks this is beneficial.  Interval History 03/19/21 Patient is here for trigger point injection to her trapezius muscles where she was noted to have several trigger points.  Patient reports Lyrica continues to improve her pain and not causing any significant side effects.  She has tried Thera cane however she finds it is too painful.  We discussed   Interval history 08/08/2022 Emily [name] is  here for follow-up regarding her chronic pain.  She feels like pain in her upper back and trapezius muscles is better since having trigger point injections completed.  She did note that injections were more expensive than she expected.  She reports that Lyrica is helping her pain in her lower back and hips.  Overall pain is much more tolerable than it was several months ago.  Patient continues to have some pain in her feet, reports history of plantar fasciitis and history of left ankle surgery.  Pain Inventory Average  Pain 6 Pain Right Now 4 My pain is burning, dull, and aching  In the last 24 hours, has pain interfered with the following? General activity 7 Relation with others 6 Enjoyment of life 6 What TIME of day is your pain at its worst? evening and night Sleep (in general) NA  Pain is worse with: sitting, inactivity, and standing Pain improves with: therapy/exercise and medication Relief from Meds: 5  Family History  Problem Relation Age of Onset   Anxiety disorder Mother    Drug abuse Father    Hyperlipidemia Maternal Grandmother    Hypertension Maternal Grandmother    Hyperlipidemia Maternal Grandfather    Hypertension Maternal Grandfather    Colon cancer Neg Hx    Pancreatic cancer Neg Hx    Esophageal cancer Neg Hx    Social History   Socioeconomic History   Marital status: Single    Spouse name: Not on file   Number of children: Not on file   Years of education: Not on file   Highest education level: Not on file  Occupational History   Not on file  Tobacco Use   Smoking status: Never    Passive exposure: Yes   Smokeless tobacco: Never   Tobacco comments:    mother smokes outside home  Vaping Use   Vaping status: Never Used  Substance and Sexual Activity   Alcohol use: Yes    Alcohol/week: 0.0 standard drinks of alcohol    Comment: occasional   Drug use: No   Sexual activity: Never    Partners: Male    Comment: Female partners  Other Topics Concern   Not on file  Social History Narrative   Not on file   Social Determinants of Health   Financial Resource Strain: Not on file  Food Insecurity: Not on file  Transportation Needs: Not on file  Physical Activity: Not on file  Stress: Not on file  Social Connections: Unknown (07/30/2022)   Received from [facility]   Social Network    Social Network: Not on file   Past Surgical History:  Procedure Laterality Date   ADENOIDECTOMY     BLADDER REPAIR     LIGAMENT REPAIR     TONSILLECTOMY     Past Surgical  History:  Procedure Laterality Date   ADENOIDECTOMY     BLADDER REPAIR     LIGAMENT REPAIR     TONSILLECTOMY     Past Medical History:  Diagnosis Date   ADHD (attention deficit hyperactivity disorder)    Anorexia    Anxiety    Asthma    B12 deficiency    Bulimia    Depression    Morbid obesity (HCC)    Treatment-resistant depression    There were no vitals taken for this visit.  Opioid Risk Score:   Fall Risk Score:  `1  Depression screen [facility] 2/9     06/03/2022    1:13 PM 04/17/2022  3:14 PM 03/20/2022    3:22 PM 01/17/2022    3:03 PM 11/25/2021    2:51 PM 10/01/2021    2:14 PM 03/22/2021   11:20 AM  Depression screen PHQ 2/9  Decreased Interest 0 1 2 0 1 1 1   Down, Depressed, Hopeless 0 1 2 0 1 1 2   PHQ - 2 Score 0 2 4 0 2 2 3   Altered sleeping     2 3 3   Tired, decreased energy     2 3 2   Change in appetite     2 3 2   Feeling bad or failure about yourself      0 1 2  Trouble concentrating     1 2 0  Moving slowly or fidgety/restless     0 0 1  Suicidal thoughts     0 0   PHQ-9 Score     9 14 13   Difficult doing work/chores     Somewhat difficult Somewhat difficult     Review of Systems  Constitutional: Negative.   HENT: Negative.    Eyes: Negative.   Respiratory: Negative.    Cardiovascular: Negative.   Gastrointestinal: Negative.   Endocrine: Negative.   Genitourinary: Negative.   Musculoskeletal:  Positive for back pain and neck pain.       Shoulder, hips, b/l legs  Skin: Negative.   Allergic/Immunologic: Negative.   Neurological: Negative.   Hematological: Negative.   Psychiatric/Behavioral:  Positive for dysphoric mood.   All other systems reviewed and are negative.      Objective:   Physical Exam  Gen: no distress, normal appearing HEENT: oral mucosa pink and moist, NCAT Abd: soft, non-distended Ext: no edema Psych: pleasant, normal affect Skin: intact Neuro: Alert and awake, follows commands, sensation intact light touch in all 4  extremities, strength No focal motor deficits, cranial nerves II through XII grossly intact No ataxia noted Musculoskeletal: Slump test negative no joint swelling noted Tenderness throughout periscapular muscles Tenderness throughout paraspinal muscles Mild tenderness to palpation throughout bilateral upper and lower extremities Tenderness noted at left lateral ankle     10/13/21 CLINICAL DATA:  Low back pain. 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 Low back pain radiating to both legs, worsening over the last year.   EXAM: MRI LUMBAR SPINE WITHOUT AND WITH CONTRAST   TECHNIQUE: Multiplanar and multiecho pulse sequences of the lumbar spine were obtained without and with intravenous contrast.   CONTRAST:  10 cc view way.   COMPARISON:  Radiography 02/13/2021   FINDINGS: Segmentation: Transitional lumbosacral anatomy. Transitional vertebra is labeled as S1.   Alignment:  Minimal scoliotic curvature convex to the left.   Vertebrae:  No fracture or focal bone lesion.   Conus medullaris and cauda equina: Conus extends to the L1 level. Conus and cauda equina appear normal.   Paraspinal and other soft tissues: Normal   Disc levels:   Disc levels are normal. No disc degeneration, bulge or herniation. No stenosis of the canal or foramina. No advanced facet arthropathy. Very mild facet joint degeneration and enhancement at L5-S1.   IMPRESSION: 1. Transitional lumbosacral anatomy. Transitional vertebra is labeled as S1. 2. Minimal scoliotic curvature convex to the left. 3. No disc degeneration or herniation. No stenosis of the canal or foramina. 4. Very mild facet degeneration and enhancement at L5-S1. This could relate to low back pain or referred facet syndrome pain.       Assessment & Plan:  Fibromyalgia/polyarthralgia -Currently suspect fibromyalgia is the most likely diagnosis.  She has had a extensive work-up by rheumatology and it does not appear that any abnormalities  noted other than elevated ESR and CRP.  Presentation does not seem typical of polymyalgia rheumatica.  Unless alternative rheumatological condition is identified I think fibromyalgia is the most likely condition resulting in her body wide pain she also has associated depression and sleep disturbance. -Mental health provider changing Cymbalta to different medication previously -Continue Lyrica to 150mg  BID -Continue low impact gradual progressive exercise- limited by asthma -Discussed and provided list of foods to assist with pain control prior visit, discussed trying tart cherry.  She is currently taking turmeric -Continue theracane or similar device -Discussed option of trying shockwave therapy if pain worsens -Patient reports trigger point injections were helpful however further treatments would need to be mindful of cost -Discussed option to try aquatic therapy.  She will try to do home exercises [place] this [place].  She will consider trying formal aqua therapy at a later time -Could consider low-dose naltrexone additional medication as needed -Will order nexwave TENS device   L ankle pain with history of prior ankle surgery -Podiatry consult placed -Advised heel cord stretches for plantar of fasciitis   Depression, denies HI or SI -continue f/u with mental health provider

## 2022-09-24 ENCOUNTER — Ambulatory Visit (INDEPENDENT_AMBULATORY_CARE_PROVIDER_SITE_OTHER): Payer: No Typology Code available for payment source | Admitting: Podiatry

## 2022-09-24 DIAGNOSIS — Z91199 Patient's noncompliance with other medical treatment and regimen due to unspecified reason: Secondary | ICD-10-CM

## 2022-10-06 NOTE — Progress Notes (Signed)
   Complete physical exam  Patient: Emily [name]   DOB: 11/02/2000   22 y.o. Female  MRN: [id number]  Subjective:    No chief complaint on file.   Emily [name] is a 22 y.o. female who presents today for a complete physical exam. She reports consuming a {diet types:17450} diet. {types:19826} She generally feels {DESC; WELL/FAIRLY WELL/POORLY:18703}. She reports sleeping {DESC; WELL/FAIRLY WELL/POORLY:18703}. She {does/does not:200015} have additional problems to discuss today.    Most recent fall risk assessment:    07/10/2021   10:42 AM  Fall Risk   Falls in the past year? 0  Number falls in past yr: 0  Injury with Fall? 0  Risk for fall due to : No Fall Risks  Follow up Falls evaluation completed     Most recent depression screenings:    07/10/2021   10:42 AM 05/31/2020   10:46 AM  PHQ 2/9 Scores  PHQ - 2 Score 0 0  PHQ- 9 Score 5     {VISON DENTAL STD PSA (Optional):27386}  {History (Optional):23778}  Patient Care Team: Jessup, Joy, NP as PCP - General (Nurse Practitioner)   Outpatient Medications Prior to Visit  Medication Sig   fluticasone (FLONASE) 50 MCG/ACT nasal spray Place 2 sprays into both nostrils in the morning and at bedtime. After 7 days, reduce to once daily.   norgestimate-ethinyl estradiol (SPRINTEC 28) 0.25-35 MG-MCG tablet Take 1 tablet by mouth daily.   Nystatin POWD Apply liberally to affected area 2 times per day   spironolactone (ALDACTONE) 100 MG tablet Take 1 tablet (100 mg total) by mouth daily.   No facility-administered medications prior to visit.    ROS        Objective:     There were no vitals taken for this visit. {Vitals History (Optional):23777}  Physical Exam   No results found for any visits on 08/15/21. {Show previous labs (optional):23779}    Assessment & Plan:    Routine Health Maintenance and Physical Exam  Immunization History  Administered Date(s) Administered   DTaP 01/16/1999, 03/14/1999,  05/23/1999, 02/06/2000, 08/22/2003   Hepatitis A 06/18/2007, 06/23/2008   Hepatitis B 11/03/1998, 12/11/1998, 05/23/1999   HiB (PRP-OMP) 01/16/1999, 03/14/1999, 05/23/1999, 02/06/2000   IPV 01/16/1999, 03/14/1999, 11/11/1999, 08/22/2003   Influenza,inj,Quad PF,6+ Mos 09/23/2013   Influenza-Unspecified 12/24/2011   MMR 11/10/2000, 08/22/2003   Meningococcal Polysaccharide 06/23/2011   Pneumococcal Conjugate-13 02/06/2000   Pneumococcal-Unspecified 05/23/1999, 08/06/1999   Tdap 06/23/2011   Varicella 11/11/1999, 06/18/2007    Health Maintenance  Topic Date Due   HIV Screening  Never done   Hepatitis C Screening  Never done   INFLUENZA VACCINE  08/13/2021   PAP-Cervical Cytology Screening  08/15/2021 (Originally 11/02/2019)   PAP SMEAR-Modifier  08/15/2021 (Originally 11/02/2019)   TETANUS/TDAP  08/15/2021 (Originally 06/22/2021)   HPV VACCINES  Discontinued   COVID-19 Vaccine  Discontinued    Discussed health benefits of physical activity, and encouraged her to engage in regular exercise appropriate for her age and condition.  Problem List Items Addressed This Visit   None Visit Diagnoses     Annual physical exam    -  Primary   Cervical cancer screening       Need for Tdap vaccination          No follow-ups on file.     Joy Jessup, NP   

## 2022-10-08 ENCOUNTER — Encounter: Payer: Self-pay | Admitting: Family

## 2022-11-03 ENCOUNTER — Ambulatory Visit: Payer: No Typology Code available for payment source | Admitting: Podiatry

## 2022-11-06 ENCOUNTER — Ambulatory Visit: Payer: No Typology Code available for payment source | Admitting: Podiatry

## 2022-11-07 ENCOUNTER — Encounter: Payer: No Typology Code available for payment source | Admitting: Physical Medicine & Rehabilitation

## 2022-11-12 ENCOUNTER — Ambulatory Visit: Payer: No Typology Code available for payment source | Admitting: Podiatry

## 2022-11-12 ENCOUNTER — Ambulatory Visit (INDEPENDENT_AMBULATORY_CARE_PROVIDER_SITE_OTHER): Payer: No Typology Code available for payment source

## 2022-11-12 ENCOUNTER — Encounter: Payer: Self-pay | Admitting: Podiatry

## 2022-11-12 DIAGNOSIS — M722 Plantar fascial fibromatosis: Secondary | ICD-10-CM | POA: Diagnosis not present

## 2022-11-12 MED ORDER — TRIAMCINOLONE ACETONIDE 10 MG/ML IJ SUSP
10.0000 mg | Freq: Once | INTRAMUSCULAR | Status: AC
  Administered 2022-11-12: 10 mg via INTRA_ARTICULAR

## 2022-11-12 MED ORDER — TRIAMCINOLONE ACETONIDE 10 MG/ML IJ SUSP: 10.0000 mg | Freq: Once | INTRAMUSCULAR | Status: DC

## 2022-11-12 MED ORDER — DICLOFENAC SODIUM 75 MG PO TBEC: 75.0000 mg | DELAYED_RELEASE_TABLET | Freq: Two times a day (BID) | ORAL | 2 refills | Status: DC

## 2022-11-12 NOTE — Patient Instructions (Signed)

## 2022-11-13 NOTE — Progress Notes (Signed)
Subjective:   Patient ID: Emily [name], female   DOB: 22 y.o.   MRN: [id number]   HPI Patient presents with caregivers with a lot of pain in the heels of both feet and states that she has taken medication and states that it has been going on for over a year and is worsened over the last few months.   ROS      Objective:  Physical Exam  Neurovascular status intact exquisite discomfort in the medial fascial band of both heels history of ankle surgery right by Dr. Logan Bores with discomfort upon palpation     Assessment:  Acute plantar fasciitis bilateral with pain     Plan:  H&P x-rays taken and went ahead did sterile prep and injected the plantar fascia both feet at insertion 3 mg Kenalog 5 mg Xylocaine and instructed on support therapy.  Dispensed fascial braces properly fitted into both arches to lift the arches up and we may have to make her orthotics and we will see response when she returns in several weeks

## 2022-11-20 ENCOUNTER — Encounter: Payer: Self-pay | Admitting: Physical Medicine & Rehabilitation

## 2022-11-20 ENCOUNTER — Encounter
Payer: No Typology Code available for payment source | Attending: Physical Medicine & Rehabilitation | Admitting: Physical Medicine & Rehabilitation

## 2022-11-20 VITALS — BP 102/72 | HR 87 | Ht 64.0 in | Wt 288.6 lb

## 2022-11-20 DIAGNOSIS — M722 Plantar fascial fibromatosis: Secondary | ICD-10-CM | POA: Diagnosis not present

## 2022-11-20 DIAGNOSIS — M797 Fibromyalgia: Secondary | ICD-10-CM | POA: Insufficient documentation

## 2022-11-20 DIAGNOSIS — F329 Major depressive disorder, single episode, unspecified: Secondary | ICD-10-CM | POA: Diagnosis not present

## 2022-11-20 NOTE — Progress Notes (Signed)
Subjective:    Patient ID: Emily [name], female    DOB: [birth year], 22 y.o.   MRN: [id number]  HPI HPI 11/25/2021  Emily [name] is a 22 y.o. year old female  who  has a past medical history of ADHD (attention deficit hyperactivity disorder), Anorexia, Anxiety, Asthma, B12 deficiency, Bulimia, Depression, Morbid obesity (HCC), and Treatment-resistant depression.   They are presenting to PM&R clinic as a new patient for pain management evaluation.  Emily [name] has pain that is worse in her back that is stabbing aching in quality.  She has had this pain for about 8 years.  Pain interferes with her sleep.  While her back is the worst source of her pain she has pain throughout her entire body.  Gentle stretching will improve her pain.   Pain is worsened by sitting.  Pain is also increased after she stands for several minutes.  Her activity is decreased due to the pain.   She reports she would like to avoid narcotic pain medications if possible.  She reports that she has had family members who have been addicted to these types of medications.   She has been followed by rheumatology due to ESR and CRP elevations however other tests have been negative.  It does not appear that she has yet been diagnosed with any specific rheumatological condition.  She has a lot of fatigue and has insomnia with significant difficulty falling and staying asleep.  She will often have paresthesias in her arms and legs.  She has a history of depression and is on Cymbalta.  She also had TMS in September with benefit to her mood.  No SI or HI.  No bowel or bladder changes although she has chronic interstitial cystitis.     Medications tried: -Duloxetine 60mg  BID - currently using for mood -Flexeril- helped at first, not much now -Naproxone-helped at first, not much now -Tylenol- no benefit -Ibuprofen- no benefit -Oxycodone for foot surgery helped her foot pain at the time -Prednisione used in the the past  with limited benefit   Other treatments: Physical therapy did not provide significant benefit Lyrica helping Weaning off duloxetine to prestiq     Interval History 01/17/22 Medicine while in house is here for follow-up regarding her chronic pain.  She reports needing mild improvement in her overall pain since starting Lyrica.  She has not had any significant side effects with the medication.  Her mental health provider is planning to change her from duloxetine to a different medication.  She has not tried a TENS unit previously.  Interval History 03/20/22 Emily [name] is here for her chronic pain follow-up.  She reports the pain in her hips and lower back is doing better with Lyrica.  She has less soreness in her arms and legs.  She continues to have occasional shooting pain down the legs.  She denies any side effects with Lyrica.  She continues to have pain around her neck and traps.  Aleve does provide benefit to her pain.  She has been attempting to work on home exercise with walking, she thinks this is beneficial.  Interval History 03/20/22 Patient is here for trigger point injection to her trapezius muscles where she was noted to have several trigger points.  Patient reports Lyrica continues to improve her pain and not causing any significant side effects.  She has tried Thera cane however she finds it is too painful.  We discussed   Interval history 08/08/2022 Emily [name] is  here for follow-up regarding her chronic pain.  She feels like pain in her upper back and trapezius muscles is better since having trigger point injections completed.  She did note that injections were more expensive than she expected.  She reports that Lyrica is helping her pain in her lower back and hips.  Overall pain is much more tolerable than it was several months ago.  Patient continues to have some pain in her feet, reports history of plantar fasciitis and history of left ankle surgery.   Interval History  11/20/22 Patient reports that her leg and lower back pain is overall doing better.  She said she was seen by podiatry who did injections to both of her feet, started braces and advised better shoes.  She reports that her feet pain is much improved allowing her to walk better.  As she is walking better than pain in her lower extremities has been improving.  She continues to have pain primarily in her periscapular muscles.  She has a burning sensation in this location.  Pain is improved with Lyrica however she continues to be uncomfortable.  She reports she has a lot of stressors right now that are sometimes negatively impacting her mood.  Pain Inventory Average Pain 6 Pain Right Now 4 My pain is burning, dull, and aching  In the last 24 hours, has pain interfered with the following? General activity 4 Relation with others 4 Enjoyment of life 4 What TIME of day is your pain at its worst? morning , daytime, evening, and night Sleep (in general) Poor  Pain is worse with: sitting, inactivity, standing, and some activites Pain improves with: therapy/exercise and injections Relief from Meds: 5  Family History  Problem Relation Age of Onset   Anxiety disorder Mother    Drug abuse Father    Hyperlipidemia Maternal Grandmother    Hypertension Maternal Grandmother    Hyperlipidemia Maternal Grandfather    Hypertension Maternal Grandfather    Colon cancer Neg Hx    Pancreatic cancer Neg Hx    Esophageal cancer Neg Hx    Social History   Socioeconomic History   Marital status: Single    Spouse name: Not on file   Number of children: Not on file   Years of education: Not on file   Highest education level: Not on file  Occupational History   Not on file  Tobacco Use   Smoking status: Never    Passive exposure: Yes   Smokeless tobacco: Never   Tobacco comments:    mother smokes outside home  Vaping Use   Vaping status: Never Used  Substance and Sexual Activity   Alcohol use: Yes     Alcohol/week: 0.0 standard drinks of alcohol    Comment: occasional   Drug use: No   Sexual activity: Never    Partners: Male    Comment: Female partners  Other Topics Concern   Not on file  Social History Narrative   Not on file   Social Determinants of Health   Financial Resource Strain: Not on file  Food Insecurity: Not on file  Transportation Needs: Not on file  Physical Activity: Not on file  Stress: Not on file  Social Connections: Unknown (07/30/2022)   Received from [employer]   Social Network    Social Network: Not on file   Past Surgical History:  Procedure Laterality Date   ADENOIDECTOMY     BLADDER REPAIR     LIGAMENT REPAIR  TONSILLECTOMY     Past Surgical History:  Procedure Laterality Date   ADENOIDECTOMY     BLADDER REPAIR     LIGAMENT REPAIR     TONSILLECTOMY     Past Medical History:  Diagnosis Date   ADHD (attention deficit hyperactivity disorder)    Anorexia    Anxiety    Asthma    B12 deficiency    Bulimia    Depression    Morbid obesity (HCC)    Treatment-resistant depression    BP 102/72   Pulse 87   Ht 5\' 4"  (1.626 m)   Wt 288 lb 9.6 oz (130.9 kg)   SpO2 96%   BMI 49.54 kg/m   Opioid Risk Score:   Fall Risk Score:  `1  Depression screen PHQ 2/9     11/20/2022    1:50 PM 08/08/2022    3:01 PM 06/03/2022    1:13 PM 04/17/2022    3:14 PM 03/20/2022    3:22 PM 01/17/2022    3:03 PM 11/25/2021    2:51 PM  Depression screen PHQ 2/9  Decreased Interest 2 0 0 1 2 0 1  Down, Depressed, Hopeless 2 0 0 1 2 0 1  PHQ - 2 Score 4 0 0 2 4 0 2  Altered sleeping       2  Tired, decreased energy       2  Change in appetite       2  Feeling bad or failure about yourself        0  Trouble concentrating       1  Moving slowly or fidgety/restless       0  Suicidal thoughts       0  PHQ-9 Score       9  Difficult doing work/chores       Somewhat difficult    Review of Systems  Constitutional: Negative.   HENT: Negative.    Eyes:  Negative.   Respiratory: Negative.    Cardiovascular: Negative.   Gastrointestinal: Negative.   Endocrine: Negative.   Genitourinary: Negative.   Musculoskeletal:  Positive for back pain and neck pain.       Shoulder, hips, b/l legs  Skin: Negative.   Allergic/Immunologic: Negative.   Neurological: Negative.   Hematological: Negative.   Psychiatric/Behavioral:  Positive for dysphoric mood.   All other systems reviewed and are negative.      Objective:   Physical Exam  Gen: no distress, normal appearing HEENT: oral mucosa pink and moist, NCAT Abd: soft, non-distended Ext: no edema Psych: pleasant, normal affect Skin: intact Neuro: Alert and awake, follows commands, sensation intact light touch in all 4 extremities, strength No focal motor deficits, cranial nerves II through XII grossly intact No ataxia noted Musculoskeletal: Slump test negative no joint swelling noted Tenderness throughout periscapular muscles bilaterally Moderate tenderness C-spine paraspinal muscles, mild tenderness L-spine paraspinal muscles + tenderness to palpation throughout bilateral upper , minimal tenderness noted in bilateral lower extremities Tenderness at her bilateral feet and ankles is improved today     10/13/21 CLINICAL DATA:  Low back pain. 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 Low back pain radiating to both legs, worsening over the last year.   EXAM: MRI LUMBAR SPINE WITHOUT AND WITH CONTRAST   TECHNIQUE: Multiplanar and multiecho pulse sequences of the lumbar spine were obtained without and with intravenous contrast.   CONTRAST:  10 cc view way.   COMPARISON:  Radiography 02/13/2021   FINDINGS: Segmentation: Transitional lumbosacral  anatomy. Transitional vertebra is labeled as S1.   Alignment:  Minimal scoliotic curvature convex to the left.   Vertebrae:  No fracture or focal bone lesion.   Conus medullaris and cauda equina: Conus extends to the L1 level. Conus and cauda equina  appear normal.   Paraspinal and other soft tissues: Normal   Disc levels:   Disc levels are normal. No disc degeneration, bulge or herniation. No stenosis of the canal or foramina. No advanced facet arthropathy. Very mild facet joint degeneration and enhancement at L5-S1.   IMPRESSION: 1. Transitional lumbosacral anatomy. Transitional vertebra is labeled as S1. 2. Minimal scoliotic curvature convex to the left. 3. No disc degeneration or herniation. No stenosis of the canal or foramina. 4. Very mild facet degeneration and enhancement at L5-S1. This could relate to low back pain or referred facet syndrome pain.       Assessment & Plan:  Fibromyalgia/polyarthralgia -Currently suspect fibromyalgia is the most likely diagnosis.  She has had a extensive work-up by rheumatology and it does not appear that any abnormalities noted other than elevated ESR and CRP.  Presentation does not seem typical of polymyalgia rheumatica.  Unless alternative rheumatological condition is identified I think fibromyalgia is the most likely condition resulting in her body wide pain she also has associated depression and sleep disturbance. -Mental health provider changing Cymbalta to different medication previously -Continue Lyrica to 150mg  BID -Continue low impact gradual progressive exercise- limited by asthma -Discussed foods that may be helpful for pain prior visit -Continue theracane  -Could consider shockwave therapy if pain worsens, however could be cost limited -Patient reports trigger point injections were helpful however her cost limited -Discussed option to try aquatic therapy.  Consult placed, PT could also work on dry needling as this has helped her in the past -Could consider low-dose naltrexone additional medication as needed-patient will think about this -Advise TENS unit, next wave was expensive for her however she could purchase device over-the-counter  L ankle pain with history of prior  ankle surgery -Podiatry consult placed -Advised heel cord stretches for plantar of fasciitis-plantar fasciitis is improved with podiatry treatment   Depression, denies HI or SI -continue f/u with mental health provider

## 2022-11-28 ENCOUNTER — Other Ambulatory Visit: Payer: Self-pay | Admitting: Obstetrics and Gynecology

## 2022-11-28 DIAGNOSIS — N946 Dysmenorrhea, unspecified: Secondary | ICD-10-CM

## 2022-12-01 NOTE — Telephone Encounter (Signed)
Patient should be with a new PCP/OBGYN now, previous script was to get her through until she established care with new PCP or OBGYN

## 2022-12-03 ENCOUNTER — Ambulatory Visit (INDEPENDENT_AMBULATORY_CARE_PROVIDER_SITE_OTHER): Payer: No Typology Code available for payment source

## 2022-12-03 ENCOUNTER — Encounter: Payer: Self-pay | Admitting: Podiatry

## 2022-12-03 ENCOUNTER — Other Ambulatory Visit: Payer: Self-pay | Admitting: Physical Medicine & Rehabilitation

## 2022-12-03 ENCOUNTER — Ambulatory Visit: Payer: No Typology Code available for payment source | Admitting: Podiatry

## 2022-12-03 DIAGNOSIS — M722 Plantar fascial fibromatosis: Secondary | ICD-10-CM | POA: Diagnosis not present

## 2022-12-04 NOTE — Progress Notes (Signed)
Subjective:   Patient ID: Emily [name], female   DOB: 22 y.o.   MRN: [id number]   HPI Patient states that she only had a week or 2 of relief and she has had the pain back again and this is now been going on for around 2 years.  States that she is not able to be active and it is very frustrating and currently right hurts more than left   ROS      Objective:  Physical Exam  Neurovascular status intact with exquisite discomfort noted medial fascial band right and left at the insertional point of the tendon calcaneus with fluid buildup around the insertional point     Assessment:  Acute plantar fasciitis right with inflammation fluid     Plan:  H&P reviewed and I recommended endoscopic release I explained procedure risk to patient and to family and I allowed them to read and then signed consent form after extensive review.  They understand there is no guarantee as far as success of surgery all possible complications only 1 foot will be done at the time and I also dispensed air fracture walker today with instructions on usage and fitted properly to her lower leg and I wanted to get used to it prior to procedure.  Patient scheduled for outpatient surgery

## 2022-12-08 ENCOUNTER — Telehealth: Payer: Self-pay | Admitting: Podiatry

## 2022-12-08 NOTE — Telephone Encounter (Signed)
DOS-12/16/2022  EPF RIGHT- [id number]  [employer] EFFECTIVE DATE- 03/13/2021  DEDUCTIBLE- $4000.00 WITH REMAINING $3,837.94  OOP- $7000.00 WITH REMAINING $5,602.71  COINSURANCE- 40%  PER THE [employer] AUTOMATED SYSTEM, PRIOR AUTH IS NOT REQUIRED FOR CPT CODE 09811.  CALL REFERENCE NUMBER: [phone]

## 2022-12-22 ENCOUNTER — Encounter: Payer: No Typology Code available for payment source | Admitting: Podiatry

## 2022-12-22 MED ORDER — HYDROCODONE-ACETAMINOPHEN 10-325 MG PO TABS: 1.0000 | ORAL_TABLET | Freq: Three times a day (TID) | ORAL | 0 refills | Status: AC | PRN

## 2022-12-22 NOTE — Addendum Note (Signed)
Addended by: Lenn Sink on: 12/22/2022 05:03 PM   Modules accepted: Orders

## 2022-12-23 DIAGNOSIS — M722 Plantar fascial fibromatosis: Secondary | ICD-10-CM | POA: Diagnosis not present

## 2022-12-29 ENCOUNTER — Ambulatory Visit (INDEPENDENT_AMBULATORY_CARE_PROVIDER_SITE_OTHER): Payer: No Typology Code available for payment source | Admitting: Podiatry

## 2022-12-29 ENCOUNTER — Ambulatory Visit (INDEPENDENT_AMBULATORY_CARE_PROVIDER_SITE_OTHER): Payer: No Typology Code available for payment source

## 2022-12-29 ENCOUNTER — Encounter: Payer: Self-pay | Admitting: Podiatry

## 2022-12-29 DIAGNOSIS — M722 Plantar fascial fibromatosis: Secondary | ICD-10-CM | POA: Diagnosis not present

## 2022-12-31 NOTE — Progress Notes (Signed)
Subjective:   Patient ID: Emily [name], female   DOB: 22 y.o.   MRN: [id number]   HPI Patient states doing well surgically states that she is having minimal discomfort and knows that 1 point left will probably have to be fixed   ROS      Objective:  Physical Exam  Neurovascular status intact does have moderate pain and want a watch the arch height but overall doing well stitches intact     Assessment:  Patient is doing well with negative Denna Haggard' sign also noted with moderate discomfort in the arch     Plan:  H&P x-ray reviewed I went ahead today and I reapplied sterile dressing instructed on continued boot usage did dispense surgical shoe for driving reappoint 2 weeks suture removal  X-rays indicate there does not appear to be current movement of the arch but appears to be stable

## 2023-01-08 ENCOUNTER — Encounter: Payer: No Typology Code available for payment source | Admitting: Podiatry

## 2023-01-10 ENCOUNTER — Other Ambulatory Visit: Payer: Self-pay | Admitting: Family

## 2023-01-10 DIAGNOSIS — K219 Gastro-esophageal reflux disease without esophagitis: Secondary | ICD-10-CM

## 2023-01-12 ENCOUNTER — Encounter: Payer: Self-pay | Admitting: Podiatry

## 2023-01-12 ENCOUNTER — Ambulatory Visit (INDEPENDENT_AMBULATORY_CARE_PROVIDER_SITE_OTHER): Payer: No Typology Code available for payment source | Admitting: Podiatry

## 2023-01-12 DIAGNOSIS — M722 Plantar fascial fibromatosis: Secondary | ICD-10-CM

## 2023-01-12 NOTE — Progress Notes (Signed)
Subjective:   Patient ID: Emily [name], female   DOB: 22 y.o.   MRN: [id number]   HPI Patient states overall doing pretty well but states that she cannot wear the boot all the time due to the heaviness of it and is wondering what else we can do as she needs to keep stretching   ROS      Objective:  Physical Exam  Ocular status intact inflammation of the mid arch area right with negative Denna Haggard' sign noted and stitches intact     Assessment:  Overall doing well with endoscopic release fascia still has some discomfort in the arch normal     Plan:  H&P stitches removed wound edges coapted well bandages applied boot can be worn when walking but gradual return to soft shoe gear and I dispensed night splint but I want her to wear to stretch the foot out during the gait process and when sitting.  Patient will be seen back to recheck 4 weeks discussed the left foot and at that time we will most likely schedule surgery on the left heel

## 2023-01-27 ENCOUNTER — Other Ambulatory Visit: Payer: Self-pay | Admitting: Allergy & Immunology

## 2023-02-01 ENCOUNTER — Other Ambulatory Visit: Payer: Self-pay | Admitting: Podiatry

## 2023-02-09 ENCOUNTER — Ambulatory Visit (INDEPENDENT_AMBULATORY_CARE_PROVIDER_SITE_OTHER): Payer: No Typology Code available for payment source | Admitting: Podiatry

## 2023-02-09 ENCOUNTER — Ambulatory Visit (INDEPENDENT_AMBULATORY_CARE_PROVIDER_SITE_OTHER): Payer: No Typology Code available for payment source

## 2023-02-09 DIAGNOSIS — M722 Plantar fascial fibromatosis: Secondary | ICD-10-CM | POA: Diagnosis not present

## 2023-02-11 NOTE — Progress Notes (Signed)
Subjective:   Patient ID: Emily [name], female   DOB: 23 y.o.   MRN: [id number]   HPI Patient states overall doing well with minimal discomfort and knows that she needs to get her left one done but cannot do yet trying to figure out her timing   ROS      Objective:  Physical Exam  Neurovascular status intact negative Denna Haggard' sign noted right heel doing well with minimal inflammation and arch height adequate.  Patient left has quite a bit of pain in the medial band and knows she needs surgery just cannot do it at this current time but is hoping to be able to do this in March     Assessment:  Doing well endoscopic release right with plantar fasciitis left     Plan:  H&P reviewed and for the left I do think that surgical intervention will be necessary and I did discuss surgery recovery with her for that particular foot.  She wants to get this done it is good to figure out her schedule to be able to take care of this with all questions answered today concerning that and for the right can return to shoe gear as tolerated

## 2023-02-19 ENCOUNTER — Encounter: Payer: No Typology Code available for payment source | Admitting: Physical Medicine & Rehabilitation

## 2023-02-26 ENCOUNTER — Other Ambulatory Visit: Payer: Self-pay

## 2023-02-26 ENCOUNTER — Ambulatory Visit: Payer: No Typology Code available for payment source | Admitting: Allergy & Immunology

## 2023-02-26 ENCOUNTER — Encounter: Payer: Self-pay | Admitting: Allergy & Immunology

## 2023-02-26 VITALS — BP 90/60 | HR 86 | Temp 98.2°F | Resp 16

## 2023-02-26 DIAGNOSIS — J3089 Other allergic rhinitis: Secondary | ICD-10-CM

## 2023-02-26 DIAGNOSIS — J4540 Moderate persistent asthma, uncomplicated: Secondary | ICD-10-CM

## 2023-02-26 DIAGNOSIS — J4550 Severe persistent asthma, uncomplicated: Secondary | ICD-10-CM

## 2023-02-26 DIAGNOSIS — J310 Chronic rhinitis: Secondary | ICD-10-CM

## 2023-02-26 MED ORDER — ALBUTEROL SULFATE HFA 108 (90 BASE) MCG/ACT IN AERS: 2.0000 | INHALATION_SPRAY | RESPIRATORY_TRACT | 1 refills | Status: AC | PRN

## 2023-02-26 MED ORDER — BREZTRI AEROSPHERE 160-9-4.8 MCG/ACT IN AERO: 2.0000 | INHALATION_SPRAY | Freq: Two times a day (BID) | RESPIRATORY_TRACT | 5 refills | Status: DC

## 2023-02-26 MED ORDER — TEZEPELUMAB-EKKO 210 MG/1.91ML ~~LOC~~ SOSY
210.0000 mg | PREFILLED_SYRINGE | Freq: Once | SUBCUTANEOUS | Status: AC
  Administered 2023-02-26: 210 mg via SUBCUTANEOUS

## 2023-02-26 NOTE — Progress Notes (Unsigned)
FOLLOW UP  Date of Service/Encounter:  02/26/23   Assessment:   Moderate persistent asthma, uncomplicated    Chronic non-allergic rhinitis   Keratosis pilaris   Pustular lesions - just popped up over the last few day    Chronic pain - followed by Dr. Dimple Casey   Depression - s/p multiple medication changes and improved with TMS    Plan/Recommendations:   Assessment and Plan              There are no Patient Instructions on file for this visit.   Subjective:   Emily [name] is a 23 y.o. female presenting today for follow up of  Chief Complaint  Patient presents with   Follow-up   Asthma    Emily [name] has a history of the following: Patient Active Problem List   Diagnosis Date Noted   Recurrent major depression resistant to treatment (HCC) 04/17/2021   Essential hypertension 04/17/2021   Acne rosacea 01/17/2021   Elevated C-reactive protein (CRP) 12/11/2020   Elevated sed rate 12/11/2020   Arthralgia 11/27/2020   Social anxiety disorder 08/02/2020   B12 deficiency 06/05/2020   Dry eye 05/25/2020   Vitamin D deficiency 05/25/2020   Epistaxis, recurrent 01/05/2020   Ankle weakness 12/01/2019   Mixed hyperlipidemia 12/01/2019   Paresthesia of skin 05/27/2019   BMI 50.0-59.9, adult (HCC) 05/12/2019   Chronic fatigue 05/12/2019   Chronic left-sided low back pain without sciatica 12/02/2017   Interstitial cystitis (chronic) with hematuria 11/26/2017   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  Discussed the use of AI scribe software for clinical note transcription with the patient and/or guardian, who  gave verbal consent to proceed.  Emily [name] is a 23 y.o. female presenting for a follow up visit.  She was last seen in April 2024.  At that time, we will continue with Breztri 2 puffs twice daily.  We did have plans to start Tezspire, but she was about to lose her insurance so we decided to hold off.  For her rhinitis, we continue with Nasacort and Xyzal 5 mg daily. For her gustatory rhinitis, we started her on Atrovent nasal spray.   Since the last visit,   Discussed the use of AI scribe software for clinical note transcription with the patient, who gave verbal consent to proceed.  History of Present Illness            Asthma/Respiratory Symptom History: ***  [name]'s asthma has been well controlled. She has not required rescue medication, experienced nocturnal awakenings due to lower respiratory symptoms, nor have activities of daily living been limited. She has required no Emergency Department or Urgent Care visits for her asthma. She has required zero courses of systemic steroids for asthma exacerbations since the last visit. ACT score today is 18, indicating subpar asthma symptom control. She has been using her rescue inhaler a bit more.    Allergic Rhinitis Symptom History: She was using Atrovent and she no longer needs it. She has not been sick with a sinus infection at this point.  Food Allergy Symptom History: ***  Skin Symptom History: ***  GERD Symptom History: ***  Infection Symptom History: ***  Otherwise, there  have been no changes to her past medical history, surgical history, family history, or social history.    Review of systems otherwise negative other than that mentioned in the HPI.    Objective:   There were no vitals taken for this visit. There is no height or weight on file to calculate BMI.    Physical Exam   Diagnostic studies:    Spirometry: results normal (FEV1: 3.34/108%, FVC: 3.96/112%, FEV1/FVC: 84%).    Spirometry consistent with normal  pattern. {Blank single:19197::"Albuterol/Atrovent nebulizer","Xopenex/Atrovent nebulizer","Albuterol nebulizer","Albuterol four puffs via MDI","Xopenex four puffs via MDI"} treatment given in clinic with {Blank single:19197::"significant improvement in FEV1 per ATS criteria","significant improvement in FVC per ATS criteria","significant improvement in FEV1 and FVC per ATS criteria","improvement in FEV1, but not significant per ATS criteria","improvement in FVC, but not significant per ATS criteria","improvement in FEV1 and FVC, but not significant per ATS criteria","no improvement"}.  Allergy Studies: {Blank single:19197::"none","deferred due to recent antihistamine use","deferred due to insurance stipulations that require a separate visit for testing","labs sent instead"," "}    {Blank single:19197::"Allergy testing results were read and interpreted by myself, documented by clinical staff."," "}      [name], MD  [facility]

## 2023-02-26 NOTE — Patient Instructions (Signed)
1. Moderate persistent asthma - less well controlled - Lung testing looks excellent today. - Sample of Tezspire given today (and then we will get this approved when you give [place] your new insurance card next month).  - Since your insurance will end at the end of this month, I will hold off on trying to get Tezspire approved. When you get your new insurance card, come by our office so we can get a copy of it and try to get Tezspire approved.  - Daily controller medication(s): Breztri two puffs twice daily with spacer to help prevent cough and wheeze. Rinse mouth - Prior to physical activity: albuterol 2 puffs 10-15 minutes before physical activity. - Rescue medications: albuterol 4 puffs every 4-6 hours as needed - Asthma control goals:  * Full participation in all desired activities (may need albuterol before activity) * Albuterol use two time or less a week on average (not counting use with activity) * Cough interfering with sleep two time or less a month * Oral steroids no more than once a year * No hospitalizations  2. Perennial allergic rhinitis (dust mites) - well controlled  - Continue  Nasacort one spray per nostril twice daily for stuffy nose AND continue Xyzal 5mg  daily as needed for runny nose - You can take an extra dose of antihistamines if needed on bad days.   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  4. Return in about 6 months (around 08/26/2023). You can have the follow up appointment with Dr. Dellis Anes or a Nurse Practicioner (our Nurse Practitioners are excellent and always have Physician oversight!).   Check out the option of getting paid to take care of your grandparents (through Medicaid and Medicare):   [email or web address]   Please inform us of any Emergency Department visits, hospitalizations, or changes in symptoms. Call us before  going to the ED for breathing or allergy symptoms since we might be able to fit you in for a sick visit. Feel free to contact us anytime with any questions, problems, or concerns.  It was a pleasure to see you again today!  Websites that have reliable patient information: 1. American Academy of Asthma, Allergy, and Immunology: www.aaaai.org 2. Food Allergy Research and Education (FARE): foodallergy.org 3. Mothers of Asthmatics: http://www.asthmacommunitynetwork.org 4. American College of Allergy, Asthma, and Immunology: www.acaai.org      "Like" [place] on Facebook and Instagram for our latest updates!      A healthy democracy works best when [employer] participate! Make sure you are registered to vote! If you have moved or changed any of your contact information, you will need to get this updated before voting! Scan the QR codes below to learn more!

## 2023-02-26 NOTE — Progress Notes (Unsigned)
Immunotherapy   Patient Details  Name: Emily [name] MRN: [id number] Date of Birth: [birth year]  02/26/2023  Emily [name] started injections for  Tezspire  Frequency: Every 28 days Epi-Pen: Not Required Consent signed and patient instructions given. Patient started Tezspire today and received 1.23mL Sample in the [facility]. Patient waited 15 minutes in office and did not experience any issues.   Emily [name] 02/26/2023, 2:40 PM

## 2023-02-27 ENCOUNTER — Encounter
Payer: No Typology Code available for payment source | Attending: Physical Medicine & Rehabilitation | Admitting: Physical Medicine & Rehabilitation

## 2023-02-27 ENCOUNTER — Encounter: Payer: Self-pay | Admitting: Physical Medicine & Rehabilitation

## 2023-02-27 ENCOUNTER — Encounter: Payer: Self-pay | Admitting: Allergy & Immunology

## 2023-02-27 VITALS — BP 138/81 | HR 108 | Ht 64.0 in | Wt 288.0 lb

## 2023-02-27 DIAGNOSIS — M797 Fibromyalgia: Secondary | ICD-10-CM | POA: Diagnosis not present

## 2023-02-27 DIAGNOSIS — M722 Plantar fascial fibromatosis: Secondary | ICD-10-CM | POA: Insufficient documentation

## 2023-02-27 DIAGNOSIS — F329 Major depressive disorder, single episode, unspecified: Secondary | ICD-10-CM | POA: Insufficient documentation

## 2023-02-27 MED ORDER — PREGABALIN 150 MG PO CAPS: 150.0000 mg | ORAL_CAPSULE | Freq: Two times a day (BID) | ORAL | 5 refills | Status: DC

## 2023-02-27 NOTE — Progress Notes (Signed)
Subjective:    Emily [name] ID: Emily [name], female    DOB: Jan 04, 2001, 23 y.o.   MRN: [id number]  HPI  HPI 11/25/2021  Emily [name] is a 23 y.o. year old female  who  has a past medical history of ADHD (attention deficit hyperactivity disorder), Anorexia, Anxiety, Asthma, B12 deficiency, Bulimia, Depression, Morbid obesity (HCC), and Treatment-resistant depression.   They are presenting to PM&R clinic as a new Emily [name] for pain management evaluation.  Emily [name] has pain that is worse in her back that is stabbing aching in quality.  She has had this pain for about 8 years.  Pain interferes with her sleep.  While her back is the worst source of her pain she has pain throughout her entire body.  Gentle stretching will improve her pain.   Pain is worsened by sitting.  Pain is also increased after she stands for several minutes.  Her activity is decreased due to the pain.   She reports she would like to avoid narcotic pain medications if possible.  She reports that she has had family members who have been addicted to these types of medications.   She has been followed by rheumatology due to ESR and CRP elevations however other tests have been negative.  It does not appear that she has yet been diagnosed with any specific rheumatological condition.  She has a lot of fatigue and has insomnia with significant difficulty falling and staying asleep.  She will often have paresthesias in her arms and legs.  She has a history of depression and is on Cymbalta.  She also had TMS in September with benefit to her mood.  No SI or HI.  No bowel or bladder changes although she has chronic interstitial cystitis.     Medications tried: -Duloxetine 60mg  BID - currently using for mood -Flexeril- helped at first, not much now -Naproxone-helped at first, not much now -Tylenol- no benefit -Ibuprofen- no benefit -Oxycodone for foot surgery helped her foot pain at the time -Prednisione used in the the  past with limited benefit   Other treatments: Physical therapy did not provide significant benefit Lyrica helping Weaning off duloxetine to prestiq     Interval History 01/17/22 Medicine while in house is here for follow-up regarding her chronic pain.  She reports needing mild improvement in her overall pain since starting Lyrica.  She has not had any significant side effects with the medication.  Her mental health provider is planning to change her from duloxetine to a different medication.  She has not tried a TENS unit previously.   Interval History 03/19/21 Emily [name] is here for her chronic pain follow-up.  She reports the pain in her hips and lower back is doing better with Lyrica.  She has less soreness in her arms and legs.  She continues to have occasional shooting pain down the legs.  She denies any side effects with Lyrica.  She continues to have pain around her neck and traps.  Aleve does provide benefit to her pain.  She has been attempting to work on home exercise with walking, she thinks this is beneficial.   Interval History 03/19/21 Emily [name] is here for trigger point injection to her trapezius muscles where she was noted to have several trigger points.  Emily [name] reports Lyrica continues to improve her pain and not causing any significant side effects.  She has tried Thera cane however she finds it is too painful.  We discussed    Interval history  08/08/2022 Emily [name] is here for follow-up regarding her chronic pain.  She feels like pain in her upper back and trapezius muscles is better since having trigger point injections completed.  She did note that injections were more expensive than she expected.  She reports that Lyrica is helping her pain in her lower back and hips.  Overall pain is much more tolerable than it was several months ago.  Emily [name] continues to have some pain in her feet, reports history of plantar fasciitis and history of left ankle surgery.     Interval  History 11/20/22 Emily [name] reports that her leg and lower back pain is overall doing better.  She said she was seen by podiatry who did injections to both of her feet, started braces and advised better shoes.  She reports that her feet pain is much improved allowing her to walk better.  As she is walking better than pain in her lower extremities has been improving.  She continues to have pain primarily in her periscapular muscles.  She has a burning sensation in this location.  Pain is improved with Lyrica however she continues to be uncomfortable.  She reports she has a lot of stressors right now that are sometimes negatively impacting her mood.   Interval history 02/27/2023 Emily [name] is here for follow-up regarding her chronic body wide pain.  Emily [name] reports that Lyrica is helping control her lower back pain and overall aches and pains in her arms and legs.  If she stops taking Lyrica she notices a significant increase in her overall pain.  She continues to have a lot of tenderness and pain in her periscapular muscles bilaterally.  She has been having some intermittent bilateral knee pain, she feels may be associated with the weather.  She recently had plantar fasciitis surgery on her right foot.  She reports that right foot pain has improved after the surgery.  She is considering having a similar surgery on her left foot.  She was scheduled to try aquatic therapy, delayed due to her foot surgery.  She reports her health insurance may change to a different company shortly.   Pain Inventory Average Pain 4 Pain Right Now 7 My pain is constant, burning, stabbing, and aching  In the last 24 hours, has pain interfered with the following? General activity 0 Relation with others 0 Enjoyment of life 0 What TIME of day is your pain at its worst? varies Sleep (in general) Poor  Pain is worse with: walking, bending, sitting, standing, and some activites Pain improves with: medication Relief from Meds:   naproxen and lyrica helps with aching and soreness   Family History  Problem Relation Age of Onset   Anxiety disorder Mother    Drug abuse Father    Hyperlipidemia Maternal Grandmother    Hypertension Maternal Grandmother    Hyperlipidemia Maternal Grandfather    Hypertension Maternal Grandfather    Colon cancer Neg Hx    Pancreatic cancer Neg Hx    Esophageal cancer Neg Hx    Social History   Socioeconomic History   Marital status: Single    Spouse name: Not on file   Number of children: Not on file   Years of education: Not on file   Highest education level: Not on file  Occupational History   Not on file  Tobacco Use   Smoking status: Never    Passive exposure: Yes   Smokeless tobacco: Never   Tobacco comments:    mother smokes outside home  Vaping Use   Vaping status: Never Used  Substance and Sexual Activity   Alcohol use: Yes    Alcohol/week: 0.0 standard drinks of alcohol    Comment: occasional   Drug use: No   Sexual activity: Never    Partners: Male    Comment: Female partners  Other Topics Concern   Not on file  Social History Narrative   Not on file   Social Drivers of Health   Financial Resource Strain: Low Risk  (02/01/2023)   Received from [facility]   Overall Financial Resource Strain (CARDIA)    Difficulty of Paying Living Expenses: Not hard at all  Food Insecurity: No Food Insecurity (02/01/2023)   Received from [facility]   Hunger Vital Sign    Worried About Running Out of Food in the Last Year: Never true    Ran Out of Food in the Last Year: Never true  Transportation Needs: No Transportation Needs (02/01/2023)   Received from [facility] - Transportation    Lack of Transportation (Medical): No    Lack of Transportation (Non-Medical): No  Physical Activity: Not on file  Stress: Not on file  Social Connections: Unknown (07/30/2022)   [facility] from [facility]   Social Network    Social Network: Not on file   Past  Surgical History:  Procedure Laterality Date   ADENOIDECTOMY     BLADDER REPAIR     LIGAMENT REPAIR     TONSILLECTOMY     Past Surgical History:  Procedure Laterality Date   ADENOIDECTOMY     BLADDER REPAIR     LIGAMENT REPAIR     TONSILLECTOMY     Past Medical History:  Diagnosis Date   ADHD (attention deficit hyperactivity disorder)    Anorexia    Anxiety    Asthma    B12 deficiency    Bulimia    Depression    Morbid obesity (HCC)    Treatment-resistant depression    Ht 5\' 4"  (1.626 m)   Wt 288 lb (130.6 kg)   BMI 49.44 kg/m   Opioid Risk Score:   Fall Risk Score:  `1  Depression screen [facility] 2/9     02/27/2023    3:42 PM 11/20/2022    1:50 PM 08/08/2022    3:01 PM 06/03/2022    1:13 PM 04/17/2022    3:14 PM 03/20/2022    3:22 PM 01/17/2022    3:03 PM  Depression screen PHQ 2/9  Decreased Interest 0 2 0 0 1 2 0  Down, Depressed, Hopeless 0 2 0 0 1 2 0  PHQ - 2 Score 0 4 0 0 2 4 0     Review of Systems  Musculoskeletal:  Positive for back pain.  All other systems reviewed and are negative.     Objective:   Physical Exam   Gen: no distress, normal appearing HEENT: oral mucosa pink and moist, NCAT Abd: soft, non-distended Ext: no edema Psych: pleasant, normal affect Skin: intact Neuro: Alert and awake, follows commands, sensation intact light touch in all 4 extremities, cranial nerves II through XII grossly intact Moving all 4 extremities to gravity and resistance  Musculoskeletal: Slump test negative  Spurling test negative Greatest tenderness noted in the parascapular muscles bilaterally  TTP C-spine paraspinal muscles Minimal TTP L-spine paraspinal muscles Minimal TTP bilateral knees     10/13/21 CLINICAL DATA:  Low back pain. 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 Low back pain radiating to both legs, worsening over the  last year.   EXAM: MRI LUMBAR SPINE WITHOUT AND WITH CONTRAST   TECHNIQUE: Multiplanar and multiecho pulse sequences of the lumbar  spine were obtained without and with intravenous contrast.   CONTRAST:  10 cc view way.   COMPARISON:  Radiography 02/13/2021   FINDINGS: Segmentation: Transitional lumbosacral anatomy. Transitional vertebra is labeled as S1.   Alignment:  Minimal scoliotic curvature convex to the left.   Vertebrae:  No fracture or focal bone lesion.   Conus medullaris and cauda equina: Conus extends to the L1 level. Conus and cauda equina appear normal.   Paraspinal and other soft tissues: Normal   Disc levels:   Disc levels are normal. No disc degeneration, bulge or herniation. No stenosis of the canal or foramina. No advanced facet arthropathy. Very mild facet joint degeneration and enhancement at L5-S1.   IMPRESSION: 1. Transitional lumbosacral anatomy. Transitional vertebra is labeled as S1. 2. Minimal scoliotic curvature convex to the left. 3. No disc degeneration or herniation. No stenosis of the canal or foramina. 4. Very mild facet degeneration and enhancement at L5-S1. This could relate to low back pain or referred facet syndrome pain.            Assessment & Plan:     Fibromyalgia/polyarthralgia -Currently suspect fibromyalgia is the most likely diagnosis.  She has had a extensive work-up by rheumatology and it does not appear that any abnormalities noted other than elevated ESR and CRP.  Presentation does not seem typical of polymyalgia rheumatica.  Unless alternative rheumatological condition is identified I think fibromyalgia is the most likely condition resulting in her body wide pain she also has associated depression and sleep disturbance. -Mental health provider changed Cymbalta to different medication previously -Continue Lyrica to 150mg  BID, reordered -Continue low impact gradual progressive exercise- limited by asthma -Discussed foods that may be helpful for pain prior visit -Continue use of Theracane -Could consider shockwave therapy if pain worsens, however  could be cost limited -Emily [name] reports trigger point injections were helpful however her cost limited -Physical therapy/aquatic therapy.  Consult placed prior visit-she plans to call to reschedule this, PT could also work on dry needling as this has helped her in the past -Could consider low-dose naltrexone additional medication as needed-Emily [name] will think about this -Advise TENS unit, next wave was expensive for her however she could purchase device over-the-counter    Bilateral plantar fasciitis -Podiatry has completed surgery on her lower right foot with improvement, possible surgery for left foot also.   Depression, denies HI or SI -continue f/u with mental health provider

## 2023-03-04 ENCOUNTER — Ambulatory Visit: Payer: No Typology Code available for payment source | Admitting: Obstetrics and Gynecology

## 2023-03-05 ENCOUNTER — Ambulatory Visit: Payer: No Typology Code available for payment source | Admitting: Obstetrics and Gynecology

## 2023-03-19 ENCOUNTER — Other Ambulatory Visit: Payer: Self-pay | Admitting: Obstetrics and Gynecology

## 2023-04-18 ENCOUNTER — Other Ambulatory Visit: Payer: Self-pay | Admitting: Physical Medicine & Rehabilitation

## 2023-05-25 ENCOUNTER — Ambulatory Visit (INDEPENDENT_AMBULATORY_CARE_PROVIDER_SITE_OTHER): Admitting: Podiatry

## 2023-05-25 ENCOUNTER — Encounter: Payer: Self-pay | Admitting: Podiatry

## 2023-05-25 ENCOUNTER — Ambulatory Visit (INDEPENDENT_AMBULATORY_CARE_PROVIDER_SITE_OTHER)

## 2023-05-25 DIAGNOSIS — M779 Enthesopathy, unspecified: Secondary | ICD-10-CM

## 2023-05-25 DIAGNOSIS — M722 Plantar fascial fibromatosis: Secondary | ICD-10-CM

## 2023-05-25 MED ORDER — TRIAMCINOLONE ACETONIDE 10 MG/ML IJ SUSP
10.0000 mg | Freq: Once | INTRAMUSCULAR | Status: AC
  Administered 2023-05-25: 10 mg via INTRA_ARTICULAR

## 2023-05-28 NOTE — Progress Notes (Signed)
 Subjective:   Patient ID: Emily [name], female   DOB: 23 y.o.   MRN: [id number]   HPI Patient states she knows she is getting need surgery on her left heel but trying to find the right time and just needs relief with the right when doing very well after surgery and history of ankle sprains left foot seems to be more due to the plantar pain   ROS      Objective:  Physical Exam  Neuro vascular status intact inflammation fluid around the plantar left heel at the insertional point tendon calcaneus does not appear to have an unstable ankle but did have surgery on the right 1     Assessment:  Acute plantar fasciitis left well-healed right with moderate ankle issues     Plan:  H&P reviewed were going to focus on the heel first I did do sterile prep injected the plantar fascia at insertion 3 mg Kenalog  5 mg Xylocaine  advised on range of motion exercises we will reevaluate and at 1 point is going to require surgery that she is going to schedule  X-rays indicate that there is no signs of diastases injury or instability within the ankle from a structural perspective

## 2023-06-15 DIAGNOSIS — L219 Seborrheic dermatitis, unspecified: Secondary | ICD-10-CM | POA: Insufficient documentation

## 2023-07-16 DIAGNOSIS — J4540 Moderate persistent asthma, uncomplicated: Secondary | ICD-10-CM | POA: Insufficient documentation

## 2023-08-27 ENCOUNTER — Ambulatory Visit: Payer: No Typology Code available for payment source | Admitting: Allergy & Immunology

## 2023-08-28 ENCOUNTER — Encounter: Payer: Self-pay | Admitting: Physical Medicine & Rehabilitation

## 2023-08-28 NOTE — Progress Notes (Deleted)
 Subjective:    Patient ID: Emily [name], female    DOB: [birth year], 23 y.o.   MRN: [id number]  HPI   Pain Inventory Average Pain {NUMBERS; 0-10:5044} Pain Right Now {NUMBERS; 0-10:5044} My pain is {PAIN DESCRIPTION:21022940}  In the last 24 hours, has pain interfered with the following? General activity {NUMBERS; 0-10:5044} Relation with others {NUMBERS; 0-10:5044} Enjoyment of life {NUMBERS; 0-10:5044} What TIME of day is your pain at its worst? {time of day:24191} Sleep (in general) {BHH GOOD/FAIR/POOR:22877}  Pain is worse with: {ACTIVITIES:21022942} Pain improves with: {PAIN IMPROVES TPUY:78977056} Relief from Meds: {NUMBERS; 0-10:5044}  Family History  Problem Relation Age of Onset   Anxiety disorder Mother    Drug abuse Father    Hyperlipidemia Maternal Grandmother    Hypertension Maternal Grandmother    Hyperlipidemia Maternal Grandfather    Hypertension Maternal Grandfather    Colon cancer Neg Hx    Pancreatic cancer Neg Hx    Esophageal cancer Neg Hx    Social History   Socioeconomic History   Marital status: Single    Spouse name: Not on file   Number of children: Not on file   Years of education: Not on file   Highest education level: Not on file  Occupational History   Not on file  Tobacco Use   Smoking status: Never    Passive exposure: Yes   Smokeless tobacco: Never   Tobacco comments:    mother smokes outside home  Vaping Use   Vaping status: Never Used  Substance and Sexual Activity   Alcohol use: Yes    Comment: occasional   Drug use: No   Sexual activity: Never    Partners: Male    Comment: Female partners  Other Topics Concern   Not on file  Social History Narrative   Not on file   Social Drivers of Health   Financial Resource Strain: Low Risk  (02/01/2023)   Received from [facility]   Overall Financial Resource Strain (CARDIA)    Difficulty of Paying Living Expenses: Not hard at all  Food Insecurity: No Food  Insecurity (02/01/2023)   Received from [facility]   Hunger Vital Sign    Worried About Running Out of Food in the Last Year: Never true    Ran Out of Food in the Last Year: Never true  Transportation Needs: No Transportation Needs (02/01/2023)   Received from [facility] - Transportation    Lack of Transportation (Medical): No    Lack of Transportation (Non-Medical): No  Physical Activity: Not on file  Stress: Not on file  Social Connections: Unknown (07/30/2022)   Received from [facility]   Social Network    Social Network: Not on file   Past Surgical History:  Procedure Laterality Date   ADENOIDECTOMY     BLADDER REPAIR     LIGAMENT REPAIR     TONSILLECTOMY     Past Surgical History:  Procedure Laterality Date   ADENOIDECTOMY     BLADDER REPAIR     LIGAMENT REPAIR     TONSILLECTOMY     Past Medical History:  Diagnosis Date   ADHD (attention deficit hyperactivity disorder)    Anorexia    Anxiety    Asthma    B12 deficiency    Bulimia    Depression    Morbid obesity (HCC)    Treatment-resistant depression    There were no vitals taken for this visit.  Opioid Risk Score:  Fall Risk Score:  `1  Depression screen [facility] 2/9     02/27/2023    3:42 PM 11/20/2022    1:50 PM 08/08/2022    3:01 PM 06/03/2022    1:13 PM 04/17/2022    3:14 PM 03/20/2022    3:22 PM 01/17/2022    3:03 PM  Depression screen PHQ 2/9  Decreased Interest 0 2 0 0 1 2 0  Down, Depressed, Hopeless 0 2 0 0 1 2 0  PHQ - 2 Score 0 4 0 0 2 4 0    Review of Systems     Objective:   Physical Exam        Assessment & Plan:

## 2023-09-01 ENCOUNTER — Other Ambulatory Visit: Payer: Self-pay | Admitting: Physical Medicine & Rehabilitation

## 2023-10-06 ENCOUNTER — Ambulatory Visit (INDEPENDENT_AMBULATORY_CARE_PROVIDER_SITE_OTHER): Admitting: Allergy & Immunology

## 2023-10-06 ENCOUNTER — Other Ambulatory Visit: Payer: Self-pay

## 2023-10-06 ENCOUNTER — Encounter: Payer: Self-pay | Admitting: Allergy & Immunology

## 2023-10-06 VITALS — BP 110/80 | HR 90 | Temp 98.2°F | Resp 16 | Ht 64.0 in | Wt 280.8 lb

## 2023-10-06 DIAGNOSIS — J3089 Other allergic rhinitis: Secondary | ICD-10-CM | POA: Diagnosis not present

## 2023-10-06 DIAGNOSIS — N946 Dysmenorrhea, unspecified: Secondary | ICD-10-CM

## 2023-10-06 DIAGNOSIS — J4540 Moderate persistent asthma, uncomplicated: Secondary | ICD-10-CM | POA: Diagnosis not present

## 2023-10-06 DIAGNOSIS — J310 Chronic rhinitis: Secondary | ICD-10-CM

## 2023-10-06 MED ORDER — BREZTRI AEROSPHERE 160-9-4.8 MCG/ACT IN AERO: 2.0000 | INHALATION_SPRAY | Freq: Two times a day (BID) | RESPIRATORY_TRACT | 5 refills | Status: AC

## 2023-10-06 NOTE — Patient Instructions (Addendum)
 1. Moderate persistent asthma - less well controlled - Lung testing looks excellent today despite being off of the Breztri . - We will work on getting Tezspire  approved on your new insurance. - Tammy will reach out to discuss this process at some point.  - Daily controller medication(s): Breztri  two puffs twice daily with spacer to help prevent cough and wheeze - Prior to physical activity: albuterol  2 puffs 10-15 minutes before physical activity. - Rescue medications: albuterol  4 puffs every 4-6 hours as needed - Asthma control goals:  * Full participation in all desired activities (may need albuterol  before activity) * Albuterol  use two time or less a week on average (not counting use with activity) * Cough interfering with sleep two time or less a month * Oral steroids no more than once a year * No hospitalizations  2. Perennial allergic rhinitis (dust mites) - well controlled  - Continue  Nasacort  one spray per nostril twice daily for stuffy nose. - Continue Xyzal 5mg  daily as needed for runny nose - You can take an extra dose of antihistamines if needed on bad days.   3. Gustatory rhinitis - Continue ipratropium bromide  0.03% as needed.  4. Return in about 6 months (around 04/04/2024). You can have the follow up appointment with Dr. Iva or a Nurse Practicioner (our Nurse Practitioners are excellent and always have Physician oversight!).    Check out the option of getting paid to take care of your grandparents (through Medicaid and Medicare):   [email or web address]   Please inform us  of any Emergency Department visits, hospitalizations, or changes in symptoms. Call us  before going to the ED for breathing or allergy symptoms since we might be able to fit you in for a sick visit. Feel free to contact us  anytime with any questions, problems, or concerns.  It was a pleasure  to see you again today!  Websites that have reliable patient information: 1. American Academy of Asthma, Allergy, and Immunology: www.aaaai.org 2. Food Allergy Research and Education (FARE): foodallergy.org 3. Mothers of Asthmatics: http://www.asthmacommunitynetwork.org 4. American College of Allergy, Asthma, and Immunology: www.acaai.org      "Like" us  on Facebook and Instagram for our latest updates!      A healthy democracy works best when [employer] participate! Make sure you are registered to vote! If you have moved or changed any of your contact information, you will need to get this updated before voting! Scan the QR codes below to learn more!

## 2023-10-06 NOTE — Progress Notes (Unsigned)
 FOLLOW UP  Date of Service/Encounter:  10/06/23   Assessment:   Moderate persistent asthma, uncomplicated    Chronic non-allergic rhinitis   Keratosis pilaris   Pustular lesions - just popped up over the last few day    Chronic pain - followed by Emily [name]   Depression - s/p multiple medication changes and improved with TMS  Plan/Recommendations:   Patient Instructions  1. Moderate persistent asthma - less well controlled - Lung testing looks excellent today despite being off of the Breztri . - We will work on getting Tezspire  approved on your new insurance. - Emily [name] will reach out to discuss this process at some point.  - Daily controller medication(s): Breztri  two puffs twice daily with spacer to help prevent cough and wheeze - Prior to physical activity: albuterol  2 puffs 10-15 minutes before physical activity. - Rescue medications: albuterol  4 puffs every 4-6 hours as needed - Asthma control goals:  * Full participation in all desired activities (may need albuterol  before activity) * Albuterol  use two time or less a week on average (not counting use with activity) * Cough interfering with sleep two time or less a month * Oral steroids no more than once a year * No hospitalizations  2. Perennial allergic rhinitis (dust mites) - well controlled  - Continue  Nasacort  one spray per nostril twice daily for stuffy nose. - Continue Xyzal 5mg  daily as needed for runny nose - You can take an extra dose of antihistamines if needed on bad days.   3. Gustatory rhinitis - Continue ipratropium bromide  0.03% as needed.  4. Return in about 6 months (around 04/04/2024). You can have the follow up appointment with Emily [name] or a Nurse Practicioner (our Nurse Practitioners are excellent and always have Physician oversight!).    Check out the option of getting paid to take care of your grandparents (through Medicaid and Medicare):    [email or web address]   Please inform us  of any Emergency Department visits, hospitalizations, or changes in symptoms. Call us  before going to the ED for breathing or allergy symptoms since we might be able to fit you in for a sick visit. Feel free to contact us  anytime with any questions, problems, or concerns.  It was a pleasure to see you again today!  Websites that have reliable patient information: 1. American Academy of Asthma, Allergy, and Immunology: www.aaaai.org 2. Food Allergy Research and Education (FARE): foodallergy.org 3. Mothers of Asthmatics: http://www.asthmacommunitynetwork.org 4. American College of Allergy, Asthma, and Immunology: www.acaai.org      "Like" us  on Facebook and Instagram for our latest updates!      A healthy democracy works best when [employer] participate! Make sure you are registered to vote! If you have moved or changed any of your contact information, you will need to get this updated before voting! Scan the QR codes below to learn more!            Subjective:   Emily [name] is a 23 y.o. female presenting today for follow up of  Chief Complaint  Patient presents with  . Follow-up  . Asthma    She would like to Tezspire  back.    Emily [name] has a history of the following: Patient Active Problem List   Diagnosis Date Noted  . Recurrent major depression resistant to treatment 04/17/2021  . Essential hypertension 04/17/2021  . Acne rosacea 01/17/2021  . Elevated C-reactive protein (CRP) 12/11/2020  . Elevated sed rate 12/11/2020  . Arthralgia 11/27/2020  .  Social anxiety disorder 08/02/2020  . B12 deficiency 06/05/2020  . Dry eye 05/25/2020  . Vitamin D  deficiency 05/25/2020  . Epistaxis, recurrent 01/05/2020  . Ankle weakness 12/01/2019  . Mixed hyperlipidemia 12/01/2019  . Paresthesia of skin  05/27/2019  . BMI 50.0-59.9, adult (HCC) 05/12/2019  . Chronic fatigue 05/12/2019  . Chronic left-sided low back pain without sciatica 12/02/2017  . Interstitial cystitis (chronic) with hematuria 11/26/2017  . 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 Primary hypertension 03/25/2017  . Midline thoracic back pain 09/27/2015  . Acne vulgaris 09/27/2015  . Anorexia nervosa, binge-eating purging type 04/11/2015  . Dysmenorrhea 02/27/2015  . Sleep disturbance 11/18/2014  . Attention deficit hyperactivity disorder (ADHD), combined type 08/24/2014    History obtained from: chart review and {Persons; PED relatives w/patient:19415::patient}.  Discussed the use of AI scribe software for clinical note transcription with the patient and/or guardian, who gave verbal consent to proceed.  Emily [name] is a 23 y.o. female presenting for {Blank single:19197::a food challenge,a drug challenge,skin testing,a sick visit,an evaluation of ***,a follow up visit}. She was last seen in February 2025. At that time, lung testing looked good. We continued with Breztri  two puffs twice daily and continued with Tezspire  monthly. We started her on that medication at the last visit. For her allergic rhinitis, we continued with Nasacort  as well as levocetirizine.  Since the last visit, she has done well.   Asthma/Respiratory Symptom History: ***  Allergic Rhinitis Symptom History: ***  Food Allergy Symptom History: ***  Skin Symptom History: ***  GERD Symptom History: ***  Infection Symptom History: ***  Otherwise, there have been no changes to her past medical history, surgical history, family history, or social history.    Review of systems otherwise negative other than that mentioned in the HPI.    Objective:   Blood pressure 110/80, pulse 90, temperature 98.2 F (36.8 C), temperature source Temporal, resp. rate 16, height 5' 4 (1.626 m), weight 280 lb 12.8 oz (127.4 kg), SpO2 96%. Body  mass index is 48.2 kg/m.    Physical Exam   Diagnostic studies: {Blank single:19197::none,deferred due to recent antihistamine use,deferred due to insurance stipulations that require a separate visit for testing,labs sent instead, }  Spirometry: results normal (FEV1: 2.64/86%, FVC: 4.40/124%, FEV1/FVC: 60%).    Spirometry consistent with normal pattern. {Blank single:19197::Albuterol /Atrovent  nebulizer,Xopenex/Atrovent  nebulizer,Albuterol  nebulizer,Albuterol  four puffs via MDI,Xopenex four puffs via MDI} treatment given in clinic with {Blank single:19197::significant improvement in FEV1 per ATS criteria,significant improvement in FVC per ATS criteria,significant improvement in FEV1 and FVC per ATS criteria,improvement in FEV1, but not significant per ATS criteria,improvement in FVC, but not significant per ATS criteria,improvement in FEV1 and FVC, but not significant per ATS criteria,no improvement}.  Allergy Studies: {Blank single:19197::none,deferred due to recent antihistamine use,deferred due to insurance stipulations that require a separate visit for testing,labs sent instead, }    {Blank single:19197::Allergy testing results were read and interpreted by myself, documented by clinical staff., }      Marty Shaggy, MD  [facility] 

## 2023-10-09 ENCOUNTER — Telehealth: Payer: Self-pay | Admitting: *Deleted

## 2023-10-09 ENCOUNTER — Encounter: Attending: Physical Medicine & Rehabilitation | Admitting: Physical Medicine & Rehabilitation

## 2023-10-09 ENCOUNTER — Encounter: Payer: Self-pay | Admitting: Physical Medicine & Rehabilitation

## 2023-10-09 VITALS — BP 106/73 | HR 112 | Ht 64.0 in | Wt 281.0 lb

## 2023-10-09 DIAGNOSIS — M722 Plantar fascial fibromatosis: Secondary | ICD-10-CM | POA: Diagnosis present

## 2023-10-09 DIAGNOSIS — M797 Fibromyalgia: Secondary | ICD-10-CM | POA: Diagnosis present

## 2023-10-09 MED ORDER — PREGABALIN 150 MG PO CAPS: 150.0000 mg | ORAL_CAPSULE | Freq: Two times a day (BID) | ORAL | 6 refills | Status: DC

## 2023-10-09 NOTE — Telephone Encounter (Signed)
 L/m for patient to contact me to advise approval, copay card and submit to [employer] for [employer]

## 2023-10-09 NOTE — Progress Notes (Signed)
 Subjective:    Patient ID: Emily [name], female    DOB: Dec 21, 2000, 23 y.o.   MRN: [id number]  HPI  HPI 11/25/2021  Emily [name] is a 23 y.o. year old female  who  has a past medical history of ADHD (attention deficit hyperactivity disorder), Anorexia, Anxiety, Asthma, B12 deficiency, Bulimia, Depression, Morbid obesity (HCC), and Treatment-resistant depression.   They are presenting to PM&R clinic as a new patient for pain management evaluation.  Emily [name] has pain that is worse in her back that is stabbing aching in quality.  She has had this pain for about 8 years.  Pain interferes with her sleep.  While her back is the worst source of her pain she has pain throughout her entire body.  Gentle stretching will improve her pain.   Pain is worsened by sitting.  Pain is also increased after she stands for several minutes.  Her activity is decreased due to the pain.   She reports she would like to avoid narcotic pain medications if possible.  She reports that she has had family members who have been addicted to these types of medications.   She has been followed by rheumatology due to ESR and CRP elevations however other tests have been negative.  It does not appear that she has yet been diagnosed with any specific rheumatological condition.  She has a lot of fatigue and has insomnia with significant difficulty falling and staying asleep.  She will often have paresthesias in her arms and legs.  She has a history of depression and is on Cymbalta .  She also had TMS in September with benefit to her mood.  No SI or HI.  No bowel or bladder changes although she has chronic interstitial cystitis.     Medications tried: -Duloxetine  60mg  BID - currently using for mood -Flexeril - helped at first, not much now -Naproxone-helped at first, not much now -Tylenol - no benefit -Ibuprofen - no benefit -Oxycodone  for foot surgery helped her foot pain at the time -Prednisione used in the the  past with limited benefit   Other treatments: Physical therapy did not provide significant benefit Lyrica  helping Weaning off duloxetine  to prestiq     Interval History 01/17/22 Medicine while in house is here for follow-up regarding her chronic pain.  She reports needing mild improvement in her overall pain since starting Lyrica .  She has not had any significant side effects with the medication.  Her mental health provider is planning to change her from duloxetine  to a different medication.  She has not tried a TENS unit previously.   Interval History 03/19/21 Emily [name] is here for her chronic pain follow-up.  She reports the pain in her hips and lower back is doing better with Lyrica .  She has less soreness in her arms and legs.  She continues to have occasional shooting pain down the legs.  She denies any side effects with Lyrica .  She continues to have pain around her neck and traps.  Aleve  does provide benefit to her pain.  She has been attempting to work on home exercise with walking, she thinks this is beneficial.   Interval History 03/19/21 Patient is here for trigger point injection to her trapezius muscles where she was noted to have several trigger points.  Patient reports Lyrica  continues to improve her pain and not causing any significant side effects.  She has tried Thera cane however she finds it is too painful.  We discussed    Interval history  08/08/2022 Emily [name] is here for follow-up regarding her chronic pain.  She feels like pain in her upper back and trapezius muscles is better since having trigger point injections completed.  She did note that injections were more expensive than she expected.  She reports that Lyrica  is helping her pain in her lower back and hips.  Overall pain is much more tolerable than it was several months ago.  Patient continues to have some pain in her feet, reports history of plantar fasciitis and history of left ankle surgery.     Interval  History 11/20/22 Patient reports that her leg and lower back pain is overall doing better.  She said she was seen by podiatry who did injections to both of her feet, started braces and advised better shoes.  She reports that her feet pain is much improved allowing her to walk better.  As she is walking better than pain in her lower extremities has been improving.  She continues to have pain primarily in her periscapular muscles.  She has a burning sensation in this location.  Pain is improved with Lyrica  however she continues to be uncomfortable.  She reports she has a lot of stressors right now that are sometimes negatively impacting her mood.   Interval history 02/27/2023 Patient is here for follow-up regarding her chronic body wide pain.  Patient reports that Lyrica  is helping control her lower back pain and overall aches and pains in her arms and legs.  If she stops taking Lyrica  she notices a significant increase in her overall pain.  She continues to have a lot of tenderness and pain in her periscapular muscles bilaterally.  She has been having some intermittent bilateral knee pain, she feels may be associated with the weather.  She recently had plantar fasciitis surgery on her right foot.  She reports that right foot pain has improved after the surgery.  She is considering having a similar surgery on her left foot.  She was scheduled to try aquatic therapy, delayed due to her foot surgery.  She reports her health insurance may change to a different company shortly.  Interval history 10/07/23 Follow-up for fibromyalgia. Reports a recent, significant increase in stiffness, particularly in the mornings. Describes having to shuffle and being bent over upon waking. The stiffness lasts for up to a couple of hours. Experiences diffuse body pain, which is affecting physical activity levels. Reports knee pain upon standing after sitting. Denies ankle or hip pain, but notes a sensation of popping in the knees  in the morning. States the pain and stiffness make it difficult to be active.  Currently taking Lyrica  twice daily, which helps with the achiness but not the stiffness.  Expresses frustration and feeling down about the symptoms. Concerned about the possibility of arthritis at 23 years old. Reports weight loss.  Discussed financial constraints regarding physical therapy, noting it is expensive.  Interval history 10/09/23 Follow-up for fibromyalgia. Reports a recent, significant increase in stiffness, particularly in the mornings. Describes having to shuffle and being bent over upon waking. The stiffness lasts for up to a couple of hours. Experiences diffuse body pain, which is affecting physical activity levels. Reports knee pain upon standing after sitting. Denies significant ankle or hip pain, but notes a sensation of popping in the knees in the morning. States the pain and stiffness make it difficult to be active.  Currently taking Lyrica  twice daily, which helps with the achiness but not the stiffness.  Expresses frustration about the  symptoms. Concerned about the possibility of arthritis at 23 years old. Reports weight loss.  Discussed financial constraints regarding physical therapy, noting it is expensive.  Pain Inventory Average Pain 7 Pain Right Now 8 My pain is constant, burning, and aching  In the last 24 hours, has pain interfered with the following? General activity 8 Relation with others 6 Enjoyment of life 8 What TIME of day is your pain at its worst? morning  and daytime Sleep (in general) Fair  Pain is worse with: walking, bending, sitting, and standing Pain improves with: medication Relief from Meds: 5  Family History  Problem Relation Age of Onset   Anxiety disorder Mother    Drug abuse Father    Hyperlipidemia Maternal Grandmother    Hypertension Maternal Grandmother    Hyperlipidemia Maternal Grandfather    Hypertension Maternal Grandfather    Colon  cancer Neg Hx    Pancreatic cancer Neg Hx    Esophageal cancer Neg Hx    Social History   Socioeconomic History   Marital status: Single    Spouse name: Not on file   Number of children: Not on file   Years of education: Not on file   Highest education level: Not on file  Occupational History   Not on file  Tobacco Use   Smoking status: Never    Passive exposure: Yes   Smokeless tobacco: Never   Tobacco comments:    mother smokes outside home  Vaping Use   Vaping status: Never Used  Substance and Sexual Activity   Alcohol use: Yes    Comment: occasional   Drug use: No   Sexual activity: Never    Partners: Male    Comment: Female partners  Other Topics Concern   Not on file  Social History Narrative   Not on file   Social Drivers of Health   Financial Resource Strain: Low Risk  (02/01/2023)   Received from [facility]   Overall Financial Resource Strain (CARDIA)    Difficulty of Paying Living Expenses: Not hard at all  Food Insecurity: No Food Insecurity (02/01/2023)   Received from [facility]   Hunger Vital Sign    Within the past 12 months, you worried that your food would run out before you got the money to buy more.: Never true    Within the past 12 months, the food you bought just didn't last and you didn't have money to get more.: Never true  Transportation Needs: No Transportation Needs (02/01/2023)   Received from [facility] - Transportation    Lack of Transportation (Medical): No    Lack of Transportation (Non-Medical): No  Physical Activity: Not on file  Stress: Not on file  Social Connections: Unknown (07/30/2022)   [facility]   Social Network    Social Network: Not on file   Past Surgical History:  Procedure Laterality Date   ADENOIDECTOMY     BLADDER REPAIR     LIGAMENT REPAIR     TONSILLECTOMY     Past Surgical History:  Procedure Laterality Date   ADENOIDECTOMY     BLADDER REPAIR     LIGAMENT REPAIR      TONSILLECTOMY     Past Medical History:  Diagnosis Date   ADHD (attention deficit hyperactivity disorder)    Anorexia    Anxiety    Asthma    B12 deficiency    Bulimia    Depression    Morbid obesity (HCC)  Treatment-resistant depression    BP 106/73   Pulse (!) 112   Ht 5' 4 (1.626 m)   Wt 281 lb (127.5 kg)   SpO2 96%   BMI 48.23 kg/m   Opioid Risk Score:   Fall Risk Score:  `1  Depression screen PHQ 2/9     10/09/2023    3:02 PM 02/27/2023    3:42 PM 11/20/2022    1:50 PM 08/08/2022    3:01 PM 06/03/2022    1:13 PM 04/17/2022    3:14 PM 03/20/2022    3:22 PM  Depression screen PHQ 2/9  Decreased Interest 1 0 2 0 0 1 2  Down, Depressed, Hopeless 1 0 2 0 0 1 2  PHQ - 2 Score 2 0 4 0 0 2 4     Review of Systems  Musculoskeletal:  Positive for back pain.       Pain in both shoulders, lower buttock - back  All other systems reviewed and are negative.      Objective:   Physical Exam   Gen: no distress, normal appearing HEENT: oral mucosa pink and moist, NCAT Abd: soft, non-distended Ext: no edema Psych: pleasant, normal affect Skin: intact Neuro: Alert and awake, follows commands, sensation intact light touch in all 4 extremities, cranial nerves II through XII grossly intact Moving all 4 extremities to gravity and resistance  Musculoskeletal: On examination, tenderness to palpation noted throughout arms and legs. Also tender C spine and L spine paraspinal muscles. Mild crepitus was heard on b/l knee flexion. No visible swelling of the joints   10/13/21 CLINICAL DATA:  Low back pain. 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 Low back pain radiating to both legs, worsening over the last year.   EXAM: MRI LUMBAR SPINE WITHOUT AND WITH CONTRAST   TECHNIQUE: Multiplanar and multiecho pulse sequences of the lumbar spine were obtained without and with intravenous contrast.   CONTRAST:  10 cc view way.   COMPARISON:  Radiography 02/13/2021   FINDINGS: Segmentation:  Transitional lumbosacral anatomy. Transitional vertebra is labeled as S1.   Alignment:  Minimal scoliotic curvature convex to the left.   Vertebrae:  No fracture or focal bone lesion.   Conus medullaris and cauda equina: Conus extends to the L1 level. Conus and cauda equina appear normal.   Paraspinal and other soft tissues: Normal   Disc levels:   Disc levels are normal. No disc degeneration, bulge or herniation. No stenosis of the canal or foramina. No advanced facet arthropathy. Very mild facet joint degeneration and enhancement at L5-S1.   IMPRESSION: 1. Transitional lumbosacral anatomy. Transitional vertebra is labeled as S1. 2. Minimal scoliotic curvature convex to the left. 3. No disc degeneration or herniation. No stenosis of the canal or foramina. 4. Very mild facet degeneration and enhancement at L5-S1. This could relate to low back pain or referred facet syndrome pain.            Assessment & Plan:     Fibromyalgia/polyarthralgia -Currently suspect fibromyalgia is the most likely diagnosis.  She has had a extensive work-up by rheumatology and it does not appear that any abnormalities noted other than elevated ESR and CRP.  Presentation does not seem typical of polymyalgia rheumatica.  Unless alternative rheumatological condition is identified I think fibromyalgia is the most likely condition resulting in her body wide pain she also has associated depression and sleep disturbance. -Mental health provider changed Cymbalta  to different medication previously -Continue Lyrica  to 150mg  BID, reordered -Continue low impact gradual progressive  exercise- limited by asthma -Continue use of Theracane -Could consider shockwave therapy if pain worsens, however could be cost limited -Patient reports trigger point injections were helpful however her cost limited -Physical therapy/aquatic therapy.  Limited by cost -Initiate low-dose naltrexone (LDN). Will start at 1mg  daily,  titrating up every 2-3 weeks to 2mg , then 3mg  daily. Prescription to be called into [facility]. Discussed that it may take a few months to see full effect. Counseled on potential side effects including mood changes or nausea, and to stop the medication if these occur. Plan to transition to a single, higher-dose capsule for cost savings if effective. -Provided a coupon card for Journavx for possible short-term use for severe pain flares as a backup option. Discussed it is a non-opioid approved for acute pain. Not ordered today per pt preference. -Advise TENS unit, next wave was expensive for her however she could purchase device over-the-counter -Tai Chi recommended -Recommended trying Tai Chi for gentle exercise. 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  Depression, denies HI or SI -continue f/u with mental health provider

## 2023-10-09 NOTE — Telephone Encounter (Signed)
-----   Message from Marty Morton Shaggy sent at 10/08/2023  6:12 AM EDT ----- Walterine Milling! She is still interested in Tezspire  - apparently her insurance changed. Fun times.SABRASABRA

## 2023-10-19 NOTE — Telephone Encounter (Signed)
L/m for patient to reach out to me again

## 2023-10-21 NOTE — Telephone Encounter (Signed)
 Great thank you!

## 2023-10-21 NOTE — Telephone Encounter (Signed)
 Spoke to patient and advised submit to [employer] will reach out once delivery set to make appt to start therapy since she wants to get injs in clinic

## 2023-11-02 ENCOUNTER — Telehealth: Payer: Self-pay | Admitting: *Deleted

## 2023-11-02 NOTE — Telephone Encounter (Signed)
 L/m for patient Emily [name]  delivery for 10/21 can reach out to clinic to schedule appt to start therapy in clinic.

## 2023-11-05 ENCOUNTER — Encounter: Payer: Self-pay | Admitting: Podiatry

## 2023-11-05 ENCOUNTER — Ambulatory Visit

## 2023-11-05 ENCOUNTER — Institutional Professional Consult (permissible substitution) (INDEPENDENT_AMBULATORY_CARE_PROVIDER_SITE_OTHER): Admitting: Podiatry

## 2023-11-05 ENCOUNTER — Institutional Professional Consult (permissible substitution): Admitting: Podiatry

## 2023-11-05 DIAGNOSIS — M722 Plantar fascial fibromatosis: Secondary | ICD-10-CM | POA: Diagnosis not present

## 2023-11-05 DIAGNOSIS — J4540 Moderate persistent asthma, uncomplicated: Secondary | ICD-10-CM

## 2023-11-05 MED ORDER — TEZEPELUMAB-EKKO 210 MG/1.91ML ~~LOC~~ SOSY
210.0000 mg | PREFILLED_SYRINGE | Freq: Once | SUBCUTANEOUS | Status: AC
  Administered 2023-11-05: 210 mg via SUBCUTANEOUS

## 2023-11-06 NOTE — Progress Notes (Signed)
 Subjective:   Patient ID: Emily [name], female   DOB: 23 y.o.   MRN: [id number]   HPI Patient presents for correction of the left heel stating that the right 1 has done very well and that she is very pleased at this point   ROS      Objective:  Physical Exam  Neurovascular status intact with exquisite discomfort left plantar fascia at the insertion of the tendon into the calcaneus with fluid buildup noted and right that is doing very well with previous surgery     Assessment:  Acute plantar fasciitis left improved right from surgery     Plan:  H&P reviewed at this point I allowed her to read and signed consent form going over all possible complications with surgery and the fact there is no long-term guarantees.  Patient wants surgery and after review signed consent form understanding to complete recovery takes around 4 to 6 months and cannot guarantee she will not develop arch pain or other discomforts.  Patient was given all preoperative instructions today

## 2023-11-09 ENCOUNTER — Telehealth: Payer: Self-pay | Admitting: Podiatry

## 2023-11-09 NOTE — Telephone Encounter (Signed)
 Called patient to confirm 01/05/24 surgery date. Patient not on any blood thinner/GLP1 medications. Patient aware [facility] will call 24-48 hours prior to surgery with arrival time. Patient has confirmed receipt of surgery bag. She is missing the scrub sponge so I advised her to come into the office to pick one up. Patients preferred pharmacy has been set in chart.

## 2023-12-02 ENCOUNTER — Ambulatory Visit: Admitting: Obstetrics and Gynecology

## 2023-12-02 DIAGNOSIS — L814 Other melanin hyperpigmentation: Secondary | ICD-10-CM | POA: Insufficient documentation

## 2023-12-02 DIAGNOSIS — L304 Erythema intertrigo: Secondary | ICD-10-CM | POA: Insufficient documentation

## 2023-12-03 ENCOUNTER — Ambulatory Visit

## 2023-12-04 ENCOUNTER — Ambulatory Visit

## 2023-12-30 ENCOUNTER — Ambulatory Visit

## 2023-12-30 DIAGNOSIS — J4540 Moderate persistent asthma, uncomplicated: Secondary | ICD-10-CM | POA: Diagnosis not present

## 2023-12-30 MED ORDER — TEZEPELUMAB-EKKO 210 MG/1.91ML ~~LOC~~ SOSY
210.0000 mg | PREFILLED_SYRINGE | Freq: Once | SUBCUTANEOUS | Status: AC
  Administered 2023-12-30: 09:00:00 210 mg via SUBCUTANEOUS

## 2024-01-01 ENCOUNTER — Telehealth: Payer: Self-pay | Admitting: Podiatry

## 2024-01-01 NOTE — Telephone Encounter (Signed)
 Contacted patient about rescheduling surgery due to ins approval being denied. Peer to peer set for Monday 01/04/24. Patient aware that it may need to be rescheduled if peer to peer not approved.

## 2024-01-11 ENCOUNTER — Encounter: Admitting: Podiatry

## 2024-01-11 ENCOUNTER — Encounter

## 2024-01-11 ENCOUNTER — Telehealth: Payer: Self-pay | Admitting: Podiatry

## 2024-01-11 MED ORDER — HYDROCODONE-ACETAMINOPHEN 10-325 MG PO TABS: 1.0000 | ORAL_TABLET | Freq: Three times a day (TID) | ORAL | 0 refills | Status: AC | PRN

## 2024-01-11 NOTE — Telephone Encounter (Deleted)
 DOS- 01/12/2024  ENDOSCOPIC PLANTAR FASCIOTOMY LT- 70106  [employer] EFFECTIVE DATE- 03/14/2023  DEDUCTIBLE- N/A OOP- $9000 REMAINING- $0  PER [employer] PORTAL, PRIOR AUTH FOR CPT CODE 70106 HAS BEEN APPROVED FROM 01/12/2024-01/12/2024. AUTH# [id number]

## 2024-01-11 NOTE — Addendum Note (Signed)
 Addended by: MAGDALEN PASCO RAMAN on: 01/11/2024 11:05 PM   Modules accepted: Orders

## 2024-01-11 NOTE — Telephone Encounter (Signed)
 DOS- 01/12/2024   ENDOSCOPIC PLANTAR FASCIOTOMY LT- 70106   [employer] EFFECTIVE DATE- 03/14/2023   DEDUCTIBLE- N/A OOP- $9000 REMAINING- $0   PER [employer] PORTAL, PRIOR AUTH FOR CPT CODE 70106 HAS BEEN APPROVED FROM 01/05/2024-03/12/2024. AUTH# [id number]

## 2024-01-12 DIAGNOSIS — M722 Plantar fascial fibromatosis: Secondary | ICD-10-CM | POA: Diagnosis not present

## 2024-01-18 ENCOUNTER — Encounter: Attending: Physical Medicine & Rehabilitation | Admitting: Physical Medicine & Rehabilitation

## 2024-01-18 ENCOUNTER — Encounter

## 2024-01-18 ENCOUNTER — Encounter: Payer: Self-pay | Admitting: Podiatry

## 2024-01-18 ENCOUNTER — Encounter: Payer: Self-pay | Admitting: Physical Medicine & Rehabilitation

## 2024-01-18 VITALS — BP 97/70 | HR 100 | Ht 64.0 in | Wt 293.0 lb

## 2024-01-18 VITALS — BP 119/70 | HR 97 | Temp 99.7°F

## 2024-01-18 DIAGNOSIS — M791 Myalgia, unspecified site: Secondary | ICD-10-CM | POA: Diagnosis present

## 2024-01-18 DIAGNOSIS — F329 Major depressive disorder, single episode, unspecified: Secondary | ICD-10-CM | POA: Diagnosis not present

## 2024-01-18 DIAGNOSIS — M797 Fibromyalgia: Secondary | ICD-10-CM | POA: Insufficient documentation

## 2024-01-18 DIAGNOSIS — M722 Plantar fascial fibromatosis: Secondary | ICD-10-CM

## 2024-01-18 MED ORDER — PREGABALIN 150 MG PO CAPS: 150.0000 mg | ORAL_CAPSULE | Freq: Two times a day (BID) | ORAL | 6 refills | Status: AC

## 2024-01-18 MED ORDER — NONFORMULARY OR COMPOUNDED ITEM: 2 refills | Status: AC

## 2024-01-18 MED ORDER — NONFORMULARY OR COMPOUNDED ITEM: 1.0000 | Freq: Every day | 2 refills | Status: DC

## 2024-01-18 NOTE — Progress Notes (Signed)
 "  Subjective:    Patient ID: Emily [name], female    DOB: [birth year], 24 y.o.   MRN: [id number]  HPI  HPI 11/25/2021  Emily [name] is a 24 y.o. year old female  who  has a past medical history of ADHD (attention deficit hyperactivity disorder), Anorexia, Anxiety, Asthma, B12 deficiency, Bulimia, Depression, Morbid obesity (HCC), and Treatment-resistant depression.   They are presenting to PM&R clinic as a new patient for pain management evaluation.  Emily [name] has pain that is worse in her back that is stabbing aching in quality.  She has had this pain for about 8 years.  Pain interferes with her sleep.  While her back is the worst source of her pain she has pain throughout her entire body.  Gentle stretching will improve her pain.   Pain is worsened by sitting.  Pain is also increased after she stands for several minutes.  Her activity is decreased due to the pain.   She reports she would like to avoid narcotic pain medications if possible.  She reports that she has had family members who have been addicted to these types of medications.   She has been followed by rheumatology due to ESR and CRP elevations however other tests have been negative.  It does not appear that she has yet been diagnosed with any specific rheumatological condition.  She has a lot of fatigue and has insomnia with significant difficulty falling and staying asleep.  She will often have paresthesias in her arms and legs.  She has a history of depression and is on Cymbalta .  She also had TMS in September with benefit to her mood.  No SI or HI.  No bowel or bladder changes although she has chronic interstitial cystitis.     Medications tried: -Duloxetine  60mg  BID - currently using for mood -Flexeril - helped at first, not much now -Naproxone-helped at first, not much now -Tylenol - no benefit -Ibuprofen - no benefit -Oxycodone  for foot surgery helped her foot pain at the time -Prednisione used in the the  past with limited benefit   Other treatments: Physical therapy did not provide significant benefit Lyrica  helping Weaning off duloxetine  to prestiq     Interval History 01/17/22 Medicine while in house is here for follow-up regarding her chronic pain.  She reports needing mild improvement in her overall pain since starting Lyrica .  She has not had any significant side effects with the medication.  Her mental health provider is planning to change her from duloxetine  to a different medication.  She has not tried a TENS unit previously.   Interval History 03/19/21 Emily [name] is here for her chronic pain follow-up.  She reports the pain in her hips and lower back is doing better with Lyrica .  She has less soreness in her arms and legs.  She continues to have occasional shooting pain down the legs.  She denies any side effects with Lyrica .  She continues to have pain around her neck and traps.  Aleve  does provide benefit to her pain.  She has been attempting to work on home exercise with walking, she thinks this is beneficial.   Interval History 03/19/21 Patient is here for trigger point injection to her trapezius muscles where she was noted to have several trigger points.  Patient reports Lyrica  continues to improve her pain and not causing any significant side effects.  She has tried Thera cane however she finds it is too painful.  We discussed    Interval  history 08/08/2022 Emily [name] is here for follow-up regarding her chronic pain.  She feels like pain in her upper back and trapezius muscles is better since having trigger point injections completed.  She did note that injections were more expensive than she expected.  She reports that Lyrica  is helping her pain in her lower back and hips.  Overall pain is much more tolerable than it was several months ago.  Patient continues to have some pain in her feet, reports history of plantar fasciitis and history of left ankle surgery.     Interval  History 11/20/22 Patient reports that her leg and lower back pain is overall doing better.  She said she was seen by podiatry who did injections to both of her feet, started braces and advised better shoes.  She reports that her feet pain is much improved allowing her to walk better.  As she is walking better than pain in her lower extremities has been improving.  She continues to have pain primarily in her periscapular muscles.  She has a burning sensation in this location.  Pain is improved with Lyrica  however she continues to be uncomfortable.  She reports she has a lot of stressors right now that are sometimes negatively impacting her mood.   Interval history 02/27/2023 Patient is here for follow-up regarding her chronic body wide pain.  Patient reports that Lyrica  is helping control her lower back pain and overall aches and pains in her arms and legs.  If she stops taking Lyrica  she notices a significant increase in her overall pain.  She continues to have a lot of tenderness and pain in her periscapular muscles bilaterally.  She has been having some intermittent bilateral knee pain, she feels may be associated with the weather.  She recently had plantar fasciitis surgery on her right foot.  She reports that right foot pain has improved after the surgery.  She is considering having a similar surgery on her left foot.  She was scheduled to try aquatic therapy, delayed due to her foot surgery.  She reports her health insurance may change to a different company shortly.  Interval history 10/07/23 Follow-up for fibromyalgia. Reports a recent, significant increase in stiffness, particularly in the mornings. Describes having to shuffle and being bent over upon waking. The stiffness lasts for up to a couple of hours. Experiences diffuse body pain, which is affecting physical activity levels. Reports knee pain upon standing after sitting. Denies ankle or hip pain, but notes a sensation of popping in the knees  in the morning. States the pain and stiffness make it difficult to be active.  Currently taking Lyrica  twice daily, which helps with the achiness but not the stiffness.  Expresses frustration and feeling down about the symptoms. Concerned about the possibility of arthritis at 24 years old. Reports weight loss.  Discussed financial constraints regarding physical therapy, noting it is expensive.  Interval history 10/09/23 Follow-up for fibromyalgia. Reports a recent, significant increase in stiffness, particularly in the mornings. Describes having to shuffle and being bent over upon waking. The stiffness lasts for up to a couple of hours. Experiences diffuse body pain, which is affecting physical activity levels. Reports knee pain upon standing after sitting. Denies significant ankle or hip pain, but notes a sensation of popping in the knees in the morning. States the pain and stiffness make it difficult to be active.  Currently taking Lyrica  twice daily, which helps with the achiness but not the stiffness.  Expresses frustration about  the symptoms. Concerned about the possibility of arthritis at 24 years old. Reports weight loss.  Discussed financial constraints regarding physical therapy, noting it is expensive.  Interval history 01/18/24 The patient presents for a follow-up visit, accompanied by her mother. She reports recent surgery on her foot for plantar fasciitis and is currently recovering, with instructions to rest and elevate the foot. She notes that she overdid it last week.  She reports that the new medication (low-dose naltrexone) is starting to be effective for her pain. She has been taking two 1 mg pills daily since late October and feels it is helping. She has not increased the dose to three pills as planned due to new-onset stomach pain, which she is unsure is related to the medication. She denies any other side effects from the medication.  She continues to take Lyrica , which  helps with her hip pain but not her upper back pain.   Pain Inventory Average Pain 5 Pain Right Now 3 My pain is constant, burning, and aching  In the last 24 hours, has pain interfered with the following? General activity 3 Relation with others 4 Enjoyment of life 4 What TIME of day is your pain at its worst? evening and night Sleep (in general) Fair  Pain is worse with: sitting and inactivity Pain improves with: rest and medication Relief from Meds: 6  Family History  Problem Relation Age of Onset   Anxiety disorder Mother    Drug abuse Father    Hyperlipidemia Maternal Grandmother    Hypertension Maternal Grandmother    Hyperlipidemia Maternal Grandfather    Hypertension Maternal Grandfather    Colon cancer Neg Hx    Pancreatic cancer Neg Hx    Esophageal cancer Neg Hx    Social History   Socioeconomic History   Marital status: Single    Spouse name: Not on file   Number of children: Not on file   Years of education: Not on file   Highest education level: Not on file  Occupational History   Not on file  Tobacco Use   Smoking status: Never    Passive exposure: Yes   Smokeless tobacco: Never   Tobacco comments:    mother smokes outside home  Vaping Use   Vaping status: Never Used  Substance and Sexual Activity   Alcohol use: Yes    Comment: occasional   Drug use: No   Sexual activity: Never    Partners: Male    Comment: Female partners  Other Topics Concern   Not on file  Social History Narrative   Not on file   Social Drivers of Health   Tobacco Use: Low Risk (12/14/2023)   Received from [facility]   Patient History    Smoking Tobacco Use: Never    Smokeless Tobacco Use: Never    Passive Exposure: Past  Recent Concern: Tobacco Use - Medium Risk (11/05/2023)   Patient History    Smoking Tobacco Use: Never    Smokeless Tobacco Use: Never    Passive Exposure: Yes  Financial Resource Strain: Low Risk (12/01/2023)   Received from [facility]    Overall Financial Resource Strain (CARDIA)    How hard is it for you to pay for the very basics like food, housing, medical care, and heating?: Not hard at all  Food Insecurity: No Food Insecurity (12/01/2023)   Received from [facility]   Epic    Within the past 12 months, you worried that your food would run  out before you got the money to buy more.: Never true    Within the past 12 months, the food you bought just didn't last and you didn't have money to get more.: Never true  Transportation Needs: No Transportation Needs (12/01/2023)   Received from [facility]    In the past 12 months, has lack of transportation kept you from medical appointments or from getting medications?: No    In the past 12 months, has lack of transportation kept you from meetings, work, or from getting things needed for daily living?: No  Physical Activity: Insufficiently Active (12/01/2023)   Received from [facility]   Exercise Vital Sign    On average, how many days per week do you engage in moderate to strenuous exercise (like a brisk walk)?: 2 days    On average, how many minutes do you engage in exercise at this level?: 10 min  Stress: Stress Concern Present (12/01/2023)   Received from [facility] - Occupational Stress Questionnaire    Do you feel stress - tense, restless, nervous, or anxious, or unable to sleep at night because your mind is troubled all the time - these days?: Rather much  Social Connections: Moderately Integrated (12/01/2023)   Received from [facility]   Social Network    How would you rate your social network (family, work, friends)?: Adequate participation with social networks  Depression (PHQ2-9): Low Risk (10/09/2023)   Depression (PHQ2-9)    PHQ-2 Score: 2  Alcohol Screen: Not on file  Housing: Low Risk (12/01/2023)   Received from [facility]    In the last 12 months, was there a time when you were not able  to pay the mortgage or rent on time?: No    In the past 12 months, how many times have you moved where you were living?: 0    At any time in the past 12 months, were you homeless or living in a shelter (including now)?: No  Utilities: Not At Risk (12/01/2023)   Received from [facility]    In the past 12 months has the electric, gas, oil, or water company threatened to shut off services in your home?: No  Health Literacy: Not on file   Past Surgical History:  Procedure Laterality Date   ADENOIDECTOMY     BLADDER REPAIR     LIGAMENT REPAIR     TONSILLECTOMY     Past Surgical History:  Procedure Laterality Date   ADENOIDECTOMY     BLADDER REPAIR     LIGAMENT REPAIR     TONSILLECTOMY     Past Medical History:  Diagnosis Date   ADHD (attention deficit hyperactivity disorder)    Anorexia    Anxiety    Asthma    B12 deficiency    Bulimia (HCC)    Depression    Morbid obesity (HCC)    Treatment-resistant depression    There were no vitals taken for this visit.  Opioid Risk Score:   Fall Risk Score:  `1  Depression screen PHQ 2/9     10/09/2023    3:02 PM 02/27/2023    3:42 PM 11/20/2022    1:50 PM 08/08/2022    3:01 PM 06/03/2022    1:13 PM 04/17/2022    3:14 PM 03/20/2022    3:22 PM  Depression screen PHQ 2/9  Decreased Interest 1 0 2 0 0 1 2  Down, Depressed, Hopeless 1 0 2 0 0 1 2  PHQ - 2 Score 2 0 4 0 0 2 4     Review of Systems  Musculoskeletal:  Positive for back pain.       Pain in both shoulders, lower buttock - back  All other systems reviewed and are negative.      Objective:   Physical Exam   Gen: no distress, normal appearing HEENT: oral mucosa pink and moist, NCAT Abd: soft, non-distended Ext: no edema Psych: pleasant, normal affect Skin: intact Neuro: Alert and awake, follows commands, sensation intact light touch in all 4 extremities, cranial nerves II through XII grossly intact Moving all 4 extremities to gravity and  resistance  Musculoskeletal:  There is tenderness to palpation over the upper back and neck musculature.  The lower back is less tender in comparison.  Minimal arm and leg tenderness today. Trigger points noted in bilateral trapezius muscles with radiating pain to her shoulders. Wearing surgical boot on her left foot after plantar fasciitis surgery  10/13/21 CLINICAL DATA:  Low back pain. 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 Low back pain radiating to both legs, worsening over the last year.   EXAM: MRI LUMBAR SPINE WITHOUT AND WITH CONTRAST   TECHNIQUE: Multiplanar and multiecho pulse sequences of the lumbar spine were obtained without and with intravenous contrast.   CONTRAST:  10 cc view way.   COMPARISON:  Radiography 02/13/2021   FINDINGS: Segmentation: Transitional lumbosacral anatomy. Transitional vertebra is labeled as S1.   Alignment:  Minimal scoliotic curvature convex to the left.   Vertebrae:  No fracture or focal bone lesion.   Conus medullaris and cauda equina: Conus extends to the L1 level. Conus and cauda equina appear normal.   Paraspinal and other soft tissues: Normal   Disc levels:   Disc levels are normal. No disc degeneration, bulge or herniation. No stenosis of the canal or foramina. No advanced facet arthropathy. Very mild facet joint degeneration and enhancement at L5-S1.   IMPRESSION: 1. Transitional lumbosacral anatomy. Transitional vertebra is labeled as S1. 2. Minimal scoliotic curvature convex to the left. 3. No disc degeneration or herniation. No stenosis of the canal or foramina. 4. Very mild facet degeneration and enhancement at L5-S1. This could relate to low back pain or referred facet syndrome pain.            Assessment & Plan:     Fibromyalgia/polyarthralgia -Currently suspect fibromyalgia is the most likely diagnosis.  She has had a extensive work-up by rheumatology and it does not appear that any abnormalities noted other  than elevated ESR and CRP.  Presentation does not seem typical of polymyalgia rheumatica.  Unless alternative rheumatological condition is identified I think fibromyalgia is the most likely condition resulting in her body wide pain she also has associated depression and sleep disturbance. -Mental health provider changed Cymbalta  to different medication previously -Continue Lyrica  to 150mg  BID, reordered -Continue low impact gradual progressive exercise- limited by asthma -Continue use of Theracane -Could consider shockwave therapy if pain worsens, however could be cost limited -Patient reports trigger point injections were helpful however her cost limited -Physical therapy/aquatic therapy.  Limited by cost -A refill for low-dose naltrexone 1 mg tablets will be sent to the pharmacy. Advised to try taking one pill of low-dose naltrexone daily to assess for stomach upset. If medicine still tolerated and not felt to be cause of her GI discomfort, she can increase back to two pills daily and after few weeks can  go to 3 pills daily. Plan to transition to a single, higher-dose capsule for cost savings if effective. -Consider Journavx for possible short-term use for severe pain flares as a backup option-discussed with patient previously  -Advise TENS until -Tai Chi recommended -Discussed trigger point injections with lidocaine  as a potential option. The patient is interested but is concerned about cost, as a previous procedure resulted in a significant out-of-pocket expense. She would like to proceed with scheduling an appointment for this, as she says her deductible has been met for the year.  Bilateral plantar fasciitis - Had recent surgery on her left foot  Depression, denies HI or SI -continue f/u with mental health provider  Will schedule next available for trigger point injections "

## 2024-01-18 NOTE — Patient Instructions (Signed)

## 2024-01-18 NOTE — Progress Notes (Signed)
 Patient presents for post-op visit today, POV #1 DOS 01/12/2024 LT ENDOSCOPIC PLANTAR FASCIOTOMY  Pretty good. I did not rest as much as I should so I have had some pain.  Patient's mother is in room today with her. .  Notes: Patient denies fever, chills, body aches.  Vital Signs: Today's Vitals   01/18/24 1114  BP: 119/70  Pulse: 97  Temp: 99.7 F (37.6 C)  TempSrc: Oral  PainSc: 4   PainLoc: Foot      Radiographs: []  Taken [x]  Not taken  Surgical Site Assessment:  - Dressing:  [x]  Minimal dry blood, intact []  Reinforced   []  Changed     -Notes: n/a  - Incision:  [x]  CDI (clean, dry, intact)  [x]  Mild erythema  []  Drainage noted   -Notes: medial incision has maceration present  - Swelling:  []  None  [x]  Mild  []  Moderate   []  Significant     -Notes: n/a  - Bruising:  []  None  [x]  Present: posterior ankle   - Sutures/Staples:  []  None [x]  Intact  []  Removed Today  [x]  Plan to remove at next visit   -Cast/Splint/Pins: [x]  None []  Intact []  Removed Today []  Plan to remove at next visit []  Replaced  -Signs of infection:  [x]  None  []  Present - Describe: n/a  -DME:    []  None [x]  AFW []  Surgical shoe []  Cast  []  Splint  -Walking status:  [x]  Full WB  []  Partial WB  []  NWB  -Utilizing device:  [x]  None []  Knee Scooter []  Crutches []  Wheelchair    DVT assessment:  [x]  Denies symptoms []  Chest pain/SOB []  Pain in calf/redness/warmth   Redressed DSD and ace wrap. Educated on signs of infection, proper dressing care, pain management, and weight bearing status. Patient will contact provider with any new or worsening symptoms. The provider assessed the patient today and reviewed instructions regarding plan of care.  Attestation: I personally saw and evaluated the patient.  Directly supervised nurse [facility].  Patient progressing as expected postoperatively.  Continue protected weightbearing in cam boot.  Reevaluate in 2 weeks for likely suture removal.  Dr. Ethan Saddler, D.P.M.

## 2024-01-22 ENCOUNTER — Encounter: Payer: Self-pay | Admitting: Obstetrics and Gynecology

## 2024-01-22 ENCOUNTER — Ambulatory Visit: Admitting: Obstetrics and Gynecology

## 2024-01-22 VITALS — BP 102/76 | HR 99

## 2024-01-22 DIAGNOSIS — N3010 Interstitial cystitis (chronic) without hematuria: Secondary | ICD-10-CM | POA: Diagnosis not present

## 2024-01-22 DIAGNOSIS — N3281 Overactive bladder: Secondary | ICD-10-CM

## 2024-01-22 MED ORDER — HYDROXYZINE PAMOATE 50 MG PO CAPS: 50.0000 mg | ORAL_CAPSULE | Freq: Every evening | ORAL | 3 refills | Status: AC

## 2024-01-22 MED ORDER — TROSPIUM CHLORIDE 20 MG PO TABS: 20.0000 mg | ORAL_TABLET | Freq: Two times a day (BID) | ORAL | 5 refills | Status: AC

## 2024-01-22 NOTE — Progress Notes (Signed)
 [facility] Return Visit  SUBJECTIVE  History of Present Illness: Emily [name] is a 24 y.o. female seen in follow-up for IC and OAB. Plan at last visit was increase in vesicare  to 10mg  daily and use Hydroxyzine  for nightly IC support.  Patient reports variable results with the vesicare . She states some days it feels like it works too well and she'll go 6 hours without voiding, and some days it feels like she is up 6 times at nighttime to urinate.    Patient is planning to start back at school this semester. She reports her depression has still been difficult but her SI is under control and she sees a therapist she has really meshed well with.    Past Medical History: Patient  has a past medical history of ADHD (attention deficit hyperactivity disorder), Anorexia, Anxiety, Asthma, B12 deficiency, Bulimia (HCC), Depression, Morbid obesity (HCC), and Treatment-resistant depression.   Past Surgical History: She  has a past surgical history that includes Tonsillectomy; Adenoidectomy; Ligament repair; and Bladder repair.   Medications: She has a current medication list which includes the following prescription(s): albuterol , atomoxetine, cyclobenzaprine , desvenlafaxine, diclofenac , slynd, lisinopril , naproxen , NONFORMULARY OR COMPOUNDED ITEM, omeprazole , pregabalin , qelbree, rosuvastatin, solifenacin , spravato (84 mg dose), tezspire , trospium , vraylar, hydroxyzine , and [DISCONTINUED] pantoprazole .   Allergies: Patient is allergic to dust mite extract.   Social History: Patient  reports that she has never smoked. She has been exposed to tobacco smoke. She has never used smokeless tobacco. She reports current alcohol use. She reports that she does not use drugs.     OBJECTIVE     Physical Exam: Vitals:   01/22/24 0844  BP: 102/76  Pulse: 99   Gen: No apparent distress, A&O x 3.  Detailed Urogynecologic Evaluation:  Deferred.    ASSESSMENT AND PLAN    Ms.  [name] is a 24 y.o. with:  1. Overactive bladder   2. Interstitial cystitis    For patinet's overactive bladder, we discussed changing her medication to Trospium  20mg  x2 daily. We talked about starting with once daily and then if needed using it twice a day. She is open to this.  Patient denies any recent bladder pain flares. She inquired about a hydrodistention, but with her having no recent pain flares she should continue on her current regimen. She is also taking Ketamine weekly for her fibro as well as naltrexone. She is seeing physiotherapy. We discussed working with grounding techniques and noting to help keep her depression under control with her re-starting school. She is being brave and working towards her goal of finishing her degree and I support her in her endeavors to be successful.  Patient is currently getting BCP from an online company and reports it is fake progesterone.   Patient to follow up in 3 months for medication follow up or sooner if needed.    Aiman Sonn G Binnie Droessler, NP

## 2024-01-22 NOTE — Patient Instructions (Signed)
 Stop the vesicare  10mg  and start Trospium  20mg  x1 or x2 daily for bladder support.

## 2024-01-25 ENCOUNTER — Encounter

## 2024-01-27 ENCOUNTER — Ambulatory Visit

## 2024-02-01 ENCOUNTER — Ambulatory Visit

## 2024-02-01 ENCOUNTER — Ambulatory Visit: Admitting: Podiatry

## 2024-02-01 ENCOUNTER — Ambulatory Visit (INDEPENDENT_AMBULATORY_CARE_PROVIDER_SITE_OTHER)

## 2024-02-01 ENCOUNTER — Encounter

## 2024-02-01 DIAGNOSIS — J4540 Moderate persistent asthma, uncomplicated: Secondary | ICD-10-CM | POA: Diagnosis not present

## 2024-02-01 DIAGNOSIS — M722 Plantar fascial fibromatosis: Secondary | ICD-10-CM | POA: Diagnosis not present

## 2024-02-01 MED ORDER — TEZEPELUMAB-EKKO 210 MG/1.91ML ~~LOC~~ SOSY
210.0000 mg | PREFILLED_SYRINGE | Freq: Once | SUBCUTANEOUS | Status: AC
  Administered 2024-02-01: 210 mg via SUBCUTANEOUS

## 2024-02-01 NOTE — Progress Notes (Signed)
 Subjective:   Patient ID: Emily [name], female   DOB: 24 y.o.   MRN: [id number]   HPI Patient presents for removal of stitches left doing well   ROS      Objective:  Physical Exam  Neurovascular status intact negative T[place]a' sign noted left incision sites healed well     Assessment:  Doing well with endoscopic surgery left plantar fascia     Plan:  Stitches removed wound edges well coapted sterile dressing reapplied with bandages and ankle compression stocking dispensed.  Reappoint to recheck  Precautionary x-ray dated today indicated that there is no signs of depression of the arch with the procedure no signs of stress fracture

## 2024-02-15 ENCOUNTER — Encounter: Admitting: Physical Medicine & Rehabilitation

## 2024-02-29 ENCOUNTER — Ambulatory Visit

## 2024-03-04 ENCOUNTER — Encounter: Admitting: Physical Medicine & Rehabilitation

## 2024-04-05 ENCOUNTER — Ambulatory Visit: Admitting: Allergy & Immunology

## 2024-04-18 ENCOUNTER — Encounter: Admitting: Physical Medicine & Rehabilitation

## 2024-04-21 ENCOUNTER — Ambulatory Visit: Admitting: Obstetrics
